# Patient Record
Sex: Female | Born: 1945 | Race: Black or African American | Hispanic: No | Marital: Married | Smoking: Never smoker
Health system: Southern US, Community
[De-identification: ages and names within clinical notes are randomized; demographics above are authoritative.]

## PROBLEM LIST (undated history)

## (undated) DIAGNOSIS — IMO0002 Reserved for concepts with insufficient information to code with codable children: Secondary | ICD-10-CM

## (undated) DIAGNOSIS — M419 Scoliosis, unspecified: Secondary | ICD-10-CM

## (undated) DIAGNOSIS — M6530 Trigger finger, unspecified finger: Secondary | ICD-10-CM

## (undated) DIAGNOSIS — T7840XA Allergy, unspecified, initial encounter: Secondary | ICD-10-CM

## (undated) DIAGNOSIS — Z1239 Encounter for other screening for malignant neoplasm of breast: Secondary | ICD-10-CM

## (undated) DIAGNOSIS — J45909 Unspecified asthma, uncomplicated: Secondary | ICD-10-CM

## (undated) DIAGNOSIS — R519 Headache, unspecified: Secondary | ICD-10-CM

## (undated) DIAGNOSIS — E215 Disorder of parathyroid gland, unspecified: Secondary | ICD-10-CM

## (undated) DIAGNOSIS — Z87440 Personal history of urinary (tract) infections: Secondary | ICD-10-CM

## (undated) DIAGNOSIS — K219 Gastro-esophageal reflux disease without esophagitis: Secondary | ICD-10-CM

## (undated) DIAGNOSIS — Z8371 Family history of colonic polyps: Secondary | ICD-10-CM

## (undated) DIAGNOSIS — G629 Polyneuropathy, unspecified: Secondary | ICD-10-CM

## (undated) DIAGNOSIS — I10 Essential (primary) hypertension: Secondary | ICD-10-CM

## (undated) DIAGNOSIS — M1990 Unspecified osteoarthritis, unspecified site: Secondary | ICD-10-CM

## (undated) DIAGNOSIS — R42 Dizziness and giddiness: Secondary | ICD-10-CM

## (undated) DIAGNOSIS — Z83719 Family history of colon polyps, unspecified: Secondary | ICD-10-CM

## (undated) DIAGNOSIS — G4730 Sleep apnea, unspecified: Secondary | ICD-10-CM

## (undated) DIAGNOSIS — G2581 Restless legs syndrome: Secondary | ICD-10-CM

## (undated) HISTORY — DX: Family history of colonic polyps: Z83.71

## (undated) HISTORY — DX: Unspecified osteoarthritis, unspecified site: M19.90

## (undated) HISTORY — DX: Restless legs syndrome: G25.81

## (undated) HISTORY — DX: Disorder of parathyroid gland, unspecified: E21.5

## (undated) HISTORY — DX: Family history of colon polyps, unspecified: Z83.719

## (undated) HISTORY — DX: Essential (primary) hypertension: I10

## (undated) HISTORY — DX: Personal history of urinary (tract) infections: Z87.440

## (undated) HISTORY — DX: Encounter for other screening for malignant neoplasm of breast: Z12.39

## (undated) HISTORY — DX: Reserved for concepts with insufficient information to code with codable children: IMO0002

## (undated) HISTORY — DX: Gastro-esophageal reflux disease without esophagitis: K21.9

## (undated) HISTORY — DX: Allergy, unspecified, initial encounter: T78.40XA

## (undated) HISTORY — PX: TUBAL LIGATION: SHX77

## (undated) HISTORY — DX: Unspecified asthma, uncomplicated: J45.909

---
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
## 1982-09-23 HISTORY — PX: TEMPOROMANDIBULAR JOINT ARTHROPLASTY: SUR76

## 1985-09-23 DIAGNOSIS — Z87440 Personal history of urinary (tract) infections: Secondary | ICD-10-CM

## 1985-09-23 HISTORY — DX: Personal history of urinary (tract) infections: Z87.440

## 1998-09-23 HISTORY — PX: CARPAL TUNNEL RELEASE: SHX101

## 2004-07-27 ENCOUNTER — Ambulatory Visit: Payer: Self-pay | Admitting: Otolaryngology

## 2005-09-03 ENCOUNTER — Ambulatory Visit: Payer: Self-pay | Admitting: Family Medicine

## 2006-01-16 ENCOUNTER — Ambulatory Visit: Payer: Self-pay | Admitting: Family Medicine

## 2006-09-23 DIAGNOSIS — E215 Disorder of parathyroid gland, unspecified: Secondary | ICD-10-CM

## 2006-09-23 HISTORY — DX: Disorder of parathyroid gland, unspecified: E21.5

## 2006-09-23 HISTORY — PX: OTHER SURGICAL HISTORY: SHX169

## 2007-02-09 ENCOUNTER — Ambulatory Visit: Payer: Self-pay | Admitting: Family Medicine

## 2007-06-12 ENCOUNTER — Ambulatory Visit: Payer: Self-pay | Admitting: Family Medicine

## 2007-08-05 ENCOUNTER — Ambulatory Visit: Payer: Self-pay | Admitting: Family Medicine

## 2008-08-08 ENCOUNTER — Ambulatory Visit: Payer: Self-pay | Admitting: Family Medicine

## 2009-05-17 ENCOUNTER — Ambulatory Visit: Payer: Self-pay | Admitting: Specialist

## 2009-10-12 ENCOUNTER — Ambulatory Visit: Payer: Self-pay | Admitting: Family Medicine

## 2010-12-04 ENCOUNTER — Ambulatory Visit: Payer: Self-pay | Admitting: Family Medicine

## 2011-06-06 ENCOUNTER — Ambulatory Visit: Payer: Self-pay | Admitting: Family Medicine

## 2012-01-27 ENCOUNTER — Ambulatory Visit: Payer: Self-pay | Admitting: Family Medicine

## 2012-06-17 ENCOUNTER — Ambulatory Visit: Payer: Self-pay | Admitting: General Surgery

## 2012-06-17 HISTORY — PX: COLONOSCOPY: SHX174

## 2012-06-18 LAB — PATHOLOGY REPORT

## 2013-02-23 ENCOUNTER — Ambulatory Visit: Payer: Self-pay | Admitting: Family Medicine

## 2013-04-07 ENCOUNTER — Encounter: Payer: Self-pay | Admitting: *Deleted

## 2013-04-07 DIAGNOSIS — Z8371 Family history of colonic polyps: Secondary | ICD-10-CM | POA: Insufficient documentation

## 2013-12-09 ENCOUNTER — Ambulatory Visit: Payer: Self-pay | Admitting: Family Medicine

## 2014-09-08 ENCOUNTER — Ambulatory Visit: Payer: Self-pay | Admitting: Family Medicine

## 2015-03-23 ENCOUNTER — Ambulatory Visit: Payer: Commercial Managed Care - HMO | Admitting: Family Medicine

## 2015-04-24 ENCOUNTER — Encounter: Payer: Self-pay | Admitting: Family Medicine

## 2015-04-24 ENCOUNTER — Ambulatory Visit (INDEPENDENT_AMBULATORY_CARE_PROVIDER_SITE_OTHER): Payer: Commercial Managed Care - HMO | Admitting: Family Medicine

## 2015-04-24 VITALS — BP 144/80 | HR 95 | Temp 98.2°F | Resp 16 | Ht 61.0 in | Wt 145.4 lb

## 2015-04-24 DIAGNOSIS — M5440 Lumbago with sciatica, unspecified side: Secondary | ICD-10-CM

## 2015-04-24 DIAGNOSIS — M8949 Other hypertrophic osteoarthropathy, multiple sites: Secondary | ICD-10-CM | POA: Insufficient documentation

## 2015-04-24 DIAGNOSIS — R609 Edema, unspecified: Secondary | ICD-10-CM | POA: Diagnosis not present

## 2015-04-24 DIAGNOSIS — M159 Polyosteoarthritis, unspecified: Secondary | ICD-10-CM | POA: Insufficient documentation

## 2015-04-24 DIAGNOSIS — M545 Low back pain, unspecified: Secondary | ICD-10-CM | POA: Insufficient documentation

## 2015-04-24 DIAGNOSIS — M150 Primary generalized (osteo)arthritis: Secondary | ICD-10-CM

## 2015-04-24 DIAGNOSIS — I10 Essential (primary) hypertension: Secondary | ICD-10-CM | POA: Insufficient documentation

## 2015-04-24 MED ORDER — HYDROCHLOROTHIAZIDE 12.5 MG PO TABS: 12.5000 mg | ORAL_TABLET | Freq: Every day | ORAL | Status: DC

## 2015-04-24 NOTE — Patient Instructions (Signed)
DASH Eating Plan °DASH stands for "Dietary Approaches to Stop Hypertension." The DASH eating plan is a healthy eating plan that has been shown to reduce high blood pressure (hypertension). Additional health benefits may include reducing the risk of type 2 diabetes mellitus, heart disease, and stroke. The DASH eating plan may also help with weight loss. °WHAT DO I NEED TO KNOW ABOUT THE DASH EATING PLAN? °For the DASH eating plan, you will follow these general guidelines: °· Choose foods with a percent daily value for sodium of less than 5% (as listed on the food label). °· Use salt-free seasonings or herbs instead of table salt or sea salt. °· Check with your health care provider or pharmacist before using salt substitutes. °· Eat lower-sodium products, often labeled as "lower sodium" or "no salt added." °· Eat fresh foods. °· Eat more vegetables, fruits, and low-fat dairy products. °· Choose whole grains. Look for the word "whole" as the first word in the ingredient list. °· Choose fish and skinless chicken or turkey more often than red meat. Limit fish, poultry, and meat to 6 oz (170 g) each day. °· Limit sweets, desserts, sugars, and sugary drinks. °· Choose heart-healthy fats. °· Limit cheese to 1 oz (28 g) per day. °· Eat more home-cooked food and less restaurant, buffet, and fast food. °· Limit fried foods. °· Cook foods using methods other than frying. °· Limit canned vegetables. If you do use them, rinse them well to decrease the sodium. °· When eating at a restaurant, ask that your food be prepared with less salt, or no salt if possible. °WHAT FOODS CAN I EAT? °Seek help from a dietitian for individual calorie needs. °Grains °Whole grain or whole wheat bread. Brown rice. Whole grain or whole wheat pasta. Quinoa, bulgur, and whole grain cereals. Low-sodium cereals. Corn or whole wheat flour tortillas. Whole grain cornbread. Whole grain crackers. Low-sodium crackers. °Vegetables °Fresh or frozen vegetables  (raw, steamed, roasted, or grilled). Low-sodium or reduced-sodium tomato and vegetable juices. Low-sodium or reduced-sodium tomato sauce and paste. Low-sodium or reduced-sodium canned vegetables.  °Fruits °All fresh, canned (in natural juice), or frozen fruits. °Meat and Other Protein Products °Ground beef (85% or leaner), grass-fed beef, or beef trimmed of fat. Skinless chicken or turkey. Ground chicken or turkey. Pork trimmed of fat. All fish and seafood. Eggs. Dried beans, peas, or lentils. Unsalted nuts and seeds. Unsalted canned beans. °Dairy °Low-fat dairy products, such as skim or 1% milk, 2% or reduced-fat cheeses, low-fat ricotta or cottage cheese, or plain low-fat yogurt. Low-sodium or reduced-sodium cheeses. °Fats and Oils °Tub margarines without trans fats. Light or reduced-fat mayonnaise and salad dressings (reduced sodium). Avocado. Safflower, olive, or canola oils. Natural peanut or almond butter. °Other °Unsalted popcorn and pretzels. °The items listed above may not be a complete list of recommended foods or beverages. Contact your dietitian for more options. °WHAT FOODS ARE NOT RECOMMENDED? °Grains °Tatro bread. Terrance pasta. Mateus rice. Refined cornbread. Bagels and croissants. Crackers that contain trans fat. °Vegetables °Creamed or fried vegetables. Vegetables in a cheese sauce. Regular canned vegetables. Regular canned tomato sauce and paste. Regular tomato and vegetable juices. °Fruits °Dried fruits. Canned fruit in light or heavy syrup. Fruit juice. °Meat and Other Protein Products °Fatty cuts of meat. Ribs, chicken wings, bacon, sausage, bologna, salami, chitterlings, fatback, hot dogs, bratwurst, and packaged luncheon meats. Salted nuts and seeds. Canned beans with salt. °Dairy °Whole or 2% milk, cream, half-and-half, and cream cheese. Whole-fat or sweetened yogurt. Full-fat   cheeses or blue cheese. Nondairy creamers and whipped toppings. Processed cheese, cheese spreads, or cheese  curds. °Condiments °Onion and garlic salt, seasoned salt, table salt, and sea salt. Canned and packaged gravies. Worcestershire sauce. Tartar sauce. Barbecue sauce. Teriyaki sauce. Soy sauce, including reduced sodium. Steak sauce. Fish sauce. Oyster sauce. Cocktail sauce. Horseradish. Ketchup and mustard. Meat flavorings and tenderizers. Bouillon cubes. Hot sauce. Tabasco sauce. Marinades. Taco seasonings. Relishes. °Fats and Oils °Butter, stick margarine, lard, shortening, ghee, and bacon fat. Coconut, palm kernel, or palm oils. Regular salad dressings. °Other °Pickles and olives. Salted popcorn and pretzels. °The items listed above may not be a complete list of foods and beverages to avoid. Contact your dietitian for more information. °WHERE CAN I FIND MORE INFORMATION? °National [employer], [employer], [employer]: www.nhlbi.nih.gov/health/health-topics/topics/dash/ °Document Released: 08/29/2011 Document Revised: 01/24/2014 Document Reviewed: 07/14/2013 °ExitCare® Patient Information ©2015 [employer]. This information is not intended to replace advice given to you by your health care provider. Make sure you discuss any questions you have with your health care provider. ° °

## 2015-04-24 NOTE — Progress Notes (Signed)
Name: Ruth [name]   MRN: [id number]    DOB: October 03, 1945   Date:04/24/2015       Progress Note  Subjective  Chief Complaint  Chief Complaint  Patient presents with  . Gastrophageal Reflux  . Hypertension    Pt stated that her B/P has been running higher than normal lately    Gastrophageal Reflux She complains of heartburn. She reports no chest pain, no coughing, no nausea or no sore throat. This is a chronic problem. The current episode started more than 1 year ago. The problem occurs frequently. The problem has been unchanged. The symptoms are aggravated by certain foods. Pertinent negatives include no weight loss. She has tried a PPI for the symptoms. The treatment provided moderate relief.  Hypertension The current episode started more than 1 year ago. The problem is unchanged. The problem is controlled. Pertinent negatives include no blurred vision, chest pain, headaches, neck pain, orthopnea, palpitations or shortness of breath. Risk factors for coronary artery disease include post-menopausal state. Past treatments include calcium channel blockers. There are no compliance problems.   Arthritis Presents for follow-up visit. The disease course has been stable. She complains of pain and stiffness. Affected locations include the right knee, left knee, right wrist and left wrist (Arthritic changes of the hands and knees). Pertinent negatives include no diarrhea, dysuria, fever or weight loss. Past treatments include NSAIDs. The treatment provided moderate relief. Compliance with total regimen is 76-100%. Compliance with medications is 76-100%.  Back Pain This is a chronic problem. The current episode started more than 1 year ago. The problem occurs intermittently. The problem is unchanged. The pain is present in the lumbar spine. The quality of the pain is described as aching and shooting. The symptoms are aggravated by bending and twisting. Pertinent negatives include no bladder incontinence,  bowel incontinence, chest pain, dysuria, fever, headaches, tingling, weakness or weight loss. She has tried NSAIDs for the symptoms. The treatment provided mild relief.      Past Medical History  Diagnosis Date  . Allergy     mycins; cause diarrhea  . Arthritis     since 2007  . Hypertension     since 2005  . Family history of colonic polyps   . Ulcer 1966  . 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 History of cystitis 1987  . Breast screening, unspecified     History  Substance Use Topics  . Smoking status: Never Smoker   . Smokeless tobacco: Not on file  . Alcohol Use: 0.0 oz/week     Comment: wine; 3-4 times per year     Current outpatient prescriptions:  .  amLODipine (NORVASC) 5 MG tablet, , Disp: , Rfl:  .  meloxicam (MOBIC) 7.5 MG tablet, Take 7.5 mg by mouth daily., Disp: , Rfl:  .  omeprazole (PRILOSEC) 40 MG capsule, , Disp: , Rfl:   No Known Allergies  Review of Systems  Constitutional: Negative for fever, chills and weight loss.  HENT: Negative for congestion, hearing loss, sore throat and tinnitus.   Eyes: Negative for blurred vision, double vision and redness.  Respiratory: Negative for cough, hemoptysis and shortness of breath.   Cardiovascular: Negative for chest pain, palpitations, orthopnea, claudication and leg swelling.  Gastrointestinal: Positive for heartburn. Negative for nausea, vomiting, diarrhea, constipation, blood in stool and bowel incontinence.  Genitourinary: Negative for bladder incontinence, dysuria, urgency, frequency and hematuria.  Musculoskeletal: Positive for back pain, joint pain, arthritis and stiffness. Negative for myalgias,  falls and neck pain.  Skin: Negative for itching.  Neurological: Negative for dizziness, tingling, tremors, focal weakness, seizures, loss of consciousness, weakness and headaches.  Endo/Heme/Allergies: Does not bruise/bleed easily.  Psychiatric/Behavioral: Negative for depression and substance abuse. The  patient is not nervous/anxious and does not have insomnia.      Objective  Filed Vitals:   04/24/15 1017  BP: 144/80  Pulse: 95  Temp: 98.2 F (36.8 C)  Resp: 16  Height: 5\' 1"  (1.549 m)  Weight: 145 lb 6 oz (65.942 kg)  SpO2: 96%     Physical Exam  Constitutional: She is oriented to person, place, and time and well-developed, well-nourished, and in no distress.  HENT:  Head: Normocephalic.  Eyes: EOM are normal. Pupils are equal, round, and reactive to light.  Neck: Normal range of motion. No thyromegaly present.  Cardiovascular: Normal rate, regular rhythm and normal heart sounds.   No murmur heard. Pulmonary/Chest: Effort normal and breath sounds normal.  Abdominal: Soft. Bowel sounds are normal.  Musculoskeletal: Normal range of motion. She exhibits no edema. Tenderness: lower lumbar areas greater on the left but markedly improved.  Neurological: She is alert and oriented to person, place, and time. No cranial nerve deficit. Gait normal.  Skin: Skin is warm and dry. No rash noted.  Psychiatric: Memory and affect normal.      Assessment & Plan   1. Essential hypertension Not at goal. Add HCTZ as below - hydrochlorothiazide (HYDRODIURIL) 12.5 MG tablet; Take 1 tablet (12.5 mg total) by mouth daily.  Dispense: 90 tablet; Refill: 3

## 2015-06-05 ENCOUNTER — Encounter: Payer: Self-pay | Admitting: Family Medicine

## 2015-06-05 ENCOUNTER — Ambulatory Visit (INDEPENDENT_AMBULATORY_CARE_PROVIDER_SITE_OTHER): Payer: Commercial Managed Care - HMO | Admitting: Family Medicine

## 2015-06-05 VITALS — BP 136/84 | HR 94 | Temp 98.1°F | Resp 16 | Ht 61.0 in | Wt 145.2 lb

## 2015-06-05 DIAGNOSIS — M1990 Unspecified osteoarthritis, unspecified site: Secondary | ICD-10-CM

## 2015-06-05 DIAGNOSIS — M5136 Other intervertebral disc degeneration, lumbar region: Secondary | ICD-10-CM

## 2015-06-05 DIAGNOSIS — Z23 Encounter for immunization: Secondary | ICD-10-CM | POA: Diagnosis not present

## 2015-06-05 DIAGNOSIS — B001 Herpesviral vesicular dermatitis: Secondary | ICD-10-CM

## 2015-06-05 DIAGNOSIS — I10 Essential (primary) hypertension: Secondary | ICD-10-CM

## 2015-06-05 NOTE — Progress Notes (Signed)
Name: Ruth [name]   MRN: [id number]    DOB: September 13, 1946   Date:06/05/2015       Progress Note  Subjective  Chief Complaint  Chief Complaint  Patient presents with  . Hypertension    pt here for 6 week follow up    Hypertension Pertinent negatives include no blurred vision, chest pain, headaches, neck pain, orthopnea, palpitations or shortness of breath.  Arthritis Pertinent negatives include no diarrhea, dysuria, fever or weight loss.    Hypertension   Patient presents for follow-up of hypertension. It has been present for over over 10 years.  Patient states that there is compliance with medical regimen which consists of amlodipine 5 mg daily and 12.5 hydrochlorothiazide daily . There is no end organ disease. Cardiac risk factors include hypertension hyperlipidemia and diabetes.  Exercise regimen consist of none .  Diet consist restricted salt and fat.   Osteoarthritis  Patient has a long-standing history of osteoarthritis. It is manifested mainly by pain in the knees and hands and back. She currently is being treated meloxicam. The pain is mild to moderate in severity. There is no associated problem with dry eyes dry mouth dysuria rash fatigue uveitis weight loss. Compliance is 76-100%. No side effects are noted.      Past Medical History  Diagnosis Date  . Allergy     mycins; cause diarrhea  . Arthritis     since 2007  . Hypertension     since 2005  . Family history of colonic polyps   . Ulcer 1966  . 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 History of cystitis 1987  . Breast screening, unspecified     Social History  Substance Use Topics  . Smoking status: Never Smoker   . Smokeless tobacco: Not on file  . Alcohol Use: 0.0 oz/week     Comment: wine; 3-4 times per year     Current outpatient prescriptions:  .  amLODipine (NORVASC) 5 MG tablet, , Disp: , Rfl:  .  hydrochlorothiazide (HYDRODIURIL) 12.5 MG tablet, Take 1 tablet (12.5 mg total) by mouth daily.,  Disp: 90 tablet, Rfl: 3 .  meloxicam (MOBIC) 7.5 MG tablet, Take 7.5 mg by mouth daily., Disp: , Rfl:  .  omeprazole (PRILOSEC) 40 MG capsule, , Disp: , Rfl:   No Known Allergies  Review of Systems  Constitutional: Negative for fever, chills and weight loss.  HENT: Negative for congestion, hearing loss, sore throat and tinnitus.   Eyes: Negative for blurred vision, double vision and redness.  Respiratory: Negative for cough, hemoptysis and shortness of breath.   Cardiovascular: Negative for chest pain, palpitations, orthopnea, claudication and leg swelling.  Gastrointestinal: Negative for heartburn, nausea, vomiting, diarrhea, constipation and blood in stool.  Genitourinary: Negative for dysuria, urgency, frequency and hematuria.  Musculoskeletal: Positive for back pain and arthritis. Negative for myalgias, joint pain, falls and neck pain.  Skin: Negative for itching.  Neurological: Negative for dizziness, tingling, tremors, focal weakness, seizures, loss of consciousness, weakness and headaches.  Endo/Heme/Allergies: Does not bruise/bleed easily.  Psychiatric/Behavioral: Negative for depression and substance abuse. The patient is not nervous/anxious and does not have insomnia.      Objective  Filed Vitals:   06/05/15 0831  BP: 136/84  Pulse: 94  Temp: 98.1 F (36.7 C)  Resp: 16  Height: 5\' 1"  (1.549 m)  Weight: 145 lb 4 oz (65.885 kg)  SpO2: 97%     Physical Exam  Constitutional: She is oriented to  person, place, and time and well-developed, well-nourished, and in no distress.  HENT:  Head: Normocephalic.  Eyes: EOM are normal. Pupils are equal, round, and reactive to light.  Neck: Normal range of motion. No thyromegaly present.  Cardiovascular: Normal rate, regular rhythm and normal heart sounds.   No murmur heard. Pulmonary/Chest: Effort normal and breath sounds normal.  Abdominal: Soft. Bowel sounds are normal.  Musculoskeletal: Normal range of motion. She exhibits  no edema.  Neurological: She is alert and oriented to person, place, and time. No cranial nerve deficit. Gait normal.  Skin: Skin is warm and dry. No rash noted.  Psychiatric: Memory and affect normal.      Assessment & Plan  1. Essential hypertension Well-controlled - Comprehensive Metabolic Panel (CMET) - Lipid Profile - TSH 2. Need for influenza vaccination Given today she will use of she will use over-the-counter meds which have been effective - Flu vaccine HIGH DOSE PF (Fluzone High dose)  3. Herpes labialis - fluticasone (FLONASE) 50 MCG/ACT nasal spray; Place 2 sprays into both nostrils daily.  Dispense: 16 g; Refill: 6  4. DDD (degenerative disc disease), lumbar Continue current regimen which is working well  5. Osteoarthritis, unspecified osteoarthritis type, unspecified site Continue current regimen

## 2015-06-06 ENCOUNTER — Encounter: Payer: Self-pay | Admitting: Family Medicine

## 2015-06-07 LAB — LIPID PANEL
Chol/HDL Ratio: 3.4 ratio units (ref 0.0–4.4)
Cholesterol, Total: 165 mg/dL (ref 100–199)
HDL: 48 mg/dL (ref >39)
LDL Calculated: 93 mg/dL (ref 0–99)
Triglycerides: 121 mg/dL (ref 0–149)
VLDL Cholesterol Cal: 24 mg/dL (ref 5–40)

## 2015-06-07 LAB — COMPREHENSIVE METABOLIC PANEL
ALT: 24 IU/L (ref 0–32)
AST: 30 IU/L (ref 0–40)
Albumin/Globulin Ratio: 1.6 (ref 1.1–2.5)
Albumin: 4.4 g/dL (ref 3.6–4.8)
Alkaline Phosphatase: 65 IU/L (ref 39–117)
BUN/Creatinine Ratio: 16 (ref 11–26)
BUN: 13 mg/dL (ref 8–27)
Bilirubin Total: 0.5 mg/dL (ref 0.0–1.2)
CO2: 24 mmol/L (ref 18–29)
Calcium: 9.8 mg/dL (ref 8.7–10.3)
Chloride: 100 mmol/L (ref 97–108)
Creatinine, Ser: 0.82 mg/dL (ref 0.57–1.00)
GFR calc Af Amer: 85 mL/min/{1.73_m2} (ref >59)
GFR calc non Af Amer: 74 mL/min/{1.73_m2} (ref >59)
Globulin, Total: 2.7 g/dL (ref 1.5–4.5)
Glucose: 94 mg/dL (ref 65–99)
Potassium: 3.6 mmol/L (ref 3.5–5.2)
Sodium: 141 mmol/L (ref 134–144)
Total Protein: 7.1 g/dL (ref 6.0–8.5)

## 2015-06-07 LAB — TSH: TSH: 0.837 u[IU]/mL (ref 0.450–4.500)

## 2015-07-21 ENCOUNTER — Telehealth: Payer: Self-pay | Admitting: Family Medicine

## 2015-07-21 NOTE — Telephone Encounter (Signed)
Pt has an appt at [facility] on [address] in [place]. Pt has an appt on 08/01/15 and she has [employer].

## 2015-07-27 NOTE — Telephone Encounter (Signed)
[employer] referral obtained for Dr. Patrici Ranks Auth# [id number]  Start - 08/01/15 Expires - 10/31/15

## 2015-10-04 ENCOUNTER — Other Ambulatory Visit: Payer: Self-pay | Admitting: Family Medicine

## 2015-10-05 ENCOUNTER — Encounter: Payer: Self-pay | Admitting: Family Medicine

## 2015-10-05 ENCOUNTER — Ambulatory Visit (INDEPENDENT_AMBULATORY_CARE_PROVIDER_SITE_OTHER): Payer: Commercial Managed Care - HMO | Admitting: Family Medicine

## 2015-10-05 VITALS — BP 128/76 | HR 96 | Temp 98.5°F | Resp 16 | Ht 61.0 in | Wt 145.3 lb

## 2015-10-05 DIAGNOSIS — I10 Essential (primary) hypertension: Secondary | ICD-10-CM | POA: Diagnosis not present

## 2015-10-05 DIAGNOSIS — M8949 Other hypertrophic osteoarthropathy, multiple sites: Secondary | ICD-10-CM

## 2015-10-05 DIAGNOSIS — H8110 Benign paroxysmal vertigo, unspecified ear: Secondary | ICD-10-CM | POA: Diagnosis not present

## 2015-10-05 DIAGNOSIS — M150 Primary generalized (osteo)arthritis: Secondary | ICD-10-CM

## 2015-10-05 DIAGNOSIS — M159 Polyosteoarthritis, unspecified: Secondary | ICD-10-CM

## 2015-10-05 DIAGNOSIS — M51369 Other intervertebral disc degeneration, lumbar region without mention of lumbar back pain or lower extremity pain: Secondary | ICD-10-CM

## 2015-10-05 DIAGNOSIS — R9431 Abnormal electrocardiogram [ECG] [EKG]: Secondary | ICD-10-CM

## 2015-10-05 DIAGNOSIS — M5136 Other intervertebral disc degeneration, lumbar region: Secondary | ICD-10-CM | POA: Diagnosis not present

## 2015-10-05 MED ORDER — AMLODIPINE BESYLATE 10 MG PO TABS: 10.0000 mg | ORAL_TABLET | Freq: Every day | ORAL | Status: DC

## 2015-10-05 NOTE — Progress Notes (Signed)
Name: Ruth [name]   MRN: [id number]    DOB: 1946-02-18   Date:10/05/2015       Progress Note  Subjective  Chief Complaint  Chief Complaint  Patient presents with  . Hypertension    4 months  . Back Pain    HPI  Hypertension   Patient presents for follow-up of hypertension. It has been present for over 5 years.  Patient states that there is compliance with medical regimen which consists of amlodipine 5 mg daily and hydrochlorothiazide 12.5 mg daily . There is no end organ disease. Cardiac risk factors include hypertension hyperlipidemia and diabetes.  Exercise regimen consist of some walking .  Diet consist of salt restriction patient has intermittent elevation of her blood pressure as high as 160 at times and this is associated with headaches and palpitations and no chest pain. .  Back pain history of present illness  Patient has a long-standing history of lower back discomfort secondary to degenerative disc disease. Her current medical regimen consist of meloxicam and occasionally has used some hydrocodone. The pain is mild to moderate in severity and worse with certain movements lying down. Radiates to the lower extremities on occasion. Occasionally it does keep her awake at night. She's had x-rays and MRI recently and is not ready for any further treatment modalities.  Osteoarthritis subjective complaint is addition to her back pain and some knee and hand pain. This usually responds to the meloxicam.  Vertigo probable benign positional vertigo  Patient has intermittent problem with rotational dizziness. It sometimes has been associated with nausea and vomiting. It is been less severe in recent years. There is no antecedent problem that precipitates significant. She often feels as if she's falling to one side when it occurs. There is no associated chest pain or irregular heartbeat or focal weakness with these episodes. They have been well-controlled B MECLIZINE PRESCRIBED BY HER ENT  AND SHE DOES A HALLPIKE MANEUVER AT HOME TO TREAT IT AT HOME AS WELL.  Chest pain  Patient is intermittently experience some mild anterior chest discomfort. It is without radiation. Occasionally there is some associated palpitations but no shortness of breath or nausea or vomiting. There is no history of cardiac disease. She is hypertensive but has no other significant risk factors.  Past Medical History  Diagnosis Date  . Allergy     mycins; cause diarrhea  . Arthritis     since 2007  . Hypertension     since 2005  . Family history of colonic polyps   . Ulcer 1966  . Parathyroid disorder [facility]) 2008    surgery  . History of cystitis 1987  . Breast screening, unspecified     Social History  Substance Use Topics  . Smoking status: Never Smoker   . Smokeless tobacco: Not on file  . Alcohol Use: 0.0 oz/week     Comment: wine; 3-4 times per year     Current outpatient prescriptions:  .  amLODipine (NORVASC) 5 MG tablet, , Disp: , Rfl:  .  hydrochlorothiazide (HYDRODIURIL) 12.5 MG tablet, Take 1 tablet (12.5 mg total) by mouth daily., Disp: 90 tablet, Rfl: 3 .  meloxicam (MOBIC) 7.5 MG tablet, Take 7.5 mg by mouth daily., Disp: , Rfl:  .  omeprazole (PRILOSEC) 40 MG capsule, , Disp: , Rfl:   No Known Allergies  Review of Systems  Constitutional: Negative for fever, chills and weight loss.  HENT: Negative for congestion, hearing loss, sore throat and tinnitus.  Eyes: Negative for blurred vision, double vision and redness.  Respiratory: Negative for cough, hemoptysis and shortness of breath.   Cardiovascular: Positive for chest pain and palpitations. Negative for orthopnea, claudication and leg swelling.  Gastrointestinal: Negative for heartburn, nausea, vomiting, diarrhea, constipation and blood in stool.  Genitourinary: Negative for dysuria, urgency, frequency and hematuria.  Musculoskeletal: Positive for back pain and joint pain. Negative for myalgias, falls and neck pain.   Skin: Negative for itching.  Neurological: Positive for dizziness and headaches. Negative for tingling, tremors, focal weakness, seizures, loss of consciousness and weakness.  Endo/Heme/Allergies: Does not bruise/bleed easily.  Psychiatric/Behavioral: Negative for depression and substance abuse. The patient is not nervous/anxious and does not have insomnia.      Objective  Filed Vitals:   10/05/15 0736  BP: 128/76  Pulse: 96  Temp: 98.5 F (36.9 C)  TempSrc: Oral  Resp: 16  Height: 5\' 1"  (1.549 m)  Weight: 145 lb 4.8 oz (65.908 kg)  SpO2: 96%     Physical Exam  Constitutional: She is oriented to person, place, and time and well-developed, well-nourished, and in no distress.  HENT:  Head: Normocephalic.  Eyes: EOM are normal. Pupils are equal, round, and reactive to light.  Neck: Normal range of motion. No thyromegaly present.  Cardiovascular: Normal rate, regular rhythm and normal heart sounds.   No murmur heard. Pulmonary/Chest: Effort normal and breath sounds normal.  Abdominal: Soft. Bowel sounds are normal.  Musculoskeletal: She exhibits tenderness (or lumbar sacral area). She exhibits no edema.  Neurological: She is alert and oriented to person, place, and time. No cranial nerve deficit. Gait normal.  Skin: Skin is warm and dry. No rash noted.  Psychiatric: Memory and affect normal.      Assessment & Plan   1. Essential hypertension Not at goal increase amlodipine from 5-10 mg daily - amLODipine (NORVASC) 10 MG tablet; Take 1 tablet (10 mg total) by mouth daily.  Dispense: 90 tablet; Refill: 1 - EKG 12-Lead  2. Primary osteoarthritis involving multiple joints Continue meloxicam  3. Degenerative disc disease, lumbar Continue meloxicam  4. Benign positional vertigo, unspecified laterality Continue using the Hallpike maneuver self-administered as this is been effective  5. Abnormal EKG We'll need stress testing with consideration of imaging - Ambulatory  referral to Cardiology

## 2015-10-10 ENCOUNTER — Other Ambulatory Visit: Payer: Self-pay | Admitting: Family Medicine

## 2015-10-10 DIAGNOSIS — I10 Essential (primary) hypertension: Secondary | ICD-10-CM

## 2015-10-10 MED ORDER — HYDROCHLOROTHIAZIDE 12.5 MG PO TABS: 12.5000 mg | ORAL_TABLET | Freq: Every day | ORAL | Status: DC

## 2015-10-10 MED ORDER — MELOXICAM 7.5 MG PO TABS: 7.5000 mg | ORAL_TABLET | Freq: Two times a day (BID) | ORAL | Status: DC

## 2015-10-10 NOTE — Telephone Encounter (Signed)
Patient called regarding Amlodipine script to be clarified at [facility]. Called [employer] and D/d the 5 mg and gave them new dosage at 10 mg. Gave refill on Meloxicam.

## 2015-10-16 ENCOUNTER — Other Ambulatory Visit: Payer: Self-pay | Admitting: Family Medicine

## 2015-11-15 ENCOUNTER — Ambulatory Visit: Payer: Self-pay | Admitting: Cardiology

## 2015-11-15 ENCOUNTER — Encounter (INDEPENDENT_AMBULATORY_CARE_PROVIDER_SITE_OTHER): Payer: Self-pay

## 2015-11-15 ENCOUNTER — Telehealth: Payer: Self-pay | Admitting: Family Medicine

## 2015-11-15 NOTE — Telephone Encounter (Signed)
LVM on 11/15/15 @ 4pm for patient to call back so that we can inform her that she will be seeing Dr. Bryan Lemma on 12/20/15 @ 2:15pm.  And that the [facility] referral has been placed.

## 2015-11-22 ENCOUNTER — Ambulatory Visit: Payer: Commercial Managed Care - HMO | Admitting: Cardiology

## 2015-11-28 ENCOUNTER — Ambulatory Visit: Payer: Self-pay | Admitting: Cardiovascular Disease

## 2015-12-05 ENCOUNTER — Other Ambulatory Visit: Payer: Self-pay | Admitting: Family Medicine

## 2015-12-07 ENCOUNTER — Other Ambulatory Visit: Payer: Self-pay | Admitting: Family Medicine

## 2015-12-20 ENCOUNTER — Ambulatory Visit: Payer: Commercial Managed Care - HMO | Admitting: Cardiology

## 2016-01-03 ENCOUNTER — Ambulatory Visit: Payer: Commercial Managed Care - HMO | Admitting: Family Medicine

## 2016-01-09 ENCOUNTER — Other Ambulatory Visit: Payer: Self-pay | Admitting: Family Medicine

## 2016-02-12 ENCOUNTER — Other Ambulatory Visit: Payer: Self-pay | Admitting: Family Medicine

## 2016-02-21 ENCOUNTER — Other Ambulatory Visit: Payer: Self-pay | Admitting: Family Medicine

## 2016-03-11 ENCOUNTER — Encounter: Payer: Self-pay | Admitting: Family Medicine

## 2016-03-11 ENCOUNTER — Ambulatory Visit (INDEPENDENT_AMBULATORY_CARE_PROVIDER_SITE_OTHER): Payer: Commercial Managed Care - HMO | Admitting: Family Medicine

## 2016-03-11 VITALS — BP 118/70 | HR 87 | Temp 98.1°F | Resp 16 | Ht 61.0 in | Wt 142.6 lb

## 2016-03-11 DIAGNOSIS — J069 Acute upper respiratory infection, unspecified: Secondary | ICD-10-CM | POA: Diagnosis not present

## 2016-03-11 DIAGNOSIS — M5136 Other intervertebral disc degeneration, lumbar region: Secondary | ICD-10-CM

## 2016-03-11 DIAGNOSIS — Z8639 Personal history of other endocrine, nutritional and metabolic disease: Secondary | ICD-10-CM | POA: Diagnosis not present

## 2016-03-11 DIAGNOSIS — R059 Cough, unspecified: Secondary | ICD-10-CM

## 2016-03-11 DIAGNOSIS — M810 Age-related osteoporosis without current pathological fracture: Secondary | ICD-10-CM | POA: Insufficient documentation

## 2016-03-11 DIAGNOSIS — K219 Gastro-esophageal reflux disease without esophagitis: Secondary | ICD-10-CM | POA: Diagnosis not present

## 2016-03-11 DIAGNOSIS — I10 Essential (primary) hypertension: Secondary | ICD-10-CM

## 2016-03-11 DIAGNOSIS — R05 Cough: Secondary | ICD-10-CM | POA: Diagnosis not present

## 2016-03-11 DIAGNOSIS — Z9181 History of falling: Secondary | ICD-10-CM

## 2016-03-11 DIAGNOSIS — Z8719 Personal history of other diseases of the digestive system: Secondary | ICD-10-CM

## 2016-03-11 DIAGNOSIS — M51369 Other intervertebral disc degeneration, lumbar region without mention of lumbar back pain or lower extremity pain: Secondary | ICD-10-CM

## 2016-03-11 DIAGNOSIS — Z8711 Personal history of peptic ulcer disease: Secondary | ICD-10-CM

## 2016-03-11 MED ORDER — AMLODIPINE BESYLATE 10 MG PO TABS: 5.0000 mg | ORAL_TABLET | Freq: Every day | ORAL | Status: DC

## 2016-03-11 MED ORDER — FLUTICASONE FUROATE-VILANTEROL 100-25 MCG/INH IN AEPB: 1.0000 | INHALATION_SPRAY | Freq: Every day | RESPIRATORY_TRACT | Status: DC

## 2016-03-11 MED ORDER — BENZONATATE 100 MG PO CAPS: 100.0000 mg | ORAL_CAPSULE | Freq: Three times a day (TID) | ORAL | Status: DC | PRN

## 2016-03-11 NOTE — Progress Notes (Signed)
Name: Ruth [name]   MRN: [id number]    DOB: 23-Dec-1945   Date:03/11/2016       Progress Note  Subjective  Chief Complaint  Chief Complaint  Patient presents with  . Medication Refill  . Osteoarthritis  . Degenrative Disc Disease  . Hypertension  . Cough    patient stated that the cough persists even after changing her BP medication.  . Orders    mammogram & colorguard  . Labs Only    last labs were 9/13/216    HPI  Chronic low back pain: she has a history of chronic low back pain, taking Meloxicam 7.5 mg daily and goes to chiropractor, but 3 weeks ago, she fell inside her own trash can and had a little flare, but is doing better now, pain is described as aching, dull, no radiculitis.   HTN: bp today is toward low end of normal, we will adjust bp, no chest pain or palpitation.   GERD: taking Omeprazole, she states she very seldom has heartburn, no regurgitation, usually triggered by dietary indiscretion. She has a history of GI bleed.   URI/Cough: she states she has rhinorrhea, post-nasal drainage and a dry cough that is worse in am's and when she goes to bed, no SOB, but seems to be triggered by going outside or when she is eating. Son and husband with similar symptoms. She has been taking Zyrtec without help of symptoms.    Patient Active Problem List   Diagnosis Date Noted  . GERD (gastroesophageal reflux disease) 03/11/2016  . History of hyperparathyroidism 03/11/2016  . Osteoporosis 03/11/2016  . History of gastric ulcer 03/11/2016  . Essential hypertension 04/24/2015  . Primary osteoarthritis involving multiple joints 04/24/2015  . Lumbago 04/24/2015  . Edema 04/24/2015  . Family history of colonic polyps     Past Surgical History  Procedure Laterality Date  . Carpal tunnel release Bilateral 2000  . Temporomandibular joint arthroplasty Left 1984    1989,1990-bilateral  . Parathyroid surgery  2008  . Colonoscopy  06/17/2012    A few two mm ulcers were found in  the sigmoid colon, no bleeding present,bx taken-path reporet on the few tiny ulcers ,non specific    History reviewed. No pertinent family history.  Social History   Social History  . Marital Status: Married    Spouse Name: N/A  . Number of Children: N/A  . Years of Education: N/A   Occupational History  . Not on file.   Social History Main Topics  . Smoking status: Never Smoker   . Smokeless tobacco: Not on file  . Alcohol Use: 0.0 oz/week     Comment: wine; 3-4 times per year  . Drug Use: No  . Sexual Activity: Not on file   Other Topics Concern  . Not on file   Social History Narrative     Current outpatient prescriptions:  .  amLODipine (NORVASC) 10 MG tablet, Take 0.5 tablets (5 mg total) by mouth daily., Disp: 90 tablet, Rfl: 0 .  Ascorbic Acid (VITAMIN C) 1000 MG tablet, Take by mouth., Disp: , Rfl:  .  Calcium-Vitamin D 600-200 MG-UNIT tablet, Take by mouth., Disp: , Rfl:  .  Cholecalciferol (VITAMIN D3) 1000 units CAPS, Take by mouth., Disp: , Rfl:  .  hydrochlorothiazide (HYDRODIURIL) 12.5 MG tablet, TAKE 1 TABLET EVERY DAY, Disp: 90 tablet, Rfl: 1 .  L-Lysine 500 MG TABS, Take by mouth., Disp: , Rfl:  .  magnesium oxide (MAG-OX) 400 MG tablet,  Take by mouth., Disp: , Rfl:  .  meloxicam (MOBIC) 7.5 MG tablet, TAKE 1 TABLET TWICE DAILY WITH FOOD, Disp: 180 tablet, Rfl: 0 .  Nutritional Supplements (JOINT FORMULA PO), Take by mouth., Disp: , Rfl:  .  omeprazole (PRILOSEC) 40 MG capsule, TAKE 1 CAPSULE  DAILY, Disp: 90 capsule, Rfl: 3 .  Potassium Gluconate (CVS POTASSIUM GLUCONATE) 2 MEQ TABS, Take by mouth., Disp: , Rfl:  .  benzonatate (TESSALON) 100 MG capsule, Take 1-2 capsules (100-200 mg total) by mouth 3 (three) times daily as needed for cough., Disp: 40 capsule, Rfl: 0 .  fluticasone furoate-vilanterol (BREO ELLIPTA) 100-25 MCG/INH AEPB, Inhale 1 puff into the lungs daily., Disp: 60 each, Rfl: 0  Allergies  Allergen Reactions  . Aspirin Nausea Only      ROS  Ten systems reviewed and is negative except as mentioned in HPI   Objective  Filed Vitals:   03/11/16 1519  BP: 118/70  Pulse: 87  Temp: 98.1 F (36.7 C)  TempSrc: Oral  Resp: 16  Height: 5\' 1"  (1.549 m)  Weight: 142 lb 9.6 oz (64.683 kg)  SpO2: 96%    Body mass index is 26.96 kg/(m^2).  Physical Exam  Constitutional: Patient appears well-developed and well-nourished. Overweight No distress.  HEENT: head atraumatic, normocephalic, pupils equal and reactive to light, ears normal TM bilaterally, neck supple, throat within normal limits Cardiovascular: Normal rate, regular rhythm and normal heart sounds.  No murmur heard. No BLE edema. Pulmonary/Chest: Effort normal and breath sounds normal. No respiratory distress. Abdominal: Soft.  There is no tenderness. Psychiatric: Patient has a normal mood and affect. behavior is normal. Judgment and thought content normal.  PHQ2/9: Depression screen [facility] 2/9 03/11/2016 10/05/2015 06/05/2015 04/24/2015  Decreased Interest 0 0 0 0  Down, Depressed, Hopeless 0 0 0 0  PHQ - 2 Score 0 0 0 0    Fall Risk: Fall Risk  03/11/2016 10/05/2015 06/05/2015 04/24/2015  Falls in the past year? Yes No No No  Number falls in past yr: 1 - - -  Injury with Fall? No - - -      Functional Status Survey: Is the patient deaf or have difficulty hearing?: No Does the patient have difficulty seeing, even when wearing glasses/contacts?: No Does the patient have difficulty concentrating, remembering, or making decisions?: No Does the patient have difficulty walking or climbing stairs?: No Does the patient have difficulty dressing or bathing?: No Does the patient have difficulty doing errands alone such as visiting a doctor's office or shopping?: No    Assessment & Plan  1. Essential hypertension  We will decrease dose to 5 mg daily instead of 10  Mg since bp is towards low end of normal  - amLODipine (NORVASC) 10 MG tablet; Take 0.5 tablets (5  mg total) by mouth daily.  Dispense: 90 tablet; Refill: 0  2. DDD (degenerative disc disease), lumbar  Stable , taking Meloxicam once daily and also seeing Dr. 06/24/2015   3. Gastroesophageal reflux disease without esophagitis  Continue medication, discussed possible risks, but she has history of GI bleed and is afraid of being without medication   4. History of hyperparathyroidism  S/p parathyroidectomy  5. History of gastric ulcer  History of bleeding many years ago  6. Upper respiratory infection  Advised Neti Pot  7. Cough  She states son and husband also have similar symptoms and it happens every year, we will try Breo and Tessalon, return sooner if needed - fluticasone  furoate-vilanterol (BREO ELLIPTA) 100-25 MCG/INH AEPB; Inhale 1 puff into the lungs daily.  Dispense: 60 each; Refill: 0 - benzonatate (TESSALON) 100 MG capsule; Take 1-2 capsules (100-200 mg total) by mouth 3 (three) times daily as needed for cough.  Dispense: 40 capsule; Refill: 0   8. History of recent fall  Was reaching inside the trash can and fell in, got a little worsening symptoms of back pain but is doing well now

## 2016-04-17 ENCOUNTER — Ambulatory Visit (INDEPENDENT_AMBULATORY_CARE_PROVIDER_SITE_OTHER): Payer: Commercial Managed Care - HMO | Admitting: Family Medicine

## 2016-04-17 ENCOUNTER — Encounter: Payer: Self-pay | Admitting: Family Medicine

## 2016-04-17 ENCOUNTER — Other Ambulatory Visit: Payer: Self-pay | Admitting: Family Medicine

## 2016-04-17 VITALS — BP 124/78 | HR 86 | Temp 98.0°F | Resp 18 | Ht 61.0 in | Wt 142.0 lb

## 2016-04-17 DIAGNOSIS — R05 Cough: Secondary | ICD-10-CM

## 2016-04-17 DIAGNOSIS — R059 Cough, unspecified: Secondary | ICD-10-CM

## 2016-04-17 DIAGNOSIS — R600 Localized edema: Secondary | ICD-10-CM

## 2016-04-17 DIAGNOSIS — I10 Essential (primary) hypertension: Secondary | ICD-10-CM

## 2016-04-17 DIAGNOSIS — G2581 Restless legs syndrome: Secondary | ICD-10-CM | POA: Diagnosis not present

## 2016-04-17 DIAGNOSIS — H8110 Benign paroxysmal vertigo, unspecified ear: Secondary | ICD-10-CM | POA: Diagnosis not present

## 2016-04-17 MED ORDER — AMLODIPINE BESYLATE 5 MG PO TABS: 5.0000 mg | ORAL_TABLET | Freq: Every day | ORAL | 1 refills | Status: DC

## 2016-04-17 MED ORDER — VALSARTAN-HYDROCHLOROTHIAZIDE 160-12.5 MG PO TABS: 1.0000 | ORAL_TABLET | Freq: Every day | ORAL | 1 refills | Status: DC

## 2016-04-17 NOTE — Progress Notes (Signed)
Name: Ruth [name]   MRN: [id number]    DOB: [birth year]   Date:04/17/2016       Progress Note  Subjective  Chief Complaint  Chief Complaint  Patient presents with  . Follow-up    1 month   . Hypertension    Changed Amlodipine to 5 mg daily on last visit, but patient states she was unable to split them due to them falling apart. So patient is taking the whole pill 10 mg daily, patient has had left ankle swelling. Also patient states on Friday night 04/12/16 she experienced a headache, dizziness, left arm pain, nause and dry mouth- but states she is allergy to aspirin.   . Dizziness    Patient states she has Vertigo but has not had a spell in while and medication was too old to take. Needs medication refilled.     HPI  HTN: she states she was unable to split Norvasc dose in half. She states she has also noticed swelling on left, but left worse than right. She initially noticed after dose was increased from 5 to 10 mg. It was significant last week, and was having leg cramps. She is doing well now. Discussed changing medication to Diovan, HCTZ and lower dose of Norvasc. Elevate legs, explained Norvasc can cause swelling  BBPV: she has a long history of vertigo, she states she went to ENT a couples years ago. She had a flare last week, when moving her head in bed, but she states she did the Epley maneuver at home and symptoms resolved  Cough: resolved with Breo and Tessalon perles. She is feeling well now.   RLS: she was given medication by Dr. Thana Ates in the past, but she is too scared of taking it. She states the pain can be burning, crawling sensation on both legs, she wakes up at night periodically.   Patient Active Problem List   Diagnosis Date Noted  . BPV (benign positional vertigo) 04/17/2016  . GERD (gastroesophageal reflux disease) 03/11/2016  . History of hyperparathyroidism 03/11/2016  . Osteoporosis 03/11/2016  . History of gastric ulcer 03/11/2016  . Essential  hypertension 04/24/2015  . Primary osteoarthritis involving multiple joints 04/24/2015  . Lumbago 04/24/2015  . Edema 04/24/2015  . Family history of colonic polyps     Past Surgical History:  Procedure Laterality Date  . CARPAL TUNNEL RELEASE Bilateral 2000  . COLONOSCOPY  06/17/2012   A few two mm ulcers were found in the sigmoid colon, no bleeding present,bx taken-path reporet on the few tiny ulcers ,non specific  . parathyroid surgery  2008  . TEMPOROMANDIBULAR JOINT ARTHROPLASTY Left 1984   1989,1990-bilateral    Family History  Problem Relation Age of Onset  . Kidney disease Mother   . Hypertension Father   . Stroke Father     Social History   Social History  . Marital status: Married    Spouse name: N/A  . Number of children: N/A  . Years of education: N/A   Occupational History  . Not on file.   Social History Main Topics  . Smoking status: Never Smoker  . Smokeless tobacco: Never Used  . Alcohol use 0.0 oz/week     Comment: wine; 3-4 times per year  . Drug use: No  . Sexual activity: Not on file   Other Topics Concern  . Not on file   Social History Narrative  . No narrative on file     Current Outpatient Prescriptions:  .  Ascorbic  Acid (VITAMIN C) 1000 MG tablet, Take by mouth., Disp: , Rfl:  .  Calcium-Vitamin D 600-200 MG-UNIT tablet, Take by mouth., Disp: , Rfl:  .  Cholecalciferol (VITAMIN D3) 1000 units CAPS, Take by mouth., Disp: , Rfl:  .  L-Lysine 500 MG TABS, Take by mouth., Disp: , Rfl:  .  magnesium oxide (MAG-OX) 400 MG tablet, Take by mouth., Disp: , Rfl:  .  meloxicam (MOBIC) 7.5 MG tablet, TAKE 1 TABLET TWICE DAILY WITH FOOD, Disp: 180 tablet, Rfl: 0 .  Nutritional Supplements (JOINT FORMULA PO), Take by mouth., Disp: , Rfl:  .  omeprazole (PRILOSEC) 40 MG capsule, TAKE 1 CAPSULE  DAILY, Disp: 90 capsule, Rfl: 3 .  Potassium Gluconate ([employer]S POTASSIUM GLUCONATE) 2 MEQ TABS, Take by mouth., Disp: , Rfl:  .  amLODipine (NORVASC) 5 MG  tablet, Take 1 tablet (5 mg total) by mouth daily., Disp: 90 tablet, Rfl: 1 .  valsartan-hydrochlorothiazide (DIOVAN-HCT) 160-12.5 MG tablet, Take 1 tablet by mouth daily. In place of HCTZ, Disp: 90 tablet, Rfl: 1  Allergies  Allergen Reactions  . Aspirin Nausea Only     ROS  Ten systems reviewed and is negative except as mentioned in HPI   Objective  Vitals:   04/17/16 0927  BP: 124/78  Pulse: 86  Resp: 18  Temp: 98 F (36.7 C)  TempSrc: Oral  SpO2: 98%  Weight: 142 lb (64.4 kg)  Height: 5\' 1"  (1.549 m)    Body mass index is 26.83 kg/m.  Physical Exam  Constitutional: Patient appears well-developed and well-nourished.  No distress.  HEENT: head atraumatic, normocephalic, pupils equal and reactive to light, neck supple, throat within normal limits Cardiovascular: Normal rate, regular rhythm and normal heart sounds.  No murmur heard. BLE edema trace but slightly worse on left side. Pulmonary/Chest: Effort normal and breath sounds normal. No respiratory distress. Abdominal: Soft.  There is no tenderness. Psychiatric: Patient has a normal mood and affect. behavior is normal. Judgment and thought content normal. Neuro: no nystagmus   PHQ2/9: Depression screen [facility] 2/9 03/11/2016 10/05/2015 06/05/2015 04/24/2015  Decreased Interest 0 0 0 0  Down, Depressed, Hopeless 0 0 0 0  PHQ - 2 Score 0 0 0 0     Fall Risk: Fall Risk  03/11/2016 10/05/2015 06/05/2015 04/24/2015  Falls in the past year? Yes No No No  Number falls in past yr: 1 - - -  Injury with Fall? No - - -     Assessment & Plan  1. Essential hypertension  - valsartan-hydrochlorothiazide (DIOVAN-HCT) 160-12.5 MG tablet; Take 1 tablet by mouth daily. In place of HCTZ  Dispense: 90 tablet; Refill: 1 - amLODipine (NORVASC) 5 MG tablet; Take 1 tablet (5 mg total) by mouth daily.  Dispense: 90 tablet; Refill: 1  2. Cough  resolved  3. Bilateral edema of lower extremity  We will decrease dose of Norvasc and  elevate legs  4. BPV (benign positional vertigo), unspecified laterality  She will call back for another referral to ENT if symptoms returns  5. RLS (restless legs syndrome)  She does not want medication at this time, discussed stretching legs before bed time

## 2016-04-23 NOTE — Telephone Encounter (Signed)
Patient is not needing a refill on medication at this time

## 2016-04-24 NOTE — Telephone Encounter (Signed)
Patient informed and thanks you. She does not need it at this time.

## 2016-04-26 ENCOUNTER — Telehealth: Payer: Self-pay | Admitting: Family Medicine

## 2016-04-26 NOTE — Telephone Encounter (Signed)
lvm informing Ruth [name] that referral has been placed

## 2016-04-26 NOTE — Telephone Encounter (Signed)
Entered referral into [employer] [facility] with authorization # [id number] with 6 visits from 05/20/2016 through 11/16/2016

## 2016-06-18 ENCOUNTER — Ambulatory Visit (INDEPENDENT_AMBULATORY_CARE_PROVIDER_SITE_OTHER): Payer: Commercial Managed Care - HMO | Admitting: Family Medicine

## 2016-06-18 ENCOUNTER — Encounter: Payer: Self-pay | Admitting: Family Medicine

## 2016-06-18 VITALS — BP 116/58 | HR 91 | Temp 98.0°F | Wt 139.2 lb

## 2016-06-18 DIAGNOSIS — J3089 Other allergic rhinitis: Secondary | ICD-10-CM

## 2016-06-18 DIAGNOSIS — R05 Cough: Secondary | ICD-10-CM

## 2016-06-18 DIAGNOSIS — I10 Essential (primary) hypertension: Secondary | ICD-10-CM

## 2016-06-18 DIAGNOSIS — R058 Other specified cough: Secondary | ICD-10-CM

## 2016-06-18 DIAGNOSIS — J45998 Other asthma: Secondary | ICD-10-CM | POA: Insufficient documentation

## 2016-06-18 DIAGNOSIS — Z23 Encounter for immunization: Secondary | ICD-10-CM | POA: Diagnosis not present

## 2016-06-18 MED ORDER — MONTELUKAST SODIUM 10 MG PO TABS: 10.0000 mg | ORAL_TABLET | Freq: Every day | ORAL | 1 refills | Status: DC

## 2016-06-18 MED ORDER — FLUTICASONE PROPIONATE 50 MCG/ACT NA SUSP: 2.0000 | Freq: Every day | NASAL | 0 refills | Status: DC

## 2016-06-18 MED ORDER — FEXOFENADINE HCL 180 MG PO TABS: 180.0000 mg | ORAL_TABLET | Freq: Every day | ORAL | 1 refills | Status: DC

## 2016-06-18 MED ORDER — AMLODIPINE BESYLATE 2.5 MG PO TABS: 2.5000 mg | ORAL_TABLET | Freq: Every day | ORAL | 0 refills | Status: DC

## 2016-06-18 NOTE — Progress Notes (Signed)
Name: Ruth [name]   MRN: [id number]    DOB: Mar 19, 1946   Date:06/18/2016       Progress Note  Subjective  Chief Complaint  Chief Complaint  Patient presents with  . Follow-up    sinus infection & cough    HPI  AR/Recurrent cough: she states that for the past 10 years she has noticed recurrent episodes of cough, she states it started with lisinopril and improved for a period of time when she switched to Losartan, however she continues to have recurrent cough, that is productive initially and after cough suppressant and antibiotics and cough is than a dry cough that can last up to 4 months, usually happens during Spring and Fall. She has never been diagnosed with asthma, but she has to take inhalers and prednisone during episodes. She has noticed that certain type of flowers , smoke, strong scents such as perfumes, dust can cause severe symptoms of allergies, such as watery eyes, nasal congestion, post-nasal drainage, pruritis ( eye and nose ). She has never seen an allergist. Currently not taking any medications for allergies.  HTN: she is taking HCTZ and did not start Valsartan HCTZ yet, because she had a 90 day supply, bp is at goal   Patient Active Problem List   Diagnosis Date Noted  . 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  . BPV (benign positional vertigo) 04/17/2016  . RLS (restless legs syndrome) 04/17/2016  . GERD (gastroesophageal reflux disease) 03/11/2016  . History of hyperparathyroidism 03/11/2016  . Osteoporosis 03/11/2016  . History of gastric ulcer 03/11/2016  . Essential hypertension 04/24/2015  . Primary osteoarthritis involving multiple joints 04/24/2015  . Lumbago 04/24/2015  . Edema 04/24/2015  . Family history of colonic polyps     Past Surgical History:  Procedure Laterality Date  . CARPAL TUNNEL RELEASE Bilateral 2000  . COLONOSCOPY  06/17/2012   A few two mm ulcers were found in the sigmoid colon, no bleeding present,bx taken-path reporet on the few  tiny ulcers ,non specific  . parathyroid surgery  2008  . TEMPOROMANDIBULAR JOINT ARTHROPLASTY Left 1984   1989,1990-bilateral    Family History  Problem Relation Age of Onset  . Kidney disease Mother   . Hypertension Father   . Stroke Father     Social History   Social History  . Marital status: Married    Spouse name: N/A  . Number of children: N/A  . Years of education: N/A   Occupational History  . Not on file.   Social History Main Topics  . Smoking status: Never Smoker  . Smokeless tobacco: Never Used  . Alcohol use 0.0 oz/week     Comment: wine; 3-4 times per year  . Drug use: No  . Sexual activity: Not on file   Other Topics Concern  . Not on file   Social History Narrative  . No narrative on file     Current Outpatient Prescriptions:  .  amLODipine (NORVASC) 2.5 MG tablet, Take 1 tablet (2.5 mg total) by mouth daily., Disp: 90 tablet, Rfl: 0 .  Ascorbic Acid (VITAMIN C) 1000 MG tablet, Take by mouth., Disp: , Rfl:  .  Calcium-Vitamin D 600-200 MG-UNIT tablet, Take by mouth., Disp: , Rfl:  .  Cholecalciferol (VITAMIN D3) 1000 units CAPS, Take by mouth., Disp: , Rfl:  .  L-Lysine 500 MG TABS, Take by mouth., Disp: , Rfl:  .  magnesium oxide (MAG-OX) 400 MG tablet, Take by mouth., Disp: , Rfl:  .  Nutritional Supplements (JOINT FORMULA PO), Take by mouth., Disp: , Rfl:  .  omeprazole (PRILOSEC) 40 MG capsule, TAKE 1 CAPSULE  DAILY, Disp: 90 capsule, Rfl: 3 .  Potassium Gluconate (CVS POTASSIUM GLUCONATE) 2 MEQ TABS, Take by mouth., Disp: , Rfl:  .  fexofenadine (ALLEGRA ALLERGY) 180 MG tablet, Take 1 tablet (180 mg total) by mouth daily., Disp: 90 tablet, Rfl: 1 .  fluticasone (FLONASE) 50 MCG/ACT nasal spray, Place 2 sprays into both nostrils daily., Disp: 16 g, Rfl: 0 .  montelukast (SINGULAIR) 10 MG tablet, Take 1 tablet (10 mg total) by mouth at bedtime., Disp: 90 tablet, Rfl: 1 .  valsartan-hydrochlorothiazide (DIOVAN-HCT) 160-12.5 MG tablet, Take 1  tablet by mouth daily. In place of HCTZ (Patient not taking: Reported on 06/18/2016), Disp: 90 tablet, Rfl: 1  Allergies  Allergen Reactions  . Ace Inhibitors Cough  . Aspirin Nausea Only     ROS  Ten systems reviewed and is negative except as mentioned in HPI   Objective  Vitals:   06/18/16 1047  BP: (!) 116/58  Pulse: 91  Temp: 98 F (36.7 C)  SpO2: 96%  Weight: 139 lb 3.2 oz (63.1 kg)    Body mass index is 26.3 kg/m.  Physical Exam  Constitutional: Patient appears well-developed and well-nourished. Obese  No distress.  HEENT: head atraumatic, normocephalic, pupils equal and reactive to light, boggy turbinates,  neck supple, throat within normal limits Cardiovascular: Normal rate, regular rhythm and normal heart sounds.  No murmur heard. No BLE edema. Pulmonary/Chest: Effort normal and breath sounds normal. No respiratory distress. Abdominal: Soft.  There is no tenderness. Psychiatric: Patient has a normal mood and affect. behavior is normal. Judgment and thought content normal.  PHQ2/9: Depression screen [facility] 2/9 06/18/2016 03/11/2016 10/05/2015 06/05/2015 04/24/2015  Decreased Interest 0 0 0 0 0  Down, Depressed, Hopeless 0 0 0 0 0  PHQ - 2 Score 0 0 0 0 0     Fall Risk: Fall Risk  06/18/2016 03/11/2016 10/05/2015 06/05/2015 04/24/2015  Falls in the past year? No Yes No No No  Number falls in past yr: - 1 - - -  Injury with Fall? - No - - -      Functional Status Survey: Is the patient deaf or have difficulty hearing?: No Does the patient have difficulty seeing, even when wearing glasses/contacts?: Yes (glasses) Does the patient have difficulty concentrating, remembering, or making decisions?: No Does the patient have difficulty walking or climbing stairs?: No Does the patient have difficulty dressing or bathing?: No Does the patient have difficulty doing errands alone such as visiting a doctor's office or shopping?: No   Assessment & Plan  1. Recurrent  cough  - Ambulatory referral to Immunology - montelukast (SINGULAIR) 10 MG tablet; Take 1 tablet (10 mg total) by mouth at bedtime.  Dispense: 90 tablet; Refill: 1 - fexofenadine (ALLEGRA ALLERGY) 180 MG tablet; Take 1 tablet (180 mg total) by mouth daily.  Dispense: 90 tablet; Refill: 1 - fluticasone (FLONASE) 50 MCG/ACT nasal spray; Place 2 sprays into both nostrils daily.  Dispense: 16 g; Refill: 0  2. Other allergic rhinitis  - Ambulatory referral to Immunology - montelukast (SINGULAIR) 10 MG tablet; Take 1 tablet (10 mg total) by mouth at bedtime.  Dispense: 90 tablet; Refill: 1 - fexofenadine (ALLEGRA ALLERGY) 180 MG tablet; Take 1 tablet (180 mg total) by mouth daily.  Dispense: 90 tablet; Refill: 1 - fluticasone (FLONASE) 50 MCG/ACT nasal spray; Place 2 sprays  into both nostrils daily.  Dispense: 16 g; Refill: 0  3. Needs flu shot  - Flu vaccine HIGH DOSE PF   4. Essential hypertension  Doing well , we will decrease Norvasc to 2.5 mg daily

## 2016-07-02 ENCOUNTER — Other Ambulatory Visit: Payer: Self-pay | Admitting: Family Medicine

## 2016-07-16 ENCOUNTER — Telehealth: Payer: Self-pay | Admitting: Family Medicine

## 2016-07-16 NOTE — Telephone Encounter (Signed)
Pt wanted to make sure that valsartan and losartan were different medications

## 2016-07-16 NOTE — Telephone Encounter (Signed)
Pt states she needs a call back about Losartan that Dr Carlynn Purl put her on.

## 2016-07-23 ENCOUNTER — Ambulatory Visit: Payer: Commercial Managed Care - HMO | Admitting: Family Medicine

## 2016-09-05 ENCOUNTER — Other Ambulatory Visit: Payer: Self-pay | Admitting: Family Medicine

## 2016-09-18 ENCOUNTER — Other Ambulatory Visit: Payer: Self-pay

## 2016-09-18 NOTE — Telephone Encounter (Signed)
Patient requesting refill of Omeprazole to [facility].

## 2016-09-26 ENCOUNTER — Ambulatory Visit: Payer: Commercial Managed Care - HMO | Admitting: Family Medicine

## 2016-09-30 ENCOUNTER — Other Ambulatory Visit: Payer: Self-pay | Admitting: Family Medicine

## 2016-09-30 DIAGNOSIS — I10 Essential (primary) hypertension: Secondary | ICD-10-CM

## 2016-09-30 MED ORDER — VALSARTAN-HYDROCHLOROTHIAZIDE 160-12.5 MG PO TABS: 1.0000 | ORAL_TABLET | Freq: Every day | ORAL | 1 refills | Status: DC

## 2016-09-30 NOTE — Telephone Encounter (Signed)
Pt is requesting refill on valsart-hctz 160-12.5mg . Pt requesting that you send to [facility]

## 2016-10-18 DIAGNOSIS — H2513 Age-related nuclear cataract, bilateral: Secondary | ICD-10-CM | POA: Diagnosis not present

## 2016-10-31 ENCOUNTER — Encounter: Payer: Self-pay | Admitting: Family Medicine

## 2016-10-31 ENCOUNTER — Ambulatory Visit (INDEPENDENT_AMBULATORY_CARE_PROVIDER_SITE_OTHER): Payer: Medicare HMO | Admitting: Family Medicine

## 2016-10-31 VITALS — BP 122/76 | HR 83 | Temp 98.1°F | Resp 16 | Ht 59.5 in | Wt 145.8 lb

## 2016-10-31 DIAGNOSIS — M150 Primary generalized (osteo)arthritis: Secondary | ICD-10-CM

## 2016-10-31 DIAGNOSIS — I10 Essential (primary) hypertension: Secondary | ICD-10-CM

## 2016-10-31 DIAGNOSIS — K219 Gastro-esophageal reflux disease without esophagitis: Secondary | ICD-10-CM | POA: Diagnosis not present

## 2016-10-31 DIAGNOSIS — M85852 Other specified disorders of bone density and structure, left thigh: Secondary | ICD-10-CM | POA: Diagnosis not present

## 2016-10-31 DIAGNOSIS — M159 Polyosteoarthritis, unspecified: Secondary | ICD-10-CM

## 2016-10-31 DIAGNOSIS — M5136 Other intervertebral disc degeneration, lumbar region: Secondary | ICD-10-CM | POA: Diagnosis not present

## 2016-10-31 DIAGNOSIS — E663 Overweight: Secondary | ICD-10-CM

## 2016-10-31 DIAGNOSIS — Z0000 Encounter for general adult medical examination without abnormal findings: Secondary | ICD-10-CM

## 2016-10-31 DIAGNOSIS — E786 Lipoprotein deficiency: Secondary | ICD-10-CM | POA: Diagnosis not present

## 2016-10-31 DIAGNOSIS — Z1231 Encounter for screening mammogram for malignant neoplasm of breast: Secondary | ICD-10-CM | POA: Diagnosis not present

## 2016-10-31 DIAGNOSIS — Z79899 Other long term (current) drug therapy: Secondary | ICD-10-CM | POA: Diagnosis not present

## 2016-10-31 DIAGNOSIS — M8949 Other hypertrophic osteoarthropathy, multiple sites: Secondary | ICD-10-CM

## 2016-10-31 DIAGNOSIS — E559 Vitamin D deficiency, unspecified: Secondary | ICD-10-CM | POA: Diagnosis not present

## 2016-10-31 LAB — CBC WITH DIFFERENTIAL/PLATELET
Basophils Absolute: 0 cells/uL (ref 0–200)
Basophils Relative: 0 %
Eosinophils Absolute: 166 cells/uL (ref 15–500)
Eosinophils Relative: 2 %
HCT: 40.8 % (ref 35.0–45.0)
Hemoglobin: 13.7 g/dL (ref 11.7–15.5)
Lymphocytes Relative: 27 %
Lymphs Abs: 2241 cells/uL (ref 850–3900)
MCH: 30.8 pg (ref 27.0–33.0)
MCHC: 33.6 g/dL (ref 32.0–36.0)
MCV: 91.7 fL (ref 80.0–100.0)
MPV: 9.2 fL (ref 7.5–12.5)
Monocytes Absolute: 747 cells/uL (ref 200–950)
Monocytes Relative: 9 %
Neutro Abs: 5146 cells/uL (ref 1500–7800)
Neutrophils Relative %: 62 %
Platelets: 278 10*3/uL (ref 140–400)
RBC: 4.45 MIL/uL (ref 3.80–5.10)
RDW: 12.8 % (ref 11.0–15.0)
WBC: 8.3 10*3/uL (ref 3.8–10.8)

## 2016-10-31 MED ORDER — OMEPRAZOLE 40 MG PO CPDR: 40.0000 mg | DELAYED_RELEASE_CAPSULE | Freq: Every day | ORAL | 3 refills | Status: DC

## 2016-10-31 MED ORDER — AMLODIPINE BESYLATE 2.5 MG PO TABS: 2.5000 mg | ORAL_TABLET | Freq: Every day | ORAL | 1 refills | Status: DC

## 2016-10-31 MED ORDER — ACETAMINOPHEN 500 MG PO TABS: 500.0000 mg | ORAL_TABLET | Freq: Three times a day (TID) | ORAL | 2 refills | Status: AC | PRN

## 2016-10-31 MED ORDER — MELOXICAM 7.5 MG PO TABS: 7.5000 mg | ORAL_TABLET | Freq: Every day | ORAL | 0 refills | Status: DC

## 2016-10-31 NOTE — Patient Instructions (Signed)
  Ms. Herbel , Thank you for taking time to come for your Medicare Wellness Visit. I appreciate your ongoing commitment to your health goals. Please review the following plan we discussed and let me know if I can assist you in the future.   These are the goals we discussed:  Eat tree nuts ( pecans, pistachios, walnuts at least three times a week) Eat fish twice a week at least Exercise 30 minutes at least 5 days a week Work on your living will and power attorney of health care Get passwords from husband.  This is a list of the screening recommended for you and due dates:  Health Maintenance  Topic Date Due  . Mammogram  09/08/2016  . Tetanus Vaccine  04/13/2022  . Colon Cancer Screening  06/18/2022  . Flu Shot  Completed  . DEXA scan (bone density measurement)  Completed  . Shingles Vaccine  Completed  .  Hepatitis C: One time screening is recommended by Center for Disease Control  (CDC) for  adults born from [birth year] through 1964.   Completed  . Pneumonia vaccines  Completed

## 2016-10-31 NOTE — Progress Notes (Signed)
Name: Ruth [name]   MRN: [id number]    DOB: [birth year]   Date:10/31/2016       Progress Note  Subjective  Chief Complaint  Chief Complaint  Patient presents with  . Annual Exam    HPI  Functional ability/safety issues: No Issues Hearing issues: Addressed  Activities of daily living: Discussed Home safety issues: No Issues  End Of Life Planning: Offered verbal information regarding advanced directives, healthcare power of attorney ( she will get it updated)   Preventative care, Health maintenance, Preventative health measures discussed.  Preventative screenings discussed today: lab work, colonoscopy,  mammogram, DEXA.  Low Dose CT Chest recommended if Age 49-80 years, 30 pack-year currently smoking OR have quit w/in 15years.   Lifestyle risk factor issued reviewed: Diet, exercise, weight management, advised patient smoking is not healthy, nutrition/diet.  Preventative health measures discussed (5-10 year plan).  Reviewed and recommended vaccinations: - Pneumovax  - Prevnar  - Annual Influenza - Zostavax - Tdap   Depression screening: Done Fall risk screening: Done Discuss ADLs/IADLs: Done  Current medical providers: See HPI  Other health risk factors identified this visit: No other issues Cognitive impairment issues: None identified  All above discussed with patient. Appropriate education, counseling and referral will be made based upon the above.   HTN: patient is compliant with medication, bp is at goal, no chest pain or palpitation  Chronic allergic bronchitis: she is doing better, cough resolved, has follow up with immunologist.   Vitamin D deficiency: we will recheck levels, history of hyperparathyroidism, and at one point she was osteoporotic, but last one just showed osteopenia  GERD: she has tried weaning self off Omeprazole but it causes reflux symptoms. She is taking Meloxicam and advised to try weaning off that before trying to stop PPI, because of  long term risk of PPI  OA: she is taking Meloxicam, but explained importance of trying to change to Tylenol,. Worse symptoms is aching pain on left hip, sometimes her hands   Patient Active Problem List   Diagnosis Date Noted  . Vitamin D deficiency 10/31/2016  . Osteopenia of left hip 10/31/2016  . 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  . BPV (benign positional vertigo) 04/17/2016  . RLS (restless legs syndrome) 04/17/2016  . GERD (gastroesophageal reflux disease) 03/11/2016  . History of hyperparathyroidism 03/11/2016  . History of gastric ulcer 03/11/2016  . Essential hypertension 04/24/2015  . Primary osteoarthritis involving multiple joints 04/24/2015  . Lumbago 04/24/2015  . Family history of colonic polyps     Past Surgical History:  Procedure Laterality Date  . CARPAL TUNNEL RELEASE Bilateral 2000  . COLONOSCOPY  06/17/2012   A few two mm ulcers were found in the sigmoid colon, no bleeding present,bx taken-path reporet on the few tiny ulcers ,non specific  . parathyroid surgery  2008  . 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 TUBAL LIGATION      Family History  Problem Relation Age of Onset  . Kidney disease Mother   . Ovarian cancer Mother   . Hypertension Father   . Stroke Father   . Lupus Sister     Social History   Social History  . Marital status: Married    Spouse name: N/A  . Number of children: N/A  . Years of education: N/A   Occupational History  . Not on file.   Social History Main Topics  . Smoking status: Never Smoker  . Smokeless tobacco: Never Used  . Alcohol  use 0.0 oz/week     Comment: wine; 3-4 times per year  . Drug use: No  . Sexual activity: Yes    Partners: Male   Other Topics Concern  . Not on file   Social History Narrative  . No narrative on file     Current Outpatient Prescriptions:  .  amLODipine (NORVASC) 2.5 MG tablet, Take 1 tablet (2.5 mg total) by mouth daily., Disp: 90  tablet, Rfl: 1 .  Ascorbic Acid (VITAMIN C) 1000 MG tablet, Take by mouth., Disp: , Rfl:  .  Calcium-Vitamin D 600-200 MG-UNIT tablet, Take by mouth., Disp: , Rfl:  .  Cholecalciferol (VITAMIN D3) 1000 units CAPS, Take by mouth., Disp: , Rfl:  .  L-Lysine 500 MG TABS, Take by mouth., Disp: , Rfl:  .  magnesium oxide (MAG-OX) 400 MG tablet, Take by mouth., Disp: , Rfl:  .  meloxicam (MOBIC) 7.5 MG tablet, Take 1 tablet (7.5 mg total) by mouth daily., Disp: 90 tablet, Rfl: 0 .  Nutritional Supplements (JOINT FORMULA PO), Take by mouth., Disp: , Rfl:  .  omeprazole (PRILOSEC) 40 MG capsule, Take 1 capsule (40 mg total) by mouth daily., Disp: 90 capsule, Rfl: 3 .  Potassium Gluconate (CVS POTASSIUM GLUCONATE) 2 MEQ TABS, Take by mouth., Disp: , Rfl:  .  valsartan-hydrochlorothiazide (DIOVAN-HCT) 160-12.5 MG tablet, Take 1 tablet by mouth daily. In place of HCTZ, Disp: 90 tablet, Rfl: 1 .  acetaminophen (TYLENOL) 500 MG tablet, Take 1-2 tablets (500-1,000 mg total) by mouth every 8 (eight) hours as needed., Disp: 100 tablet, Rfl: 2  Allergies  Allergen Reactions  . Ace Inhibitors Cough  . Aspirin Nausea Only     ROS  Constitutional: Negative for fever or weight change.  Respiratory: Negative for cough and shortness of breath.   Cardiovascular: Negative for chest pain or palpitations.  Gastrointestinal: Negative for abdominal pain, no bowel changes.  Musculoskeletal: Negative for gait problem or joint swelling.  Skin: Negative for rash.  Neurological: Negative for dizziness or headache.  No other specific complaints in a complete review of systems (except as listed in HPI above).  Objective  Vitals:   10/31/16 0822  BP: 122/76  Pulse: 83  Resp: 16  Temp: 98.1 F (36.7 C)  TempSrc: Oral  SpO2: 95%  Weight: 145 lb 12.8 oz (66.1 kg)  Height: 4' 11.5" (1.511 m)    Body mass index is 28.96 kg/m.  Physical Exam  Constitutional: Patient appears well-developed and overweight . No  distress.  HENT: Head: Normocephalic and atraumatic. Ears: B TMs ok, no erythema or effusion; Nose: Nose normal. Mouth/Throat: Oropharynx is clear and moist. No oropharyngeal exudate.  Eyes: Conjunctivae and EOM are normal. Pupils are equal, round, and reactive to light. No scleral icterus.  Neck: Normal range of motion. Neck supple. No JVD present. No thyromegaly present.  Cardiovascular: Normal rate, regular rhythm and normal heart sounds.  No murmur heard. No BLE edema. Pulmonary/Chest: Effort normal and breath sounds normal. No respiratory distress. Abdominal: Soft. Bowel sounds are normal, no distension. There is no tenderness. no masses Breast: no lumps or masses, no nipple discharge or rashes FEMALE GENITALIA:  External genitalia normal External urethra normal Pelvic not done RECTAL: not done Musculoskeletal: Normal range of motion, no joint effusions. No gross deformities Neurological: he is alert and oriented to person, place, and time. No cranial nerve deficit. Coordination, balance, strength, speech and gait are normal.  Skin: Skin is warm and dry. No rash noted.  No erythema.  Psychiatric: Patient has a normal mood and affect. behavior is normal. Judgment and thought content normal.  PHQ2/9: Depression screen [facility] 2/9 10/31/2016 06/18/2016 03/11/2016 10/05/2015 06/05/2015  Decreased Interest 0 0 0 0 0  Down, Depressed, Hopeless 0 0 0 0 0  PHQ - 2 Score 0 0 0 0 0     Fall Risk: Fall Risk  10/31/2016 06/18/2016 03/11/2016 10/05/2015 06/05/2015  Falls in the past year? No No Yes No No  Number falls in past yr: - - 1 - -  Injury with Fall? - - No - -     Functional Status Survey: Is the patient deaf or have difficulty hearing?: No Does the patient have difficulty seeing, even when wearing glasses/contacts?: No Does the patient have difficulty concentrating, remembering, or making decisions?: No Does the patient have difficulty walking or climbing stairs?: No Does the patient have  difficulty dressing or bathing?: No Does the patient have difficulty doing errands alone such as visiting a doctor's office or shopping?: No    Assessment & Plan  1. Medicare annual wellness visit, subsequent  Discussed importance of 150 minutes of physical activity weekly, eat two servings of fish weekly, eat one serving of tree nuts ( cashews, pistachios, pecans, almonds.Marland Kitchen) every other day, eat 6 servings of fruit/vegetables daily and drink plenty of water and avoid sweet beverages.   2. Essential hypertension  - amLODipine (NORVASC) 2.5 MG tablet; Take 1 tablet (2.5 mg total) by mouth daily.  Dispense: 90 tablet; Refill: 1 - CBC with Differential/Platelet - COMPLETE METABOLIC PANEL WITH GFR  3. Gastroesophageal reflux disease without esophagitis  - omeprazole (PRILOSEC) 40 MG capsule; Take 1 capsule (40 mg total) by mouth daily.  Dispense: 90 capsule; Refill: 3  4. DDD (degenerative disc disease), lumbar  - meloxicam (MOBIC) 7.5 MG tablet; Take 1 tablet (7.5 mg total) by mouth daily.  Dispense: 90 tablet; Refill: 0  5. Primary osteoarthritis involving multiple joints  - meloxicam (MOBIC) 7.5 MG tablet; Take 1 tablet (7.5 mg total) by mouth daily.  Dispense: 90 tablet; Refill: 0  6. Vitamin D deficiency  - VITAMIN D 25 Hydroxy (Vit-D Deficiency, Fractures)  7. Osteopenia of left hip  - DG Bone Density; Future  8. Encounter for screening mammogram for breast cancer  - MM Digital Screening; Future  9. 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 Overweight (BMI 25.0-29.9)  - Hemoglobin A1c - Insulin, fasting

## 2016-11-01 LAB — LIPID PANEL
Cholesterol: 156 mg/dL (ref <200)
HDL: 51 mg/dL (ref >50)
LDL Cholesterol: 71 mg/dL (ref <100)
Total CHOL/HDL Ratio: 3.1 Ratio (ref <5.0)
Triglycerides: 171 mg/dL — ABNORMAL HIGH (ref <150)
VLDL: 34 mg/dL — ABNORMAL HIGH (ref <30)

## 2016-11-01 LAB — VITAMIN D 25 HYDROXY (VIT D DEFICIENCY, FRACTURES): Vit D, 25-Hydroxy: 62 ng/mL (ref 30–100)

## 2016-11-01 LAB — HEMOGLOBIN A1C
Hgb A1c MFr Bld: 5.3 % (ref <5.7)
Mean Plasma Glucose: 105 mg/dL

## 2016-11-01 LAB — COMPLETE METABOLIC PANEL WITH GFR
ALT: 16 U/L (ref 6–29)
AST: 20 U/L (ref 10–35)
Albumin: 4.5 g/dL (ref 3.6–5.1)
Alkaline Phosphatase: 48 U/L (ref 33–130)
BUN: 18 mg/dL (ref 7–25)
CO2: 24 mmol/L (ref 20–31)
Calcium: 9.7 mg/dL (ref 8.6–10.4)
Chloride: 104 mmol/L (ref 98–110)
Creat: 0.71 mg/dL (ref 0.50–0.99)
GFR, Est African American: >89 mL/min (ref >=60)
GFR, Est Non African American: 87 mL/min (ref >=60)
Glucose, Bld: 81 mg/dL (ref 65–99)
Potassium: 3.7 mmol/L (ref 3.5–5.3)
Sodium: 142 mmol/L (ref 135–146)
Total Bilirubin: 0.5 mg/dL (ref 0.2–1.2)
Total Protein: 7.3 g/dL (ref 6.1–8.1)

## 2016-11-01 LAB — INSULIN, FASTING: Insulin fasting, serum: 13.7 u[IU]/mL (ref 2.0–19.6)

## 2016-11-07 DIAGNOSIS — J309 Allergic rhinitis, unspecified: Secondary | ICD-10-CM | POA: Diagnosis not present

## 2016-11-07 DIAGNOSIS — R05 Cough: Secondary | ICD-10-CM | POA: Diagnosis not present

## 2016-11-25 DIAGNOSIS — M9903 Segmental and somatic dysfunction of lumbar region: Secondary | ICD-10-CM | POA: Diagnosis not present

## 2016-11-25 DIAGNOSIS — M62830 Muscle spasm of back: Secondary | ICD-10-CM | POA: Diagnosis not present

## 2016-11-25 DIAGNOSIS — M5431 Sciatica, right side: Secondary | ICD-10-CM | POA: Diagnosis not present

## 2016-11-25 DIAGNOSIS — M9905 Segmental and somatic dysfunction of pelvic region: Secondary | ICD-10-CM | POA: Diagnosis not present

## 2016-12-10 ENCOUNTER — Other Ambulatory Visit: Payer: Self-pay | Admitting: Family Medicine

## 2016-12-10 MED ORDER — MONTELUKAST SODIUM 10 MG PO TABS: 10.0000 mg | ORAL_TABLET | Freq: Every day | ORAL | 0 refills | Status: DC

## 2016-12-10 NOTE — Telephone Encounter (Signed)
Pt would like a refill on Montelukast to be sent to [facility] [address].

## 2016-12-23 DIAGNOSIS — M5431 Sciatica, right side: Secondary | ICD-10-CM | POA: Diagnosis not present

## 2016-12-23 DIAGNOSIS — M9905 Segmental and somatic dysfunction of pelvic region: Secondary | ICD-10-CM | POA: Diagnosis not present

## 2016-12-23 DIAGNOSIS — M62830 Muscle spasm of back: Secondary | ICD-10-CM | POA: Diagnosis not present

## 2016-12-23 DIAGNOSIS — M9903 Segmental and somatic dysfunction of lumbar region: Secondary | ICD-10-CM | POA: Diagnosis not present

## 2016-12-30 DIAGNOSIS — M62830 Muscle spasm of back: Secondary | ICD-10-CM | POA: Diagnosis not present

## 2016-12-30 DIAGNOSIS — M9903 Segmental and somatic dysfunction of lumbar region: Secondary | ICD-10-CM | POA: Diagnosis not present

## 2016-12-30 DIAGNOSIS — M5431 Sciatica, right side: Secondary | ICD-10-CM | POA: Diagnosis not present

## 2016-12-30 DIAGNOSIS — M9905 Segmental and somatic dysfunction of pelvic region: Secondary | ICD-10-CM | POA: Diagnosis not present

## 2017-01-02 DIAGNOSIS — M62830 Muscle spasm of back: Secondary | ICD-10-CM | POA: Diagnosis not present

## 2017-01-02 DIAGNOSIS — M5431 Sciatica, right side: Secondary | ICD-10-CM | POA: Diagnosis not present

## 2017-01-02 DIAGNOSIS — M9905 Segmental and somatic dysfunction of pelvic region: Secondary | ICD-10-CM | POA: Diagnosis not present

## 2017-01-02 DIAGNOSIS — M9903 Segmental and somatic dysfunction of lumbar region: Secondary | ICD-10-CM | POA: Diagnosis not present

## 2017-01-15 ENCOUNTER — Ambulatory Visit
Admission: RE | Admit: 2017-01-15 | Discharge: 2017-01-15 | Disposition: A | Discharge status: Home / Self Care | Payer: Medicare HMO | Source: Ambulatory Visit | Attending: Family Medicine | Admitting: Family Medicine

## 2017-01-15 DIAGNOSIS — M85852 Other specified disorders of bone density and structure, left thigh: Secondary | ICD-10-CM | POA: Diagnosis present

## 2017-01-15 DIAGNOSIS — M8588 Other specified disorders of bone density and structure, other site: Secondary | ICD-10-CM | POA: Diagnosis not present

## 2017-01-15 DIAGNOSIS — M85851 Other specified disorders of bone density and structure, right thigh: Secondary | ICD-10-CM | POA: Diagnosis not present

## 2017-01-15 DIAGNOSIS — Z1231 Encounter for screening mammogram for malignant neoplasm of breast: Secondary | ICD-10-CM | POA: Insufficient documentation

## 2017-01-15 LAB — HM DEXA SCAN

## 2017-01-16 ENCOUNTER — Encounter: Payer: Self-pay | Admitting: Family Medicine

## 2017-01-17 ENCOUNTER — Other Ambulatory Visit: Payer: Self-pay | Admitting: *Deleted

## 2017-01-17 ENCOUNTER — Inpatient Hospital Stay
Admission: RE | Admit: 2017-01-17 | Discharge: 2017-01-17 | Disposition: A | Discharge status: Home / Self Care | Payer: Self-pay | Source: Ambulatory Visit | Attending: *Deleted | Admitting: *Deleted

## 2017-01-17 DIAGNOSIS — Z9289 Personal history of other medical treatment: Secondary | ICD-10-CM

## 2017-01-20 DIAGNOSIS — M62830 Muscle spasm of back: Secondary | ICD-10-CM | POA: Diagnosis not present

## 2017-01-20 DIAGNOSIS — M9903 Segmental and somatic dysfunction of lumbar region: Secondary | ICD-10-CM | POA: Diagnosis not present

## 2017-01-20 DIAGNOSIS — M5431 Sciatica, right side: Secondary | ICD-10-CM | POA: Diagnosis not present

## 2017-01-20 DIAGNOSIS — M9905 Segmental and somatic dysfunction of pelvic region: Secondary | ICD-10-CM | POA: Diagnosis not present

## 2017-02-24 DIAGNOSIS — M9905 Segmental and somatic dysfunction of pelvic region: Secondary | ICD-10-CM | POA: Diagnosis not present

## 2017-02-24 DIAGNOSIS — M9903 Segmental and somatic dysfunction of lumbar region: Secondary | ICD-10-CM | POA: Diagnosis not present

## 2017-02-24 DIAGNOSIS — M5431 Sciatica, right side: Secondary | ICD-10-CM | POA: Diagnosis not present

## 2017-02-24 DIAGNOSIS — M62830 Muscle spasm of back: Secondary | ICD-10-CM | POA: Diagnosis not present

## 2017-03-05 ENCOUNTER — Encounter: Payer: Self-pay | Admitting: Family Medicine

## 2017-03-05 ENCOUNTER — Ambulatory Visit (INDEPENDENT_AMBULATORY_CARE_PROVIDER_SITE_OTHER): Payer: Medicare HMO | Admitting: Family Medicine

## 2017-03-05 VITALS — BP 122/68 | HR 86 | Temp 98.0°F | Resp 16 | Ht 60.0 in | Wt 147.2 lb

## 2017-03-05 DIAGNOSIS — M150 Primary generalized (osteo)arthritis: Secondary | ICD-10-CM | POA: Diagnosis not present

## 2017-03-05 DIAGNOSIS — I10 Essential (primary) hypertension: Secondary | ICD-10-CM

## 2017-03-05 DIAGNOSIS — M8949 Other hypertrophic osteoarthropathy, multiple sites: Secondary | ICD-10-CM

## 2017-03-05 DIAGNOSIS — M5136 Other intervertebral disc degeneration, lumbar region: Secondary | ICD-10-CM

## 2017-03-05 DIAGNOSIS — J45909 Unspecified asthma, uncomplicated: Secondary | ICD-10-CM

## 2017-03-05 DIAGNOSIS — E559 Vitamin D deficiency, unspecified: Secondary | ICD-10-CM | POA: Diagnosis not present

## 2017-03-05 DIAGNOSIS — K219 Gastro-esophageal reflux disease without esophagitis: Secondary | ICD-10-CM

## 2017-03-05 DIAGNOSIS — M159 Polyosteoarthritis, unspecified: Secondary | ICD-10-CM

## 2017-03-05 DIAGNOSIS — E786 Lipoprotein deficiency: Secondary | ICD-10-CM

## 2017-03-05 MED ORDER — MELOXICAM 7.5 MG PO TABS: 7.5000 mg | ORAL_TABLET | Freq: Every day | ORAL | 1 refills | Status: DC

## 2017-03-05 MED ORDER — LEVOCETIRIZINE DIHYDROCHLORIDE 5 MG PO TABS: 5.0000 mg | ORAL_TABLET | Freq: Every evening | ORAL | 0 refills | Status: DC

## 2017-03-05 MED ORDER — MONTELUKAST SODIUM 10 MG PO TABS: 10.0000 mg | ORAL_TABLET | Freq: Every day | ORAL | 0 refills | Status: DC

## 2017-03-05 MED ORDER — VALSARTAN-HYDROCHLOROTHIAZIDE 160-12.5 MG PO TABS: 1.0000 | ORAL_TABLET | Freq: Every day | ORAL | 1 refills | Status: DC

## 2017-03-05 MED ORDER — AMLODIPINE BESYLATE 2.5 MG PO TABS: 2.5000 mg | ORAL_TABLET | Freq: Every day | ORAL | 1 refills | Status: DC

## 2017-03-05 NOTE — Progress Notes (Signed)
Name: Ruth [name]   MRN: [id number]    DOB: July 02, 1946   Date:03/05/2017       Progress Note  Subjective  Chief Complaint  Chief Complaint  Patient presents with  . Medication Refill    4 month F/U  . Hypertension    Denies any symptoms  . Gastroesophageal Reflux    Flairs up recently, if she misses her pill she can tell.    HPI   HTN: patient is compliant with medication, bp is at goal, no chest pain or palpitation. She was mowing her yard ( riding mower ) and did not drink enough fluids and got dizzy, but otherwise she is doing well.  Chronic allergic bronchitis: she is doing better, cough resolved, she was seen by Immunologist , she is on Xyzal and singulair, she has an occasional wheeze  Vitamin D deficiency: we will recheck levels, history of hyperparathyroidism, and at one point she was osteoporotic, but last one just showed osteopenia  GERD: she has tried weaning self off Omeprazole but it causes reflux symptoms. She is taking Meloxicam and advised to try weaning off that before trying to stop PPI, because of long term risk of PPI. She is down to Meloxicam once daily.  OA: she is taking Meloxicam, but explained importance of trying to change to Tylenol. Worse symptoms is aching pain on left hip, sometimes her hands, also both knees. No effusion or redness. Also back DDD, seeing chiropractor  Low HDL: discussed ways to improve HDL  Patient Active Problem List   Diagnosis Date Noted  . Vitamin D deficiency 10/31/2016  . Osteopenia of left hip 10/31/2016  . 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  . BPV (benign positional vertigo) 04/17/2016  . RLS (restless legs syndrome) 04/17/2016  . GERD (gastroesophageal reflux disease) 03/11/2016  . History of hyperparathyroidism 03/11/2016  . History of gastric ulcer 03/11/2016  . Essential hypertension 04/24/2015  . Primary osteoarthritis involving multiple joints 04/24/2015  . Lumbago 04/24/2015  . Family history of  colonic polyps     Past Surgical History:  Procedure Laterality Date  . CARPAL TUNNEL RELEASE Bilateral 2000  . COLONOSCOPY  06/17/2012   A few two mm ulcers were found in the sigmoid colon, no bleeding present,bx taken-path reporet on the few tiny ulcers ,non specific  . parathyroid surgery  2008  . 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 TUBAL LIGATION      Family History  Problem Relation Age of Onset  . Kidney disease Mother   . Ovarian cancer Mother   . Hypertension Father   . Stroke Father   . Lupus Sister     Social History   Social History  . Marital status: Married    Spouse name: N/A  . Number of children: N/A  . Years of education: N/A   Occupational History  . Not on file.   Social History Main Topics  . Smoking status: Never Smoker  . Smokeless tobacco: Never Used  . Alcohol use 0.0 oz/week     Comment: wine; 3-4 times per year  . Drug use: No  . Sexual activity: Yes    Partners: Male   Other Topics Concern  . Not on file   Social History Narrative  . No narrative on file     Current Outpatient Prescriptions:  .  acetaminophen (TYLENOL) 500 MG tablet, Take 1-2 tablets (500-1,000 mg total) by mouth every 8 (eight) hours as needed., Disp: 100 tablet, Rfl: 2 .  amLODipine (NORVASC) 2.5 MG tablet, Take 1 tablet (2.5 mg total) by mouth daily., Disp: 90 tablet, Rfl: 1 .  Ascorbic Acid (VITAMIN C) 1000 MG tablet, Take by mouth., Disp: , Rfl:  .  Calcium-Vitamin D 600-200 MG-UNIT tablet, Take by mouth., Disp: , Rfl:  .  Cholecalciferol (VITAMIN D3) 1000 units CAPS, Take by mouth., Disp: , Rfl:  .  fluticasone (FLONASE) 50 MCG/ACT nasal spray, Place into the nose., Disp: , Rfl:  .  L-Lysine 500 MG TABS, Take by mouth., Disp: , Rfl:  .  magnesium oxide (MAG-OX) 400 MG tablet, Take by mouth., Disp: , Rfl:  .  meloxicam (MOBIC) 7.5 MG tablet, Take 1 tablet (7.5 mg total) by mouth daily., Disp: 90 tablet, Rfl: 1 .   montelukast (SINGULAIR) 10 MG tablet, Take 1 tablet (10 mg total) by mouth at bedtime., Disp: 90 tablet, Rfl: 0 .  Nutritional Supplements (JOINT FORMULA PO), Take by mouth., Disp: , Rfl:  .  omeprazole (PRILOSEC) 40 MG capsule, Take 1 capsule (40 mg total) by mouth daily., Disp: 90 capsule, Rfl: 3 .  Potassium Gluconate (CVS POTASSIUM GLUCONATE) 2 MEQ TABS, Take by mouth., Disp: , Rfl:  .  valsartan-hydrochlorothiazide (DIOVAN-HCT) 160-12.5 MG tablet, Take 1 tablet by mouth daily. In place of HCTZ, Disp: 90 tablet, Rfl: 1  Allergies  Allergen Reactions  . Ace Inhibitors Cough  . Aspirin Nausea Only     ROS  Constitutional: Negative for fever or weight change.  Respiratory: Negative for cough and shortness of breath.   Cardiovascular: Negative for chest pain or palpitations.  Gastrointestinal: Negative for abdominal pain, no bowel changes.  Musculoskeletal: Positive  for gait problem or joint swelling.  Skin: Negative for rash.  Neurological: Negative for dizziness or headache.  No other specific complaints in a complete review of systems (except as listed in HPI above).  Objective  Vitals:   03/05/17 0844  BP: 122/68  Pulse: 86  Resp: 16  Temp: 98 F (36.7 C)  TempSrc: Oral  SpO2: 98%  Weight: 147 lb 3.2 oz (66.8 kg)  Height: 5' (1.524 m)    Body mass index is 28.75 kg/m.  Physical Exam  Constitutional: Patient appears well-developed and well-nourished. Obese  No distress.  HEENT: head atraumatic, normocephalic, pupils equal and reactive to light, neck supple, throat within normal limits Cardiovascular: Normal rate, regular rhythm and normal heart sounds.  No murmur heard. No BLE edema. Pulmonary/Chest: Effort normal and breath sounds normal. No respiratory distress. Muscular Skeletal: crepitus with extension of both knees, also has hypertrophic PIP on both hands Abdominal: Soft.  There is no tenderness. Psychiatric: Patient has a normal mood and affect. behavior is  normal. Judgment and thought content normal.  Recent Results (from the past 2160 hour(s))  HM DEXA SCAN     Status: None   Collection Time: 01/15/17 12:00 AM  Result Value Ref Range   HM Dexa Scan Osteopenia     Comment: [facility], Low FRAX     PHQ2/9: Depression screen [facility] 2/9 03/05/2017 10/31/2016 06/18/2016 03/11/2016 10/05/2015  Decreased Interest 0 0 0 0 0  Down, Depressed, Hopeless 0 0 0 0 0  PHQ - 2 Score 0 0 0 0 0     Fall Risk: Fall Risk  03/05/2017 10/31/2016 06/18/2016 03/11/2016 10/05/2015  Falls in the past year? No No No Yes No  Number falls in past yr: - - - 1 -  Injury with Fall? - - - No -  Functional Status Survey: Is the patient deaf or have difficulty hearing?: No Does the patient have difficulty seeing, even when wearing glasses/contacts?: No Does the patient have difficulty concentrating, remembering, or making decisions?: No Does the patient have difficulty walking or climbing stairs?: No Does the patient have difficulty dressing or bathing?: No Does the patient have difficulty doing errands alone such as visiting a doctor's office or shopping?: No    Assessment & Plan  1. Essential hypertension  She is afraid of going down on medication, she was advised to stay hydrated - valsartan-hydrochlorothiazide (DIOVAN-HCT) 160-12.5 MG tablet; Take 1 tablet by mouth daily. In place of HCTZ  Dispense: 90 tablet; Refill: 1 - amLODipine (NORVASC) 2.5 MG tablet; Take 1 tablet (2.5 mg total) by mouth daily.  Dispense: 90 tablet; Refill: 1  2. Gastroesophageal reflux disease without esophagitis  Continue medication, she has been unable to wean self off PPI  3. DDD (degenerative disc disease), lumbar  - meloxicam (MOBIC) 7.5 MG tablet; Take 1 tablet (7.5 mg total) by mouth daily.  Dispense: 90 tablet; Refill: 1  4. Primary osteoarthritis involving multiple joints  Continue prn Tylenol  - meloxicam (MOBIC) 7.5 MG tablet; Take 1 tablet (7.5 mg total)  by mouth daily.  Dispense: 90 tablet; Refill: 1  5. Vitamin D deficiency  Continue supplementation   6. 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

## 2017-03-24 ENCOUNTER — Other Ambulatory Visit: Payer: Self-pay | Admitting: Family Medicine

## 2017-03-24 DIAGNOSIS — M9903 Segmental and somatic dysfunction of lumbar region: Secondary | ICD-10-CM | POA: Diagnosis not present

## 2017-03-24 DIAGNOSIS — M62830 Muscle spasm of back: Secondary | ICD-10-CM | POA: Diagnosis not present

## 2017-03-24 DIAGNOSIS — M5431 Sciatica, right side: Secondary | ICD-10-CM | POA: Diagnosis not present

## 2017-03-24 DIAGNOSIS — M9905 Segmental and somatic dysfunction of pelvic region: Secondary | ICD-10-CM | POA: Diagnosis not present

## 2017-03-24 NOTE — Telephone Encounter (Signed)
Patient requesting refill of Singular to [facility].

## 2017-03-27 ENCOUNTER — Other Ambulatory Visit: Payer: Self-pay | Admitting: Family Medicine

## 2017-03-27 DIAGNOSIS — J45909 Unspecified asthma, uncomplicated: Secondary | ICD-10-CM

## 2017-03-27 MED ORDER — MONTELUKAST SODIUM 10 MG PO TABS: 10.0000 mg | ORAL_TABLET | Freq: Every day | ORAL | 0 refills | Status: DC

## 2017-03-27 NOTE — Telephone Encounter (Signed)
Patient requesting refill of Singulair to [facility].  

## 2017-03-27 NOTE — Telephone Encounter (Signed)
Pt said that the pharm has sent a request for refill on Monday but did not get a reply. They gave her a emergency supply and she needs a refill and only has 2 refills. Pharm is [facility] [address]

## 2017-03-27 NOTE — Telephone Encounter (Signed)
For which medication?

## 2017-03-27 NOTE — Telephone Encounter (Signed)
There is a pharm request in . What every that one is . She said the generic for Singualr

## 2017-04-08 ENCOUNTER — Other Ambulatory Visit: Payer: Self-pay

## 2017-04-08 DIAGNOSIS — I10 Essential (primary) hypertension: Secondary | ICD-10-CM

## 2017-04-08 MED ORDER — OLMESARTAN MEDOXOMIL-HCTZ 20-12.5 MG PO TABS: 1.0000 | ORAL_TABLET | Freq: Every day | ORAL | 0 refills | Status: DC

## 2017-04-08 NOTE — Telephone Encounter (Signed)
Valsartan/HCTZ has been recalled and [facility] wanted to ask to send Valsartan 160 mg and HCTZ separately.

## 2017-04-21 DIAGNOSIS — M5431 Sciatica, right side: Secondary | ICD-10-CM | POA: Diagnosis not present

## 2017-04-21 DIAGNOSIS — M9903 Segmental and somatic dysfunction of lumbar region: Secondary | ICD-10-CM | POA: Diagnosis not present

## 2017-04-21 DIAGNOSIS — M62830 Muscle spasm of back: Secondary | ICD-10-CM | POA: Diagnosis not present

## 2017-04-21 DIAGNOSIS — M9905 Segmental and somatic dysfunction of pelvic region: Secondary | ICD-10-CM | POA: Diagnosis not present

## 2017-05-22 DIAGNOSIS — R05 Cough: Secondary | ICD-10-CM | POA: Diagnosis not present

## 2017-05-22 DIAGNOSIS — J300 Vasomotor rhinitis: Secondary | ICD-10-CM | POA: Diagnosis not present

## 2017-05-27 ENCOUNTER — Telehealth: Payer: Self-pay

## 2017-05-27 NOTE — Telephone Encounter (Signed)
Message left for patient. She had called the answering service about her appointment here on 05/29/17. This is for a consultation about having a Colonoscopy and not a procedure visit.

## 2017-05-29 ENCOUNTER — Ambulatory Visit: Payer: Self-pay | Admitting: General Surgery

## 2017-06-02 ENCOUNTER — Encounter: Payer: Self-pay | Admitting: General Surgery

## 2017-06-02 ENCOUNTER — Ambulatory Visit (INDEPENDENT_AMBULATORY_CARE_PROVIDER_SITE_OTHER): Payer: Medicare HMO | Admitting: General Surgery

## 2017-06-02 VITALS — BP 134/74 | HR 82 | Resp 12 | Ht 60.0 in | Wt 148.0 lb

## 2017-06-02 DIAGNOSIS — Z8371 Family history of colonic polyps: Secondary | ICD-10-CM

## 2017-06-02 MED ORDER — POLYETHYLENE GLYCOL 3350 17 GM/SCOOP PO POWD: 1.0000 | Freq: Once | ORAL | 0 refills | Status: AC

## 2017-06-02 NOTE — Progress Notes (Signed)
Patient ID: Ruth [name], female   DOB: 06-19-1946, 71 y.o.   MRN: [id number]  Chief Complaint  Patient presents with  . Follow-up    5-year Colonoscopy    HPI GENIENE LIST is a 71 y.o. female is here for a 5 year colonoscopy follow up.The last colonoscopy was done in 2013. Patient states she is moving her bowels daily. She has been having some constipation. This has been going on for about 3-4 days. This happens more when she is out of town or on trips. Denies blood, or mucous in her stool.  HPI  Past Medical History:  Diagnosis Date  . Allergy    mycins; cause diarrhea  . Arthritis    since 2007  . Breast screening, unspecified   . Family history of colonic polyps   . History of cystitis 1987  . Hypertension    since 2005  . Parathyroid disorder [facility]) 2008   surgery  . Ulcer 1966    Past Surgical History:  Procedure Laterality Date  . CARPAL TUNNEL RELEASE Bilateral 2000  . COLONOSCOPY  06/17/2012   A few two mm ulcers were found in the sigmoid colon, no bleeding present,bx taken-path reporet on the few tiny ulcers ,non specific  . parathyroid surgery  2008  . 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 TUBAL LIGATION      Family History  Problem Relation Age of Onset  . Kidney disease Mother   . Ovarian cancer Mother   . Hypertension Father   . Stroke Father   . Lupus Sister     Social History Social History  Substance Use Topics  . Smoking status: Never Smoker  . Smokeless tobacco: Never Used  . Alcohol use 0.0 oz/week     Comment: wine; 3-4 times per year    Allergies  Allergen Reactions  . Ace Inhibitors Cough  . Aspirin Nausea Only    Current Outpatient Prescriptions  Medication Sig Dispense Refill  . acetaminophen (TYLENOL) 500 MG tablet Take 1-2 tablets (500-1,000 mg total) by mouth every 8 (eight) hours as needed. 100 tablet 2  . amLODipine (NORVASC) 2.5 MG tablet Take 1 tablet (2.5 mg total) by mouth daily. 90  tablet 1  . Ascorbic Acid (VITAMIN C) 1000 MG tablet Take by mouth.    . Biotin 1000 MCG CHEW Chew by mouth.    . Calcium-Vitamin D 600-200 MG-UNIT tablet Take by mouth.    . Cholecalciferol (VITAMIN D3) 1000 units CAPS Take by mouth.    . fluticasone (FLONASE) 50 MCG/ACT nasal spray Place into the nose.    Marland Kitchen L-Lysine 500 MG TABS Take by mouth.    . levocetirizine (XYZAL) 5 MG tablet Take 1 tablet (5 mg total) by mouth every evening. 30 tablet 0  . magnesium oxide (MAG-OX) 400 MG tablet Take by mouth.    . meloxicam (MOBIC) 7.5 MG tablet Take 1 tablet (7.5 mg total) by mouth daily. 90 tablet 1  . montelukast (SINGULAIR) 10 MG tablet Take 1 tablet (10 mg total) by mouth at bedtime. 90 tablet 0  . Nutritional Supplements (JOINT FORMULA PO) Take by mouth.    . olmesartan-hydrochlorothiazide (BENICAR HCT) 20-12.5 MG tablet Take 1 tablet by mouth daily. 90 tablet 0  . omeprazole (PRILOSEC) 40 MG capsule Take 1 capsule (40 mg total) by mouth daily. 90 capsule 3  . Potassium Gluconate (CVS POTASSIUM GLUCONATE) 2 MEQ TABS Take by mouth.     No current facility-administered  medications for this visit.     Review of Systems Review of Systems  Constitutional: Negative.   Respiratory: Negative.   Cardiovascular: Negative.   Gastrointestinal: Positive for constipation.    Blood pressure 134/74, pulse 82, resp. rate 12, height 5' (1.524 m), weight 148 lb (67.1 kg).  Physical Exam Physical Exam  Constitutional: She is oriented to person, place, and time. She appears well-developed and well-nourished.  Eyes: Conjunctivae are normal. No scleral icterus.  Neck: Neck supple. No thyromegaly present.  Cardiovascular: Normal rate, regular rhythm and normal heart sounds.   Pulmonary/Chest: Effort normal and breath sounds normal.  Abdominal: Soft. Bowel sounds are normal. She exhibits no mass. There is no hepatomegaly. There is no tenderness. No hernia.  Lymphadenopathy:    She has no cervical  adenopathy.  Neurological: She is alert and oriented to person, place, and time.  Skin: Skin is warm and dry.    Data Reviewed Previous colonoscopy and pathology report  Assessment    Surveillance colonoscopy due. FH of colon polyps    Plan     Colonoscopy with possible biopsy/polypectomy prn: Information regarding the procedure, including its potential risks and complications (including but not limited to perforation of the bowel, which may require emergency surgery to repair, and bleeding) was verbally given to the patient. Educational information regarding lower intestinal endoscopy was given to the patient. Written instructions for how to complete the bowel prep using Miralax were provided. The importance of drinking ample fluids to avoid dehydration as a result of the prep emphasized.      HPI, Physical Exam, Assessment and Plan have been scribed under the direction and in the presence of Kathreen Cosier, MD.  Milas Kocher, CMA I have completed the exam and reviewed the above documentation for accuracy and completeness.  I agree with the above.  Museum/gallery conservator has been used and any errors in dictation or transcription are unintentional.  Seeplaputhur G. Evette Cristal, M.D., F.A.C.S.        Gerlene Burdock G 06/03/2017, 8:59 AM  Patient has been scheduled for a colonoscopy on 06-11-17 at [facility]. Miralax prescription has been sent in to the patient's pharmacy today. She will make use of Dulcolax 2- 5 mg tablets the morning and afternoon day prior to colonoscopy prep. Colonoscopy instructions have been reviewed with the patient. This patient is aware to call the office if they have further questions.   Nicholes Mango, CMA

## 2017-06-02 NOTE — Patient Instructions (Signed)
Colonoscopy, Adult A colonoscopy is an exam to look at the entire large intestine. During the exam, a lubricated, bendable tube is inserted into the anus and then passed into the rectum, colon, and other parts of the large intestine. A colonoscopy is often done as a part of normal colorectal screening or in response to certain symptoms, such as anemia, persistent diarrhea, abdominal pain, and blood in the stool. The exam can help screen for and diagnose medical problems, including:  Tumors.  Polyps.  Inflammation.  Areas of bleeding.  Tell a health care provider about:  Any allergies you have.  All medicines you are taking, including vitamins, herbs, eye drops, creams, and over-the-counter medicines.  Any problems you or family members have had with anesthetic medicines.  Any blood disorders you have.  Any surgeries you have had.  Any medical conditions you have.  Any problems you have had passing stool. What are the risks? Generally, this is a safe procedure. However, problems may occur, including:  Bleeding.  A tear in the intestine.  A reaction to medicines given during the exam.  Infection (rare).  What happens before the procedure? Eating and drinking restrictions Follow instructions from your health care provider about eating and drinking, which may include:  A few days before the procedure - follow a low-fiber diet. Avoid nuts, seeds, dried fruit, raw fruits, and vegetables.  1-3 days before the procedure - follow a clear liquid diet. Drink only clear liquids, such as clear broth or bouillon, black coffee or tea, clear juice, clear soft drinks or sports drinks, gelatin dessert, and popsicles. Avoid any liquids that contain red or purple dye.  On the day of the procedure - do not eat or drink anything during the 2 hours before the procedure, or within the time period that your health care provider recommends.  Bowel prep If you were prescribed an oral bowel prep  to clean out your colon:  Take it as told by your health care provider. Starting the day before your procedure, you will need to drink a large amount of medicated liquid. The liquid will cause you to have multiple loose stools until your stool is almost clear or light green.  If your skin or anus gets irritated from diarrhea, you may use these to relieve the irritation: ? Medicated wipes, such as adult wet wipes with aloe and vitamin E. ? A skin soothing-product like petroleum jelly.  If you vomit while drinking the bowel prep, take a break for up to 60 minutes and then begin the bowel prep again. If vomiting continues and you cannot take the bowel prep without vomiting, call your health care provider.  General instructions  Ask your health care provider about changing or stopping your regular medicines. This is especially important if you are taking diabetes medicines or blood thinners.  Plan to have someone take you home from the hospital or clinic. What happens during the procedure?  An IV tube may be inserted into one of your veins.  You will be given medicine to help you relax (sedative).  To reduce your risk of infection: ? Your health care team will wash or sanitize their hands. ? Your anal area will be washed with soap.  You will be asked to lie on your side with your knees bent.  Your health care provider will lubricate a long, thin, flexible tube. The tube will have a camera and a light on the end.  The tube will be inserted into your   anus.  The tube will be gently eased through your rectum and colon.  Air will be delivered into your colon to keep it open. You may feel some pressure or cramping.  The camera will be used to take images during the procedure.  A small tissue sample may be removed from your body to be examined under a microscope (biopsy). If any potential problems are found, the tissue will be sent to a lab for testing.  If small polyps are found, your  health care provider may remove them and have them checked for cancer cells.  The tube that was inserted into your anus will be slowly removed. The procedure may vary among health care providers and hospitals. What happens after the procedure?  Your blood pressure, heart rate, breathing rate, and blood oxygen level will be monitored until the medicines you were given have worn off.  Do not drive for 24 hours after the exam.  You may have a small amount of blood in your stool.  You may pass gas and have mild abdominal cramping or bloating due to the air that was used to inflate your colon during the exam.  It is up to you to get the results of your procedure. Ask your health care provider, or the department performing the procedure, when your results will be ready. This information is not intended to replace advice given to you by your health care provider. Make sure you discuss any questions you have with your health care provider. Document Released: 09/06/2000 Document Revised: 07/10/2016 Document Reviewed: 11/21/2015 Elsevier Interactive Patient Education  2018 Elsevier Inc.  

## 2017-06-11 ENCOUNTER — Ambulatory Visit
Admission: RE | Admit: 2017-06-11 | Discharge: 2017-06-11 | Disposition: A | Discharge status: Home / Self Care | Payer: Medicare HMO | Source: Ambulatory Visit | Attending: General Surgery | Admitting: General Surgery

## 2017-06-11 ENCOUNTER — Ambulatory Visit: Payer: Medicare HMO | Admitting: Anesthesiology

## 2017-06-11 ENCOUNTER — Encounter
Admission: RE | Disposition: A | Discharge status: Home / Self Care | Payer: Self-pay | Source: Ambulatory Visit | Attending: General Surgery

## 2017-06-11 DIAGNOSIS — Z1211 Encounter for screening for malignant neoplasm of colon: Secondary | ICD-10-CM | POA: Diagnosis not present

## 2017-06-11 DIAGNOSIS — Z83719 Family history of colon polyps, unspecified: Secondary | ICD-10-CM

## 2017-06-11 DIAGNOSIS — Z886 Allergy status to analgesic agent status: Secondary | ICD-10-CM | POA: Insufficient documentation

## 2017-06-11 DIAGNOSIS — K5900 Constipation, unspecified: Secondary | ICD-10-CM | POA: Diagnosis not present

## 2017-06-11 DIAGNOSIS — Z888 Allergy status to other drugs, medicaments and biological substances status: Secondary | ICD-10-CM | POA: Diagnosis not present

## 2017-06-11 DIAGNOSIS — Z8371 Family history of colonic polyps: Secondary | ICD-10-CM | POA: Diagnosis not present

## 2017-06-11 DIAGNOSIS — I10 Essential (primary) hypertension: Secondary | ICD-10-CM | POA: Insufficient documentation

## 2017-06-11 DIAGNOSIS — K219 Gastro-esophageal reflux disease without esophagitis: Secondary | ICD-10-CM | POA: Insufficient documentation

## 2017-06-11 DIAGNOSIS — Z79899 Other long term (current) drug therapy: Secondary | ICD-10-CM | POA: Diagnosis not present

## 2017-06-11 DIAGNOSIS — M1990 Unspecified osteoarthritis, unspecified site: Secondary | ICD-10-CM | POA: Insufficient documentation

## 2017-06-11 HISTORY — PX: COLONOSCOPY WITH PROPOFOL: SHX5780

## 2017-06-11 SURGERY — COLONOSCOPY WITH PROPOFOL
Anesthesia: General

## 2017-06-11 MED ORDER — PROPOFOL 10 MG/ML IV BOLUS
INTRAVENOUS | Status: DC | PRN
  Administered 2017-06-11: 60 mg via INTRAVENOUS

## 2017-06-11 MED ORDER — LIDOCAINE HCL (PF) 2 % IJ SOLN
INTRAMUSCULAR | Status: AC
  Filled 2017-06-11: qty 2

## 2017-06-11 MED ORDER — SODIUM CHLORIDE 0.9 % IV SOLN
INTRAVENOUS | Status: DC
Start: 2017-06-11 — End: 2017-06-11
  Administered 2017-06-11: 1000 mL via INTRAVENOUS

## 2017-06-11 MED ORDER — PROPOFOL 500 MG/50ML IV EMUL
INTRAVENOUS | Status: DC | PRN
  Administered 2017-06-11: 150 ug/kg/min via INTRAVENOUS

## 2017-06-11 MED ORDER — PROPOFOL 500 MG/50ML IV EMUL
INTRAVENOUS | Status: AC
  Filled 2017-06-11: qty 50

## 2017-06-11 MED ORDER — LIDOCAINE HCL (CARDIAC) 20 MG/ML IV SOLN
INTRAVENOUS | Status: DC | PRN
  Administered 2017-06-11: 40 mg via INTRAVENOUS

## 2017-06-11 NOTE — Anesthesia Procedure Notes (Signed)
Performed by: Jerrold Haskell Pre-anesthesia Checklist: Patient identified, Emergency Drugs available, Suction available, Patient being monitored and Timeout performed Patient Re-evaluated:Patient Re-evaluated prior to induction Oxygen Delivery Method: Nasal cannula Induction Type: IV induction       

## 2017-06-11 NOTE — H&P (View-Only) (Signed)
Patient ID: Ruth [name], female   DOB: 12/07/1945, 71 y.o.   MRN: [id number]  Chief Complaint  Patient presents with  . Follow-up    5-year Colonoscopy    HPI Declan J Hice is a 71 y.o. female is here for a 5 year colonoscopy follow up.The last colonoscopy was done in 2013. Patient states she is moving her bowels daily. She has been having some constipation. This has been going on for about 3-4 days. This happens more when she is out of town or on trips. Denies blood, or mucous in her stool.  HPI  Past Medical History:  Diagnosis Date  . Allergy    mycins; cause diarrhea  . Arthritis    since 2007  . Breast screening, unspecified   . Family history of colonic polyps   . History of cystitis 1987  . Hypertension    since 2005  . Parathyroid disorder (HCC) 2008   surgery  . Ulcer 1966    Past Surgical History:  Procedure Laterality Date  . CARPAL TUNNEL RELEASE Bilateral 2000  . COLONOSCOPY  06/17/2012   A few two mm ulcers were found in the sigmoid colon, no bleeding present,bx taken-path reporet on the few tiny ulcers ,non specific  . parathyroid surgery  2008  . 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 TUBAL LIGATION      Family History  Problem Relation Age of Onset  . Kidney disease Mother   . Ovarian cancer Mother   . Hypertension Father   . Stroke Father   . Lupus Sister     Social History Social History  Substance Use Topics  . Smoking status: Never Smoker  . Smokeless tobacco: Never Used  . Alcohol use 0.0 oz/week     Comment: wine; 3-4 times per year    Allergies  Allergen Reactions  . Ace Inhibitors Cough  . Aspirin Nausea Only    Current Outpatient Prescriptions  Medication Sig Dispense Refill  . acetaminophen (TYLENOL) 500 MG tablet Take 1-2 tablets (500-1,000 mg total) by mouth every 8 (eight) hours as needed. 100 tablet 2  . amLODipine (NORVASC) 2.5 MG tablet Take 1 tablet (2.5 mg total) by mouth daily. 90  tablet 1  . Ascorbic Acid (VITAMIN C) 1000 MG tablet Take by mouth.    . Biotin 1000 MCG CHEW Chew by mouth.    . Calcium-Vitamin D 600-200 MG-UNIT tablet Take by mouth.    . Cholecalciferol (VITAMIN D3) 1000 units CAPS Take by mouth.    . fluticasone (FLONASE) 50 MCG/ACT nasal spray Place into the nose.    . L-Lysine 500 MG TABS Take by mouth.    . levocetirizine (XYZAL) 5 MG tablet Take 1 tablet (5 mg total) by mouth every evening. 30 tablet 0  . magnesium oxide (MAG-OX) 400 MG tablet Take by mouth.    . meloxicam (MOBIC) 7.5 MG tablet Take 1 tablet (7.5 mg total) by mouth daily. 90 tablet 1  . montelukast (SINGULAIR) 10 MG tablet Take 1 tablet (10 mg total) by mouth at bedtime. 90 tablet 0  . Nutritional Supplements (JOINT FORMULA PO) Take by mouth.    . olmesartan-hydrochlorothiazide (BENICAR HCT) 20-12.5 MG tablet Take 1 tablet by mouth daily. 90 tablet 0  . omeprazole (PRILOSEC) 40 MG capsule Take 1 capsule (40 mg total) by mouth daily. 90 capsule 3  . Potassium Gluconate ([employer] POTASSIUM GLUCONATE) 2 MEQ TABS Take by mouth.     No current facility-administered   medications for this visit.     Review of Systems Review of Systems  Constitutional: Negative.   Respiratory: Negative.   Cardiovascular: Negative.   Gastrointestinal: Positive for constipation.    Blood pressure 134/74, pulse 82, resp. rate 12, height 5' (1.524 m), weight 148 lb (67.1 kg).  Physical Exam Physical Exam  Constitutional: She is oriented to person, place, and time. She appears well-developed and well-nourished.  Eyes: Conjunctivae are normal. No scleral icterus.  Neck: Neck supple. No thyromegaly present.  Cardiovascular: Normal rate, regular rhythm and normal heart sounds.   Pulmonary/Chest: Effort normal and breath sounds normal.  Abdominal: Soft. Bowel sounds are normal. She exhibits no mass. There is no hepatomegaly. There is no tenderness. No hernia.  Lymphadenopathy:    She has no cervical  adenopathy.  Neurological: She is alert and oriented to person, place, and time.  Skin: Skin is warm and dry.    Data Reviewed Previous colonoscopy and pathology report  Assessment    Surveillance colonoscopy due. FH of colon polyps    Plan     Colonoscopy with possible biopsy/polypectomy prn: Information regarding the procedure, including its potential risks and complications (including but not limited to perforation of the bowel, which may require emergency surgery to repair, and bleeding) was verbally given to the patient. Educational information regarding lower intestinal endoscopy was given to the patient. Written instructions for how to complete the bowel prep using Miralax were provided. The importance of drinking ample fluids to avoid dehydration as a result of the prep emphasized.      HPI, Physical Exam, Assessment and Plan have been scribed under the direction and in the presence of Kathreen Cosier, MD.  Milas Kocher, CMA I have completed the exam and reviewed the above documentation for accuracy and completeness.  I agree with the above.  Museum/gallery conservator has been used and any errors in dictation or transcription are unintentional.  Kaniya Trueheart G. Evette Cristal, M.D., F.A.C.S.        Gerlene Burdock G 06/03/2017, 8:59 AM  Patient has been scheduled for a colonoscopy on 06-11-17 at [facility]. Miralax prescription has been sent in to the patient's pharmacy today. She will make use of Dulcolax 2- 5 mg tablets the morning and afternoon day prior to colonoscopy prep. Colonoscopy instructions have been reviewed with the patient. This patient is aware to call the office if they have further questions.   Nicholes Mango, CMA

## 2017-06-11 NOTE — Transfer of Care (Signed)
Immediate Anesthesia Transfer of Care Note  Patient: Ruth [name]  Procedure(s) Performed: Procedure(s): COLONOSCOPY WITH PROPOFOL (N/A)  Patient Location: PACU  Anesthesia Type:General  Level of Consciousness: sedated  Airway & Oxygen Therapy: Patient Spontanous Breathing and Patient connected to nasal cannula oxygen  Post-op Assessment: Report given to RN and Post -op Vital signs reviewed and stable  Post vital signs: Reviewed and stable  Last Vitals:  Vitals:   06/11/17 0957 06/11/17 0958  BP: (!) 93/51 (!) 93/51  Pulse: 82 82  Resp: 17 16  Temp: (!) 35.6 C   SpO2: 96% 98%    Last Pain:  Vitals:   06/11/17 0957  TempSrc: Tympanic  PainSc:          Complications: No apparent anesthesia complications

## 2017-06-11 NOTE — Interval H&P Note (Signed)
History and Physical Interval Note:  06/11/2017 9:07 AM  Ruth [name]  has presented today for surgery, with the diagnosis of FAMILY HISTORY OF COLON POLYPS  The various methods of treatment have been discussed with the patient and family. After consideration of risks, benefits and other options for treatment, the patient has consented to  Procedure(s): COLONOSCOPY WITH PROPOFOL (N/A) as a surgical intervention .  The patient's history has been reviewed, patient examined, no change in status, stable for surgery.  I have reviewed the patient's chart and labs.  Questions were answered to the patient's satisfaction.     Rylei Masella G

## 2017-06-11 NOTE — Op Note (Signed)
[facility] Gastroenterology Patient Name: Ruth [name] Procedure Date: 06/11/2017 9:05 AM MRN: [id number] Account #: [id number] Date of Birth: 04-Mar-1946 Admit Type: Outpatient Age: 71 Room: [facility] ENDO ROOM 4 Gender: Female Note Status: Finalized Procedure:            Colonoscopy Indications:          Family history of colonic polyps in a first-degree                        relative Providers:            Seeplaputhur G. Evette Cristal, MD Referring MD:         Onnie Boer. Sowles, MD (Referring MD) Medicines:            General Anesthesia Complications:        No immediate complications. Procedure:            Pre-Anesthesia Assessment:                       - General anesthesia under the supervision of an                        anesthesiologist was determined to be medically                        necessary for this procedure based on review of the                        patient's medical history, medications, and prior                        anesthesia history.                       After obtaining informed consent, the colonoscope was                        passed under direct vision. Throughout the procedure,                        the patient's blood pressure, pulse, and oxygen                        saturations were monitored continuously. The                        Colonoscope was introduced through the anus and                        advanced to the the cecum, identified by the ileocecal                        valve. The colonoscopy was performed without                        difficulty. The patient tolerated the procedure well.                        The quality of the bowel preparation was good. Findings:      The perianal and digital rectal examinations were normal.      The entire examined  colon appeared normal. Impression:           - The entire examined colon is normal.                       - No specimens collected. Recommendation:       - Discharge  patient to home.                       - Resume previous diet.                       - Repeat colonoscopy in 5 years for surveillance. Procedure Code(s):    --- Professional ---                       [id number], Colonoscopy, flexible; diagnostic, including                        collection of specimen(s) by brushing or washing, when                        performed (separate procedure) Diagnosis Code(s):    --- Professional ---                       Z83.71, Family history of colonic polyps CPT copyright 2016 American Medical Association. All rights reserved. The codes documented in this report are preliminary and upon coder review may  be revised to meet current compliance requirements. Kieth Brightly, MD 06/11/2017 9:56:52 AM This report has been signed electronically. Number of Addenda: 0 Note Initiated On: 06/11/2017 9:05 AM Scope Withdrawal Time: 0 hours 9 minutes 9 seconds  Total Procedure Duration: 0 hours 31 minutes 15 seconds       [facility]

## 2017-06-11 NOTE — Anesthesia Postprocedure Evaluation (Signed)
Anesthesia Post Note  Patient: Ruth [name]  Procedure(s) Performed: Procedure(s) (LRB): COLONOSCOPY WITH PROPOFOL (N/A)  Patient location during evaluation: PACU Anesthesia Type: General Level of consciousness: awake and alert Pain management: pain level controlled Vital Signs Assessment: post-procedure vital signs reviewed and stable Respiratory status: spontaneous breathing, nonlabored ventilation, respiratory function stable and patient connected to nasal cannula oxygen Cardiovascular status: blood pressure returned to baseline and stable Postop Assessment: no apparent nausea or vomiting Anesthetic complications: no     Last Vitals:  Vitals:   06/11/17 1017 06/11/17 1037  BP: 130/72 138/78  Pulse: 74 76  Resp: 14 16  Temp:    SpO2: 99% 100%    Last Pain:  Vitals:   06/11/17 0957  TempSrc: Tympanic  PainSc:                  [name]

## 2017-06-11 NOTE — Anesthesia Preprocedure Evaluation (Signed)
Anesthesia Evaluation  Patient identified by MRN, date of birth, ID band Patient awake    Reviewed: Allergy & Precautions, H&P , NPO status , Patient's Chart, lab work & pertinent test results, reviewed documented beta blocker date and time   Airway Mallampati: II   Neck ROM: full    Dental  (+) Poor Dentition   Pulmonary neg pulmonary ROS,    Pulmonary exam normal        Cardiovascular hypertension, negative cardio ROS Normal cardiovascular exam Rhythm:regular Rate:Normal     Neuro/Psych negative neurological ROS  negative psych ROS   GI/Hepatic negative GI ROS, Neg liver ROS, GERD  Medicated,  Endo/Other  negative endocrine ROS  Renal/GU negative Renal ROS  negative genitourinary   Musculoskeletal   Abdominal   Peds  Hematology negative hematology ROS (+)   Anesthesia Other Findings Past Medical History: No date: Allergy     Comment:  mycins; cause diarrhea No date: Arthritis     Comment:  since 2007 No date: Breast screening, unspecified No date: Family history of colonic polyps 1987: History of cystitis No date: Hypertension     Comment:  since 2005 2008: Parathyroid disorder [facility])     Comment:  surgery 1966: Ulcer Past Surgical History: 2000: CARPAL TUNNEL RELEASE; Bilateral 06/17/2012: COLONOSCOPY     Comment:  A few two mm ulcers were found in the sigmoid colon, no               bleeding present,bx taken-path reporet on the few tiny               ulcers ,non specific 2008: parathyroid surgery 1984: TEMPOROMANDIBULAR JOINT ARTHROPLASTY; Left     Comment:  1989,1990-bilateral No date: TUBAL LIGATION   Reproductive/Obstetrics negative OB ROS                             Anesthesia Physical Anesthesia Plan  ASA: III  Anesthesia Plan: General   Post-op Pain Management:    Induction:   PONV Risk Score and Plan: 3 and Ondansetron, Dexamethasone, Midazolam and Propofol  infusion  Airway Management Planned:   Additional Equipment:   Intra-op Plan:   Post-operative Plan:   Informed Consent: I have reviewed the patients History and Physical, chart, labs and discussed the procedure including the risks, benefits and alternatives for the proposed anesthesia with the patient or authorized representative who has indicated his/her understanding and acceptance.   Dental Advisory Given  Plan Discussed with: CRNA  Anesthesia Plan Comments:        Anesthesia Quick Evaluation

## 2017-06-11 NOTE — Anesthesia Post-op Follow-up Note (Signed)
Anesthesia QCDR form completed.        

## 2017-06-12 ENCOUNTER — Encounter: Payer: Self-pay | Admitting: General Surgery

## 2017-06-12 ENCOUNTER — Telehealth: Payer: Self-pay | Admitting: Family Medicine

## 2017-06-12 NOTE — Telephone Encounter (Signed)
Pt states that you are aware of her back issue. A company will be faxing over a form requesting a back brace and she is getting you the okay to order it, (it will be free to her). Also, stated that Dr Evette Cristal will be faxing over her test results.

## 2017-06-13 NOTE — Telephone Encounter (Signed)
Pt stated that she needs it for driving or sitting for long periods of time and would like for you to sign the paperwork she stated that she has been borrowing her sisters

## 2017-06-13 NOTE — Telephone Encounter (Signed)
Left patient a voicemail to return my call

## 2017-06-13 NOTE — Telephone Encounter (Signed)
I can fill out a form for a lumbar support. I am sorry but I do not fill braces

## 2017-06-13 NOTE — Telephone Encounter (Signed)
I don't recommend back braces, they usually cause temporary relieve but in the long run her back muscles will get weaker and the pain will increase

## 2017-06-17 ENCOUNTER — Ambulatory Visit (INDEPENDENT_AMBULATORY_CARE_PROVIDER_SITE_OTHER): Payer: Medicare HMO

## 2017-06-17 ENCOUNTER — Telehealth: Payer: Self-pay | Admitting: Family Medicine

## 2017-06-17 DIAGNOSIS — Z23 Encounter for immunization: Secondary | ICD-10-CM

## 2017-06-17 NOTE — Telephone Encounter (Signed)
Pt would like a call back

## 2017-06-18 ENCOUNTER — Encounter: Payer: Self-pay | Admitting: Family Medicine

## 2017-06-18 ENCOUNTER — Ambulatory Visit (INDEPENDENT_AMBULATORY_CARE_PROVIDER_SITE_OTHER): Payer: Medicare HMO | Admitting: Family Medicine

## 2017-06-18 VITALS — BP 126/74 | HR 90 | Temp 98.1°F | Resp 16 | Ht 60.0 in | Wt 148.1 lb

## 2017-06-18 DIAGNOSIS — M1712 Unilateral primary osteoarthritis, left knee: Secondary | ICD-10-CM

## 2017-06-18 DIAGNOSIS — M5136 Other intervertebral disc degeneration, lumbar region: Secondary | ICD-10-CM | POA: Diagnosis not present
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
## 2017-06-23 ENCOUNTER — Ambulatory Visit: Payer: Medicare HMO | Attending: Family Medicine

## 2017-06-23 DIAGNOSIS — G8929 Other chronic pain: Secondary | ICD-10-CM

## 2017-06-23 DIAGNOSIS — M545 Low back pain: Secondary | ICD-10-CM | POA: Diagnosis not present

## 2017-06-23 DIAGNOSIS — R2689 Other abnormalities of gait and mobility: Secondary | ICD-10-CM
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
## 2017-06-24 DIAGNOSIS — M1712 Unilateral primary osteoarthritis, left knee: Secondary | ICD-10-CM | POA: Diagnosis not present

## 2017-06-24 DIAGNOSIS — M5136 Other intervertebral disc degeneration, lumbar region: Secondary | ICD-10-CM | POA: Diagnosis not present

## 2017-06-25 ENCOUNTER — Telehealth: Payer: Self-pay | Admitting: Family Medicine

## 2017-06-25 NOTE — Telephone Encounter (Signed)
[place] WITH [facility] AT [phone] IS ASKING IF YOU COULD CALL AND LET THEM KNOW IF YOU RECEIVED A REQUEST FOR ANKLE BRACE FOR THE PATIENT. SAID THAT IT WAS FAXED OVER YESTERDAY JUST WANTS TO KNOW IF RECEIVED. JUST CALL AND LET HER KNOW.

## 2017-06-30 ENCOUNTER — Ambulatory Visit: Payer: Medicare HMO

## 2017-06-30 DIAGNOSIS — G8929 Other chronic pain: Secondary | ICD-10-CM

## 2017-06-30 DIAGNOSIS — R2689 Other abnormalities of gait and mobility: Secondary | ICD-10-CM

## 2017-06-30 DIAGNOSIS — M545 Low back pain: Principal | ICD-10-CM
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
## 2017-07-02 ENCOUNTER — Other Ambulatory Visit: Payer: Self-pay | Admitting: Family Medicine

## 2017-07-02 NOTE — Telephone Encounter (Signed)
Patient requesting refill of Benicar HCT to [facility].

## 2017-07-07 ENCOUNTER — Ambulatory Visit (INDEPENDENT_AMBULATORY_CARE_PROVIDER_SITE_OTHER): Payer: Medicare HMO | Admitting: Family Medicine

## 2017-07-07 ENCOUNTER — Encounter: Payer: Self-pay | Admitting: Family Medicine

## 2017-07-07 VITALS — BP 120/60 | HR 86 | Resp 14 | Ht 60.0 in | Wt 148.4 lb

## 2017-07-07 DIAGNOSIS — M159 Polyosteoarthritis, unspecified: Secondary | ICD-10-CM

## 2017-07-07 DIAGNOSIS — G2581 Restless legs syndrome: Secondary | ICD-10-CM | POA: Diagnosis not present

## 2017-07-07 DIAGNOSIS — M5136 Other intervertebral disc degeneration, lumbar region: Secondary | ICD-10-CM

## 2017-07-07 DIAGNOSIS — J45909 Unspecified asthma, uncomplicated: Secondary | ICD-10-CM

## 2017-07-07 DIAGNOSIS — I10 Essential (primary) hypertension: Secondary | ICD-10-CM | POA: Diagnosis not present

## 2017-07-07 DIAGNOSIS — M150 Primary generalized (osteo)arthritis: Secondary | ICD-10-CM | POA: Diagnosis not present

## 2017-07-07 DIAGNOSIS — M51369 Other intervertebral disc degeneration, lumbar region without mention of lumbar back pain or lower extremity pain: Secondary | ICD-10-CM

## 2017-07-07 DIAGNOSIS — E559 Vitamin D deficiency, unspecified: Secondary | ICD-10-CM | POA: Diagnosis not present

## 2017-07-07 DIAGNOSIS — K219 Gastro-esophageal reflux disease without esophagitis: Secondary | ICD-10-CM

## 2017-07-07 DIAGNOSIS — E663 Overweight: Secondary | ICD-10-CM

## 2017-07-07 DIAGNOSIS — M8949 Other hypertrophic osteoarthropathy, multiple sites: Secondary | ICD-10-CM

## 2017-07-07 MED ORDER — OLMESARTAN MEDOXOMIL-HCTZ 20-12.5 MG PO TABS: 1.0000 | ORAL_TABLET | Freq: Every day | ORAL | 1 refills | Status: DC

## 2017-07-07 MED ORDER — AMLODIPINE BESYLATE 2.5 MG PO TABS: 2.5000 mg | ORAL_TABLET | Freq: Every day | ORAL | 1 refills | Status: DC

## 2017-07-07 MED ORDER — OMEPRAZOLE 40 MG PO CPDR: 40.0000 mg | DELAYED_RELEASE_CAPSULE | Freq: Every day | ORAL | 1 refills | Status: DC

## 2017-07-07 MED ORDER — MONTELUKAST SODIUM 10 MG PO TABS: 10.0000 mg | ORAL_TABLET | Freq: Every day | ORAL | 1 refills | Status: DC

## 2017-07-07 NOTE — Progress Notes (Signed)
Name: Ruth [name]   MRN: [id number]    DOB: 1945/12/10   Date:07/07/2017       Progress Note  Subjective  Chief Complaint  Chief Complaint  Patient presents with  . Hypertension    HPI  HTN: patient is compliant with medication, bp is at goal, no chest pain or palpitation. She states no recent dizziness.  Chronic allergic bronchitis: she is doing better, she was seen by Immunologist , she is on Xyzal and singulair, she still has occasional wheezing, she has an occasional cough with weather change  Vitamin D deficiency:history of hyperparathyroidism, and at one point she was osteoporotic, but last one just showed osteopenia, only on calcium and vitamin D at this time  GERD: she has tried weaning self off Omeprazole but it causes reflux symptoms. She is taking Meloxicam and advised to try weaning off that before trying to stop PPI, because of long term risk of PPI. She is down to Meloxicam once daily.  DDD: she takes Meloxicam and states that she is doing well, however when she is more active , especially on vacation ( long drives/bus tours) she can have acute worsening of symptoms, feels like she cannot move. She having PT states radiculitis symptoms is doing better  Chronic left knee pain/OA: she states that she takes Tylenol prn for pain, usually throbbing, happens daily and most of the time improves with Tylenol , however when more active she states immobilizing helps with the pain. No effusion, redness or increase in warmth noticed. No instability. She also has hypertrophy of middle fingers PIP  Insulin resistance: high triglycerides and elevated fasting insulin, with low HDL  Patient Active Problem List   Diagnosis Date Noted  . Vitamin D deficiency 10/31/2016  . Osteopenia of left hip 10/31/2016  . 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  . BPV (benign positional vertigo) 04/17/2016  . RLS (restless legs syndrome) 04/17/2016  . GERD (gastroesophageal reflux disease)  03/11/2016  . History of hyperparathyroidism 03/11/2016  . History of gastric ulcer 03/11/2016  . Essential hypertension 04/24/2015  . Primary osteoarthritis involving multiple joints 04/24/2015  . Lumbago 04/24/2015  . Family history of colonic polyps     Past Surgical History:  Procedure Laterality Date  . CARPAL TUNNEL RELEASE Bilateral 2000  . COLONOSCOPY  06/17/2012   A few two mm ulcers were found in the sigmoid colon, no bleeding present,bx taken-path reporet on the few tiny ulcers ,non specific  . COLONOSCOPY WITH PROPOFOL N/A 06/11/2017   Procedure: COLONOSCOPY WITH PROPOFOL;  Surgeon: Kieth Brightly, MD;  Location: [facility] ENDOSCOPY;  Service: Endoscopy;  Laterality: N/A;  . parathyroid surgery  2008  . 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 TUBAL LIGATION      Family History  Problem Relation Age of Onset  . Kidney disease Mother   . Ovarian cancer Mother   . Hypertension Father   . Stroke Father   . Lupus Sister     Social History   Social History  . Marital status: Married    Spouse name: N/A  . Number of children: N/A  . Years of education: N/A   Occupational History  . Not on file.   Social History Main Topics  . Smoking status: Never Smoker  . Smokeless tobacco: Never Used  . Alcohol use 0.0 oz/week     Comment: wine; 3-4 times per year  . Drug use: No  . Sexual activity: Yes    Partners: Male  Other Topics Concern  . Not on file   Social History Narrative  . No narrative on file     Current Outpatient Prescriptions:  .  acetaminophen (TYLENOL) 500 MG tablet, Take 1-2 tablets (500-1,000 mg total) by mouth every 8 (eight) hours as needed., Disp: 100 tablet, Rfl: 2 .  Ascorbic Acid (VITAMIN C) 1000 MG tablet, Take by mouth., Disp: , Rfl:  .  Biotin 1000 MCG CHEW, Chew by mouth., Disp: , Rfl:  .  Calcium-Vitamin D 600-200 MG-UNIT tablet, Take by mouth., Disp: , Rfl:  .  Cholecalciferol (VITAMIN D3) 1000  units CAPS, Take by mouth., Disp: , Rfl:  .  fluticasone (FLONASE) 50 MCG/ACT nasal spray, Place into the nose., Disp: , Rfl:  .  L-Lysine 500 MG TABS, Take by mouth., Disp: , Rfl:  .  levocetirizine (XYZAL) 5 MG tablet, Take 1 tablet (5 mg total) by mouth every evening., Disp: 30 tablet, Rfl: 0 .  magnesium oxide (MAG-OX) 400 MG tablet, Take by mouth., Disp: , Rfl:  .  meloxicam (MOBIC) 7.5 MG tablet, Take 1 tablet (7.5 mg total) by mouth daily., Disp: 90 tablet, Rfl: 1 .  montelukast (SINGULAIR) 10 MG tablet, Take 1 tablet (10 mg total) by mouth at bedtime., Disp: 90 tablet, Rfl: 0 .  Nutritional Supplements (JOINT FORMULA PO), Take 2 tablets by mouth daily. , Disp: , Rfl:  .  Potassium Gluconate ([employer] POTASSIUM GLUCONATE) 2 MEQ TABS, Take by mouth., Disp: , Rfl:  .  amLODipine (NORVASC) 2.5 MG tablet, Take 1 tablet (2.5 mg total) by mouth daily., Disp: 90 tablet, Rfl: 1 .  olmesartan-hydrochlorothiazide (BENICAR HCT) 20-12.5 MG tablet, Take 1 tablet by mouth daily., Disp: 90 tablet, Rfl: 1 .  omeprazole (PRILOSEC) 40 MG capsule, Take 1 capsule (40 mg total) by mouth daily., Disp: 90 capsule, Rfl: 1  Allergies  Allergen Reactions  . Ace Inhibitors Cough  . Aspirin Nausea Only     ROS  Constitutional: Negative for fever or weight change.  Respiratory: Negative for cough and shortness of breath.   Cardiovascular: Negative for chest pain or palpitations.  Gastrointestinal: Negative for abdominal pain, no bowel changes.  Musculoskeletal: Negative for gait problem, positive for  joint swelling - middle fingers both hands  Skin: Negative for rash.  Neurological: Negative for dizziness (  intermittent vertigo)  or headache.  No other specific complaints in a complete review of systems (except as listed in HPI above).  Objective  Vitals:   07/07/17 0741  BP: 120/60  Pulse: 86  Resp: 14  SpO2: 96%  Weight: 148 lb 6.4 oz (67.3 kg)  Height: 5' (1.524 m)    Body mass index is 28.98  kg/m.  Physical Exam  Constitutional: Patient appears well-developed and well-nourished. Overweight.  No distress.  HEENT: head atraumatic, normocephalic, pupils equal and reactive to light,  neck supple, throat within normal limits Cardiovascular: Normal rate, regular rhythm and normal heart sounds.  No murmur heard. No BLE edema. Pulmonary/Chest: Effort normal and breath sounds normal. No respiratory distress. Abdominal: Soft.  There is no tenderness. Psychiatric: Patient has a normal mood and affect. behavior is normal. Judgment and thought content normal. Muscular skeletal: some hypertrophy on PIP of both middle fingers. No pain during palpation of lumbar spine, negative straight leg raise, normal rom  PHQ2/9: Depression screen [facility] 2/9 06/18/2017 03/05/2017 10/31/2016 06/18/2016 03/11/2016  Decreased Interest 0 0 0 0 0  Down, Depressed, Hopeless 0 0 0 0 0  PHQ -  2 Score 0 0 0 0 0     Fall Risk: Fall Risk  06/18/2017 03/05/2017 10/31/2016 06/18/2016 03/11/2016  Falls in the past year? No No No No Yes  Number falls in past yr: - - - - 1  Injury with Fall? - - - - No      Assessment & Plan  1. Essential hypertension  - olmesartan-hydrochlorothiazide (BENICAR HCT) 20-12.5 MG tablet; Take 1 tablet by mouth daily.  Dispense: 90 tablet; Refill: 1 - amLODipine (NORVASC) 2.5 MG tablet; Take 1 tablet (2.5 mg total) by mouth daily.  Dispense: 90 tablet; Refill: 1  2. Gastroesophageal reflux disease without esophagitis  Discussed long risk use of PPI, she will try to wean self off medication  - omeprazole (PRILOSEC) 40 MG capsule; Take 1 capsule (40 mg total) by mouth daily.  Dispense: 90 capsule; Refill: 1  3. Primary osteoarthritis involving multiple joints  Stable, trying to wean self off Meloxiam   4. Vitamin D deficiency  Continue vitamin D otc  5. DDD (degenerative disc disease), lumbar  Going to PT  6. RLS (restless legs syndrome)  Better with PT also  7. Overweight (BMI  25.0-29.9)  Discussed with the patient the risk posed by an increased BMI. Discussed importance of portion control, calorie counting and at least 150 minutes of physical activity weekly. Avoid sweet beverages and drink more water. Eat at least 6 servings of fruit and vegetables daily

## 2017-07-09 ENCOUNTER — Ambulatory Visit: Payer: Medicare HMO

## 2017-07-09 DIAGNOSIS — M545 Low back pain: Secondary | ICD-10-CM | POA: Diagnosis not present

## 2017-07-09 DIAGNOSIS — R2689 Other abnormalities of gait and mobility: Secondary | ICD-10-CM

## 2017-07-09 DIAGNOSIS — G8929 Other chronic pain: Secondary | ICD-10-CM
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
## 2017-07-15 ENCOUNTER — Ambulatory Visit: Payer: Medicare HMO

## 2017-07-15 DIAGNOSIS — R2689 Other abnormalities of gait and mobility: Secondary | ICD-10-CM

## 2017-07-15 DIAGNOSIS — M545 Low back pain: Principal | ICD-10-CM

## 2017-07-15 DIAGNOSIS — G8929 Other chronic pain: Secondary | ICD-10-CM
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
## 2017-07-21 ENCOUNTER — Ambulatory Visit: Payer: Medicare HMO

## 2017-07-30 DIAGNOSIS — I10 Essential (primary) hypertension: Secondary | ICD-10-CM | POA: Diagnosis not present

## 2017-07-30 DIAGNOSIS — E7800 Pure hypercholesterolemia, unspecified: Secondary | ICD-10-CM | POA: Diagnosis not present

## 2017-07-30 DIAGNOSIS — Z6828 Body mass index (BMI) 28.0-28.9, adult: Secondary | ICD-10-CM | POA: Diagnosis not present

## 2017-07-30 DIAGNOSIS — J45909 Unspecified asthma, uncomplicated: Secondary | ICD-10-CM | POA: Diagnosis not present

## 2017-07-30 DIAGNOSIS — K219 Gastro-esophageal reflux disease without esophagitis: Secondary | ICD-10-CM | POA: Diagnosis not present

## 2017-07-30 DIAGNOSIS — Z0000 Encounter for general adult medical examination without abnormal findings: Secondary | ICD-10-CM | POA: Diagnosis not present

## 2017-07-30 DIAGNOSIS — E663 Overweight: Secondary | ICD-10-CM | POA: Diagnosis not present

## 2017-09-08 ENCOUNTER — Encounter: Payer: Self-pay | Admitting: General Surgery

## 2017-09-17 ENCOUNTER — Ambulatory Visit: Payer: Medicare HMO | Admitting: Family Medicine

## 2017-09-18 ENCOUNTER — Ambulatory Visit (INDEPENDENT_AMBULATORY_CARE_PROVIDER_SITE_OTHER): Payer: Medicare HMO | Admitting: Family Medicine

## 2017-09-18 ENCOUNTER — Encounter: Payer: Self-pay | Admitting: Family Medicine

## 2017-09-18 VITALS — BP 110/58 | HR 96 | Temp 97.9°F | Resp 14 | Wt 145.8 lb

## 2017-09-18 DIAGNOSIS — R05 Cough: Secondary | ICD-10-CM

## 2017-09-18 DIAGNOSIS — J069 Acute upper respiratory infection, unspecified: Secondary | ICD-10-CM | POA: Diagnosis not present

## 2017-09-18 DIAGNOSIS — J45998 Other asthma: Secondary | ICD-10-CM

## 2017-09-18 DIAGNOSIS — R059 Cough, unspecified: Secondary | ICD-10-CM

## 2017-09-18 DIAGNOSIS — R0789 Other chest pain: Secondary | ICD-10-CM | POA: Diagnosis not present

## 2017-09-18 MED ORDER — MOMETASONE FUROATE 50 MCG/ACT NA SUSP: 2.0000 | Freq: Every day | NASAL | 12 refills | Status: DC

## 2017-09-18 MED ORDER — BENZONATATE 100 MG PO CAPS: 100.0000 mg | ORAL_CAPSULE | Freq: Three times a day (TID) | ORAL | 0 refills | Status: DC | PRN

## 2017-09-18 NOTE — Patient Instructions (Addendum)
Drink plenty of water.  You may take tylenol (Acetaminophen) as needed for pain, fevers, headaches, or body aches.  Use albuterol inhaler as needed for shortness of breath or wheezing.   Upper Respiratory Infection, Adult Most upper respiratory infections (URIs) are caused by a virus. A URI affects the nose, throat, and upper air passages. The most common type of URI is often called "the common cold." Follow these instructions at home:  Take medicines only as told by your doctor.  Gargle warm saltwater or take cough drops to comfort your throat as told by your doctor.  Use a warm mist humidifier or inhale steam from a shower to increase air moisture. This may make it easier to breathe.  Drink enough fluid to keep your pee (urine) clear or pale yellow.  Eat soups and other clear broths.  Have a healthy diet.  Rest as needed.  Go back to work when your fever is gone or your doctor says it is okay. ? You may need to stay home longer to avoid giving your URI to others. ? You can also wear a face mask and wash your hands often to prevent spread of the virus.  Use your inhaler more if you have asthma.  Do not use any tobacco products, including cigarettes, chewing tobacco, or electronic cigarettes. If you need help quitting, ask your doctor. Contact a doctor if:  You are getting worse, not better.  Your symptoms are not helped by medicine.  You have chills.  You are getting more short of breath.  You have brown or red mucus.  You have yellow or brown discharge from your nose.  You have pain in your face, especially when you bend forward.  You have a fever.  You have puffy (swollen) neck glands.  You have pain while swallowing.  You have [place] areas in the back of your throat. Get help right away if:  You have very bad or constant: ? Headache. ? Ear pain. ? Pain in your forehead, behind your eyes, and over your cheekbones (sinus pain). ? Chest pain.  You have  long-lasting (chronic) lung disease and any of the following: ? Wheezing. ? Long-lasting cough. ? Coughing up blood. ? A change in your usual mucus.  You have a stiff neck.  You have changes in your: ? Vision. ? Hearing. ? Thinking. ? Mood. This information is not intended to replace advice given to you by your health care provider. Make sure you discuss any questions you have with your health care provider. Document Released: 02/26/2008 Document Revised: 05/12/2016 Document Reviewed: 12/15/2013 Elsevier Interactive Patient Education  2018 [employer].  [employer] Vaporizer A cool mist vaporizer is a device that releases a cool mist into the air. If you have a cough or a cold, using a vaporizer may help relieve your symptoms. The mist adds moisture to the air, which may help thin your mucus and make it less sticky. When your mucus is thin and less sticky, it easier for you to breathe and to cough up secretions. Do not use a vaporizer if you are allergic to mold. Follow these instructions at home:  Follow the instructions that come with the vaporizer.  Do not use anything other than distilled water in the vaporizer.  Do not run the vaporizer all of the time. Doing that can cause mold or bacteria to grow in the vaporizer.  Clean the vaporizer after each time that you use it.  Clean and dry the vaporizer  well before storing it.  Stop using the vaporizer if your breathing symptoms get worse. This information is not intended to replace advice given to you by your health care provider. Make sure you discuss any questions you have with your health care provider. Document Released: 06/06/2004 Document Revised: 03/29/2016 Document Reviewed: 12/09/2015 Elsevier Interactive Patient Education  2018 Elsevier Inc.   Chest Wall Pain Chest wall pain is pain in or around the bones and muscles of your chest. Sometimes, an injury causes this pain. Sometimes, the cause may not be known. This  pain may take several weeks or longer to get better. Follow these instructions at home: Pay attention to any changes in your symptoms. Take these actions to help with your pain:  Rest as told by your doctor.  Avoid activities that cause pain. Try not to use your chest, belly (abdominal), or side muscles to lift heavy things.  If directed, apply ice to the painful area: ? Put ice in a plastic bag. ? Place a towel between your skin and the bag. ? Leave the ice on for 20 minutes, 2-3 times per day.  Take over-the-counter and prescription medicines only as told by your doctor.  Do not use tobacco products, including cigarettes, chewing tobacco, and e-cigarettes. If you need help quitting, ask your doctor.  Keep all follow-up visits as told by your doctor. This is important.  Contact a doctor if:  You have a fever.  Your chest pain gets worse.  You have new symptoms. Get help right away if:  You feel sick to your stomach (nauseous) or you throw up (vomit).  You feel sweaty or light-headed.  You have a cough with phlegm (sputum) or you cough up blood.  You are short of breath. This information is not intended to replace advice given to you by your health care provider. Make sure you discuss any questions you have with your health care provider. Document Released: 02/26/2008 Document Revised: 02/15/2016 Document Reviewed: 12/05/2014 Elsevier Interactive Patient Education  [employer].

## 2017-09-18 NOTE — Progress Notes (Signed)
Name: Ruth [name]   MRN: [id number]    DOB: 1946/02/25   Date:09/18/2017       Progress Note  Subjective  Chief Complaint  Chief Complaint  Patient presents with  . Sore Throat  . Cough  . Chest Pain    HPI  Pt presents with 4 days of cough, sore throat, sneezing, fatigue, body aches, some decreased appetite.  Fever of 102F x2 days, last night fever went away.  Taking Tylenol; she is afraid to take OTC medications because she has HTN and wants to avoid interaction.  Endorses chest soreness with coughing and deep inspiration.  Denies shortness of breath, vomiting, diarrhea, constipation, abdominal pain.  Has hx chronic allergic bronchitis, has not been using her albuterol inhaler while sick (was given albuterol inhaler by allergist about 1 month ago).  She is already taking Xyzal daily.  Patient Active Problem List   Diagnosis Date Noted  . Vitamin D deficiency 10/31/2016  . Osteopenia of left hip 10/31/2016  . 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  . BPV (benign positional vertigo) 04/17/2016  . RLS (restless legs syndrome) 04/17/2016  . GERD (gastroesophageal reflux disease) 03/11/2016  . History of hyperparathyroidism 03/11/2016  . History of gastric ulcer 03/11/2016  . Essential hypertension 04/24/2015  . Primary osteoarthritis involving multiple joints 04/24/2015  . Lumbago 04/24/2015  . Family history of colonic polyps     Social History   Tobacco Use  . Smoking status: Never Smoker  . Smokeless tobacco: Never Used  Substance Use Topics  . Alcohol use: Yes    Alcohol/week: 0.0 oz    Comment: wine; 3-4 times per year     Current Outpatient Medications:  .  acetaminophen (TYLENOL) 500 MG tablet, Take 1-2 tablets (500-1,000 mg total) by mouth every 8 (eight) hours as needed., Disp: 100 tablet, Rfl: 2 .  amLODipine (NORVASC) 2.5 MG tablet, Take 1 tablet (2.5 mg total) by mouth daily., Disp: 90 tablet, Rfl: 1 .  Ascorbic Acid (VITAMIN C) 1000 MG tablet, Take by  mouth., Disp: , Rfl:  .  Biotin 1000 MCG CHEW, Chew by mouth., Disp: , Rfl:  .  Calcium-Vitamin D 600-200 MG-UNIT tablet, Take by mouth., Disp: , Rfl:  .  Cholecalciferol (VITAMIN D3) 1000 units CAPS, Take by mouth., Disp: , Rfl:  .  L-Lysine 500 MG TABS, Take by mouth., Disp: , Rfl:  .  levocetirizine (XYZAL) 5 MG tablet, Take 1 tablet (5 mg total) by mouth every evening., Disp: 30 tablet, Rfl: 0 .  magnesium oxide (MAG-OX) 400 MG tablet, Take by mouth., Disp: , Rfl:  .  montelukast (SINGULAIR) 10 MG tablet, Take 1 tablet (10 mg total) by mouth at bedtime., Disp: 90 tablet, Rfl: 1 .  Nutritional Supplements (JOINT FORMULA PO), Take 2 tablets by mouth daily. , Disp: , Rfl:  .  olmesartan-hydrochlorothiazide (BENICAR HCT) 20-12.5 MG tablet, Take 1 tablet by mouth daily., Disp: 90 tablet, Rfl: 1 .  omeprazole (PRILOSEC) 40 MG capsule, Take 1 capsule (40 mg total) by mouth daily., Disp: 90 capsule, Rfl: 1 .  Potassium Gluconate ([employer] POTASSIUM GLUCONATE) 2 MEQ TABS, Take by mouth., Disp: , Rfl:  .  benzonatate (TESSALON) 100 MG capsule, Take 1-2 capsules (100-200 mg total) by mouth 3 (three) times daily as needed for cough., Disp: 30 capsule, Rfl: 0 .  meloxicam (MOBIC) 7.5 MG tablet, Take 1 tablet (7.5 mg total) by mouth daily. (Patient not taking: Reported on 09/18/2017), Disp: 90 tablet, Rfl: 1 .  mometasone (NASONEX) 50 MCG/ACT nasal spray, Place 2 sprays into the nose daily., Disp: 17 g, Rfl: 12  Allergies  Allergen Reactions  . Ace Inhibitors Cough  . Aspirin Nausea Only    ROS  Constitutional: Positive for fever; negative weight change.  Respiratory: See HPI. Negative for shortness of breath  Cardiovascular: Negative for chest pain or palpitations.  Gastrointestinal: Negative for abdominal pain, no bowel changes.  Musculoskeletal: Negative for gait problem or joint swelling.  Skin: Negative for rash.  Neurological: Negative for dizziness.  Positive headache.  No other specific  complaints in a complete review of systems (except as listed in HPI above).  Objective  Vitals:   09/18/17 0816 09/18/17 0835  BP: (!) 110/58   Pulse: (!) 104 96  Resp: 14   Temp: 97.9 F (36.6 C)   TempSrc: Oral   SpO2: 98%   Weight: 145 lb 12.8 oz (66.1 kg)    Body mass index is 28.47 kg/m.  Nursing Note and Vital Signs reviewed.  Physical Exam  Constitutional: Patient appears well-developed and well-nourished.  No distress.  HEENT: head atraumatic, normocephalic, pupils equal and reactive to light, EOM's intact, TM's without erythema or bulging, no maxillary or frontal sinus pain on palpation, neck supple without lymphadenopathy, oropharynx mildly erythematous and moist without exudate Cardiovascular: Normal rate, regular rhythm, S1/S2 present.  No murmur or rub heard. No BLE edema. Reproducible chest soreness on palpation to lower sternum. Pulmonary/Chest: Effort normal and breath sounds clear. No respiratory distress or retractions. Abdominal: Soft and non-tender. Psychiatric: Patient has a normal mood and affect. behavior is normal. Judgment and thought content normal.  No results found for this or any previous visit (from the past 2160 hour(s)).   Assessment & Plan  1. Upper respiratory tract infection, unspecified type - benzonatate (TESSALON) 100 MG capsule; Take 1-2 capsules (100-200 mg total) by mouth 3 (three) times daily as needed for cough.  Dispense: 30 capsule; Refill: 0 - mometasone (NASONEX) 50 MCG/ACT nasal spray; Place 2 sprays into the nose daily.  Dispense: 17 g; Refill: 12 - Advised appears to be viral in nature at this time, discussed "watch and wait" as the illness has been present for only 4 day and examination is reassuring.  Drink plenty of fluids, get plenty of rest, take tylenol as needed along with prescriptions as above. Return precautions/red flags as below and per AVS.  2. Chronic allergic bronchitis - Continue daily medications; use Albuterol  PRN for wheezing or shortness of breath  3. Cough - benzonatate (TESSALON) 100 MG capsule; Take 1-2 capsules (100-200 mg total) by mouth 3 (three) times daily as needed for cough.  Dispense: 30 capsule; Refill: 0  4. Chest wall pain - Advised to take Tylenol as needed. Reassurance provided that this is reproducible soreness on palpation and with inspiration/cough.  Strict emergency care precautions if pain changes is discussed in detail.  May splint with coughing to help with pain.  -Red flags and when to present for emergency care or RTC including fever >101.34F, chest pain, shortness of breath unrelieved by inhaler, new/worsening/un-resolving symptoms, reviewed with patient at time of visit. Follow up and care instructions discussed and provided in AVS.

## 2017-11-03 DIAGNOSIS — H43813 Vitreous degeneration, bilateral: Secondary | ICD-10-CM | POA: Diagnosis not present

## 2017-11-05 ENCOUNTER — Encounter: Payer: Self-pay | Admitting: Family Medicine

## 2017-11-05 ENCOUNTER — Ambulatory Visit (INDEPENDENT_AMBULATORY_CARE_PROVIDER_SITE_OTHER): Payer: Medicare HMO | Admitting: Family Medicine

## 2017-11-05 VITALS — BP 120/66 | HR 93 | Temp 98.1°F | Resp 14 | Ht 60.0 in | Wt 147.3 lb

## 2017-11-05 DIAGNOSIS — E559 Vitamin D deficiency, unspecified: Secondary | ICD-10-CM | POA: Diagnosis not present

## 2017-11-05 DIAGNOSIS — R252 Cramp and spasm: Secondary | ICD-10-CM | POA: Diagnosis not present

## 2017-11-05 DIAGNOSIS — Z0001 Encounter for general adult medical examination with abnormal findings: Secondary | ICD-10-CM | POA: Diagnosis not present

## 2017-11-05 DIAGNOSIS — Z1231 Encounter for screening mammogram for malignant neoplasm of breast: Secondary | ICD-10-CM

## 2017-11-05 DIAGNOSIS — R05 Cough: Secondary | ICD-10-CM

## 2017-11-05 DIAGNOSIS — M85852 Other specified disorders of bone density and structure, left thigh: Secondary | ICD-10-CM

## 2017-11-05 DIAGNOSIS — Z8639 Personal history of other endocrine, nutritional and metabolic disease: Secondary | ICD-10-CM | POA: Diagnosis not present

## 2017-11-05 DIAGNOSIS — E781 Pure hyperglyceridemia: Secondary | ICD-10-CM | POA: Diagnosis not present

## 2017-11-05 DIAGNOSIS — R059 Cough, unspecified: Secondary | ICD-10-CM

## 2017-11-05 DIAGNOSIS — G2581 Restless legs syndrome: Secondary | ICD-10-CM

## 2017-11-05 DIAGNOSIS — Z0000 Encounter for general adult medical examination without abnormal findings: Secondary | ICD-10-CM

## 2017-11-05 DIAGNOSIS — I10 Essential (primary) hypertension: Secondary | ICD-10-CM

## 2017-11-05 MED ORDER — ALBUTEROL SULFATE HFA 108 (90 BASE) MCG/ACT IN AERS: 2.0000 | INHALATION_SPRAY | Freq: Four times a day (QID) | RESPIRATORY_TRACT | 0 refills | Status: DC | PRN

## 2017-11-05 MED ORDER — AMLODIPINE BESYLATE 2.5 MG PO TABS: 2.5000 mg | ORAL_TABLET | Freq: Every day | ORAL | 1 refills | Status: DC

## 2017-11-05 MED ORDER — OLMESARTAN MEDOXOMIL-HCTZ 20-12.5 MG PO TABS: 1.0000 | ORAL_TABLET | Freq: Every day | ORAL | 1 refills | Status: DC

## 2017-11-05 NOTE — Patient Instructions (Signed)
Preventive Care 65 Years and Older, Female Preventive care refers to lifestyle choices and visits with your health care provider that can promote health and wellness. What does preventive care include?  A yearly physical exam. This is also called an annual well check.  Dental exams once or twice a year.  Routine eye exams. Ask your health care provider how often you should have your eyes checked.  Personal lifestyle choices, including: ? Daily care of your teeth and gums. ? Regular physical activity. ? Eating a healthy diet. ? Avoiding tobacco and drug use. ? Limiting alcohol use. ? Practicing safe sex. ? Taking low-dose aspirin every day. ? Taking vitamin and mineral supplements as recommended by your health care provider. What happens during an annual well check? The services and screenings done by your health care provider during your annual well check will depend on your age, overall health, lifestyle risk factors, and family history of disease. Counseling Your health care provider may ask you questions about your:  Alcohol use.  Tobacco use.  Drug use.  Emotional well-being.  Home and relationship well-being.  Sexual activity.  Eating habits.  History of falls.  Memory and ability to understand (cognition).  Work and work environment.  Reproductive health.  Screening You may have the following tests or measurements:  Height, weight, and BMI.  Blood pressure.  Lipid and cholesterol levels. These may be checked every 5 years, or more frequently if you are over 50 years old.  Skin check.  Lung cancer screening. You may have this screening every year starting at age 55 if you have a 30-pack-year history of smoking and currently smoke or have quit within the past 15 years.  Fecal occult blood test (FOBT) of the stool. You may have this test every year starting at age 50.  Flexible sigmoidoscopy or colonoscopy. You may have a sigmoidoscopy every 5 years or  a colonoscopy every 10 years starting at age 50.  Hepatitis C blood test.  Hepatitis B blood test.  Sexually transmitted disease (STD) testing.  Diabetes screening. This is done by checking your blood sugar (glucose) after you have not eaten for a while (fasting). You may have this done every 1-3 years.  Bone density scan. This is done to screen for osteoporosis. You may have this done starting at age 65.  Mammogram. This may be done every 1-2 years. Talk to your health care provider about how often you should have regular mammograms.  Talk with your health care provider about your test results, treatment options, and if necessary, the need for more tests. Vaccines Your health care provider may recommend certain vaccines, such as:  Influenza vaccine. This is recommended every year.  Tetanus, diphtheria, and acellular pertussis (Tdap, Td) vaccine. You may need a Td booster every 10 years.  Varicella vaccine. You may need this if you have not been vaccinated.  Zoster vaccine. You may need this after age 60.  Measles, mumps, and rubella (MMR) vaccine. You may need at least one dose of MMR if you were born in 1957 or later. You may also need a second dose.  Pneumococcal 13-valent conjugate (PCV13) vaccine. One dose is recommended after age 65.  Pneumococcal polysaccharide (PPSV23) vaccine. One dose is recommended after age 65.  Meningococcal vaccine. You may need this if you have certain conditions.  Hepatitis A vaccine. You may need this if you have certain conditions or if you travel or work in places where you may be exposed to hepatitis   A.  Hepatitis B vaccine. You may need this if you have certain conditions or if you travel or work in places where you may be exposed to hepatitis B.  Haemophilus influenzae type b (Hib) vaccine. You may need this if you have certain conditions.  Talk to your health care provider about which screenings and vaccines you need and how often you  need them. This information is not intended to replace advice given to you by your health care provider. Make sure you discuss any questions you have with your health care provider. Document Released: 10/06/2015 Document Revised: 05/29/2016 Document Reviewed: 07/11/2015 [employer] Interactive Patient Education  2018 [employer]  

## 2017-11-05 NOTE — Progress Notes (Signed)
. Patient: Ruth [name], Female    DOB: 10/09/1945, 72 y.o.   MRN: [id number]  Visit Date: 11/05/2017  Today's Provider: Ruel Favors, MD   Chief Complaint  Patient presents with  . Annual Exam    Subjective:    HPI Ruth [name] is a 72 y.o. female who presents today for her Subsequent Annual Wellness Visit.   HTN: patient is compliant with medication, bp is at goal, no chest pain or palpitation. She has a history of BPPV but brief and not related to orthostatic   Chronic allergic bronchitis: she is doing better, she was seen by Immunologist , she is on Xyzal and singulair, she still has occasional wheezing and dry cough, she needs refill of inhaler, she states she also has some Advair but not on her records today, advised to follow up with Dr. [place] Callas sooner  Vitamin D deficiency:history of hyperparathyroidism, and at one point she was osteoporotic, but last one just showed osteopenia, only on calcium and vitamin D at this time . She has taken fosamax in the past   GERD: she has tried weaning self off Omeprazole but it causes reflux symptoms. She is off Meloxicam  DDD: she takes Meloxicam and states that she is doing well, however when she is more active , especially on vacation ( long drives/bus tours) she can have acute worsening of symptoms, feels like she cannot move. She having PT states radiculitis symptoms is doing better  Chronic left knee pain/OA: she states that she takes Tylenol prn for pain, usually throbbing, happens daily and most of the time improves with Tylenol , however when more active she states immobilizing helps with the pain. No effusion, redness or increase in warmth noticed. No instability.   RLS/ leg cramps: getting worse takes magnesium, and potassium otc still has cramps, symptoms are worse at night, we will check labs  Insulin resistance: high triglycerides and elevated fasting insulin, with low HDL. We will recheck labs  Patient/Caregiver  input:  Doing well except continues to cough, but better  Review of Systems  Constitutional: Negative for fever or weight change.  Respiratory: Negative for cough and shortness of breath.   Cardiovascular: Negative for chest pain or palpitations.  Gastrointestinal: Negative for abdominal pain, no bowel changes.  Musculoskeletal: Negative for gait problem or joint swelling.  Skin: Negative for rash.  Neurological: Negative for dizziness or headache.  No other specific complaints in a complete review of systems (except as listed in HPI above).  Past Medical History:  Diagnosis Date  . Allergy    mycins; cause diarrhea  . Arthritis    since 2007  . Breast screening, unspecified   . Family history of colonic polyps   . History of cystitis 1987  . Hypertension    since 2005  . Parathyroid disorder [facility]) 2008   surgery  . Ulcer 1966    Past Surgical History:  Procedure Laterality Date  . CARPAL TUNNEL RELEASE Bilateral 2000  . COLONOSCOPY  06/17/2012   A few two mm ulcers were found in the sigmoid colon, no bleeding present,bx taken-path reporet on the few tiny ulcers ,non specific  . COLONOSCOPY WITH PROPOFOL N/A 06/11/2017   Procedure: COLONOSCOPY WITH PROPOFOL;  Surgeon: Kieth Brightly, MD;  Location: [facility] ENDOSCOPY;  Service: Endoscopy;  Laterality: N/A;  . parathyroid surgery  2008  . 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 TUBAL LIGATION      Family History  Problem  Relation Age of Onset  . Kidney disease Mother   . Ovarian cancer Mother   . Hypertension Father   . Stroke Father   . Lupus Sister     Social History   Socioeconomic History  . Marital status: Married    Spouse name: Not on file  . Number of children: Not on file  . Years of education: Not on file  . Highest education level: Not on file  Social Needs  . Financial resource strain: Not on file  . Food insecurity - worry: Not on file  . Food insecurity -  inability: Not on file  . Transportation needs - medical: Not on file  . Transportation needs - non-medical: Not on file  Occupational History  . Not on file  Tobacco Use  . Smoking status: Never Smoker  . Smokeless tobacco: Never Used  Substance and Sexual Activity  . Alcohol use: Yes    Alcohol/week: 0.0 oz    Comment: wine; 3-4 times per year  . Drug use: No  . Sexual activity: Yes    Partners: Male  Other Topics Concern  . Not on file  Social History Narrative  . Not on file    Outpatient Encounter Medications as of 11/05/2017  Medication Sig Note  . acetaminophen (TYLENOL) 500 MG tablet Take 1-2 tablets (500-1,000 mg total) by mouth every 8 (eight) hours as needed.   Marland Kitchen amLODipine (NORVASC) 2.5 MG tablet Take 1 tablet (2.5 mg total) by mouth daily.   . Ascorbic Acid (VITAMIN C) 1000 MG tablet Take by mouth. 03/11/2016: Received from: [facility]  . Biotin 1000 MCG CHEW Chew by mouth.   . Calcium-Vitamin D 600-200 MG-UNIT tablet Take by mouth. 03/11/2016: Received from: [facility]  . Cholecalciferol (VITAMIN D3) 1000 units CAPS Take by mouth. 03/11/2016: Received from: [facility]  . L-Lysine 500 MG TABS Take by mouth. 03/11/2016: Received from: [facility]  . levocetirizine (XYZAL) 5 MG tablet Take 1 tablet (5 mg total) by mouth every evening.   . magnesium oxide (MAG-OX) 400 MG tablet Take by mouth. 03/11/2016: Received from: [facility]  . meloxicam (MOBIC) 7.5 MG tablet Take 1 tablet (7.5 mg total) by mouth daily.   . mometasone (NASONEX) 50 MCG/ACT nasal spray Place 2 sprays into the nose daily.   . montelukast (SINGULAIR) 10 MG tablet Take 1 tablet (10 mg total) by mouth at bedtime.   . Nutritional Supplements (JOINT FORMULA PO) Take 2 tablets by mouth daily.    Marland Kitchen olmesartan-hydrochlorothiazide (BENICAR HCT) 20-12.5 MG tablet Take 1 tablet by mouth daily.   Marland Kitchen omeprazole (PRILOSEC) 40 MG  capsule Take 1 capsule (40 mg total) by mouth daily.   . Potassium Gluconate ([employer] POTASSIUM GLUCONATE) 2 MEQ TABS Take by mouth. 03/11/2016: Received from: [facility]  . [DISCONTINUED] benzonatate (TESSALON) 100 MG capsule Take 1-2 capsules (100-200 mg total) by mouth 3 (three) times daily as needed for cough.   Marland Kitchen albuterol (VENTOLIN HFA) 108 (90 Base) MCG/ACT inhaler Inhale 2 puffs into the lungs every 6 (six) hours as needed for wheezing or shortness of breath.    No facility-administered encounter medications on file as of 11/05/2017.     Allergies  Allergen Reactions  . Ace Inhibitors Cough  . Aspirin Nausea Only    Care Team Updated in EHR: Yes  Last Vision Exam: 2019  Wears corrective lenses: Yes Last Dental Exam: in 2017  Last Hearing Exam: not sure Wears Hearing Aids: no  Functional Ability / Safety Screening 1.  Was the timed Get Up and Go test shorter than 30 seconds?  yes 2.  Does the patient need help with the phone, transportation, shopping,      preparing meals, housework, laundry, medications, or managing money?  no 3.  Is the patient's home free of loose throw rugs in walkways, pet beds, electrical cords, etc?   yes ( has rubber matting)       Grab bars in the bathroom? no      Handrails on the stairs?   yes      Adequate lighting?   yes 4.  Has the patient noticed any hearing difficulties?   No  Diet Recall and Exercise Regimen:  Needs to increase daily intake of fruit and vegetables Needs to increase exercise  Advanced Care Planning: A voluntary discussion about advance care planning including the explanation and discussion of advance directives.  Discussed health care proxy and Living will, and the patient was able to identify a health care proxy as Gelene Mink.  Patient does not have a living will at present time.  Does patient have a HCPOA?    no If yes, name and contact information:  Does patient have a living will or MOST form?  no  Cancer  Screenings:  Lung: Low Dose CT Chest recommended if Age 49-80 years, 30 pack-year currently smoking OR have quit w/in 15years. Patient does not qualify. Breast:  Up to date on Mammogram? Yes  Up to date of Bone Density/Dexa? Yes Colon: 2018  Additional Screenings:  Hepatitis B/HIV/Syphillis: not interested  Hepatitis C Screening: up to date  Intimate Partner Violence: negative screen   Objective:   Vitals: BP 120/66 (BP Location: Left Arm, Patient Position: Sitting, Cuff Size: Normal)   Pulse 93   Temp 98.1 F (36.7 C) (Oral)   Resp 14   Ht 5' (1.524 m)   Wt 147 lb 4.8 oz (66.8 kg)   SpO2 97%   BMI 28.77 kg/m  Body mass index is 28.77 kg/m.  No exam data present  Physical Exam Constitutional: Patient appears well-developed and well-nourished. Overweight. No distress.  HEENT: head atraumatic, normocephalic, pupils equal and reactive to light,  neck supple, throat within normal limits Cardiovascular: Normal rate, regular rhythm and normal heart sounds.  No murmur heard. No BLE edema. Pulmonary/Chest: Effort normal and breath sounds normal. No respiratory distress. Abdominal: Soft.  There is no tenderness. Psychiatric: Patient has a normal mood and affect. behavior is normal. Judgment and thought content normal.  Cognitive Testing - 6-CIT  Correct? Score   What year is it? yes 0 Yes = 0    No = 4  What month is it? yes 0 Yes = 0    No = 3  Remember:     [place] [address], [place]     What time is it? yes 0 Yes = 0    No = 3  Count backwards from 20 to 1 yes 0 Correct = 0    1 error = 2   More than 1 error = 4  Say the months of the year in reverse. yes 0 Correct = 0    1 error = 2   More than 1 error = 4  What address did I ask you to remember? yes 1 Correct = 0  1 error = 2    2 error = 4  3 error = 6    4 error = 8    All wrong = 10       TOTAL SCORE  1/28   Interpretation:  Normal  Normal (0-7) Abnormal (8-28)   Fall Risk: Fall Risk  11/05/2017 09/18/2017  06/18/2017 03/05/2017 10/31/2016  Falls in the past year? No No No No No  Number falls in past yr: - - - - -  Injury with Fall? - - - - -    Depression Screen Depression screen [facility] 2/9 11/05/2017 06/18/2017 03/05/2017 10/31/2016 06/18/2016  Decreased Interest 0 0 0 0 0  Down, Depressed, Hopeless 0 0 0 0 0  PHQ - 2 Score 0 0 0 0 0    No results found for this or any previous visit (from the past 2160 hour(s)).  Assessment & Plan:    1. Medicare annual wellness visit, subsequent  Discussed importance of 150 minutes of physical activity weekly, eat two servings of fish weekly, eat one serving of tree nuts ( cashews, pistachios, pecans, almonds.Marland Kitchen) every other day, eat 6 servings of fruit/vegetables daily and drink plenty of water and avoid sweet beverages.   2. Essential hypertension  - COMPLETE METABOLIC PANEL WITH GFR - TSH  3. Vitamin D deficiency  - VITAMIN D 25 Hydroxy (Vit-D Deficiency, Fractures)  4. Osteopenia of left hip  Bone density is up to date  5. History of hyperparathyroidism  - Parathyroid hormone, intact (no Ca)  6. RLS (restless legs syndrome)  - CBC with Differential/Platelet  7. Leg cramp  - CBC with Differential/Platelet - TSH - Magnesium  8. Encounter for screening mammogram for breast cancer  Mammogram   9. Cough  Needs to see Dr. [place] Callas sooner  - albuterol (VENTOLIN HFA) 108 (90 Base) MCG/ACT inhaler; Inhale 2 puffs into the lungs every 6 (six) hours as needed for wheezing or shortness of breath.  Dispense: 1 Inhaler; Refill: 0  10. Hypertriglyceridemia  - Lipid panel  Exercise Activities and Dietary recommendations  6 servings of fruit and vegetables daily Continue physical activity, but increase to 150 minutes a week   - Discussed health benefits of physical activity, and encouraged her to engage in regular exercise appropriate for her age and condition.   Immunization History  Administered Date(s) Administered  . Influenza, High Dose  Seasonal PF 06/05/2015, 06/18/2016, 06/17/2017  . Influenza-Unspecified 06/05/2015  . Pneumococcal Conjugate-13 12/09/2013  . Pneumococcal Polysaccharide-23 04/13/2012  . Tdap 04/13/2012  . Zoster 07/24/2013    Health Maintenance  Topic Date Due  . MAMMOGRAM  01/16/2019  . TETANUS/TDAP  04/13/2022  . COLONOSCOPY  06/12/2027  . INFLUENZA VACCINE  Completed  . DEXA SCAN  Completed  . Hepatitis C Screening  Completed  . PNA vac Low Risk Adult  Completed    Meds ordered this encounter  Medications  . albuterol (VENTOLIN HFA) 108 (90 Base) MCG/ACT inhaler    Sig: Inhale 2 puffs into the lungs every 6 (six) hours as needed for wheezing or shortness of breath.    Dispense:  1 Inhaler    Refill:  0    Current Outpatient Medications:  .  acetaminophen (TYLENOL) 500 MG tablet, Take 1-2 tablets (500-1,000 mg total) by mouth every 8 (eight) hours as needed., Disp: 100 tablet, Rfl: 2 .  amLODipine (NORVASC) 2.5 MG tablet, Take 1 tablet (2.5 mg total) by mouth daily., Disp: 90 tablet, Rfl: 1 .  Ascorbic Acid (VITAMIN C) 1000 MG tablet, Take by mouth., Disp: ,  Rfl:  .  Biotin 1000 MCG CHEW, Chew by mouth., Disp: , Rfl:  .  Calcium-Vitamin D 600-200 MG-UNIT tablet, Take by mouth., Disp: , Rfl:  .  Cholecalciferol (VITAMIN D3) 1000 units CAPS, Take by mouth., Disp: , Rfl:  .  L-Lysine 500 MG TABS, Take by mouth., Disp: , Rfl:  .  levocetirizine (XYZAL) 5 MG tablet, Take 1 tablet (5 mg total) by mouth every evening., Disp: 30 tablet, Rfl: 0 .  magnesium oxide (MAG-OX) 400 MG tablet, Take by mouth., Disp: , Rfl:  .  meloxicam (MOBIC) 7.5 MG tablet, Take 1 tablet (7.5 mg total) by mouth daily., Disp: 90 tablet, Rfl: 1 .  mometasone (NASONEX) 50 MCG/ACT nasal spray, Place 2 sprays into the nose daily., Disp: 17 g, Rfl: 12 .  montelukast (SINGULAIR) 10 MG tablet, Take 1 tablet (10 mg total) by mouth at bedtime., Disp: 90 tablet, Rfl: 1 .  Nutritional Supplements (JOINT FORMULA PO), Take 2 tablets  by mouth daily. , Disp: , Rfl:  .  olmesartan-hydrochlorothiazide (BENICAR HCT) 20-12.5 MG tablet, Take 1 tablet by mouth daily., Disp: 90 tablet, Rfl: 1 .  omeprazole (PRILOSEC) 40 MG capsule, Take 1 capsule (40 mg total) by mouth daily., Disp: 90 capsule, Rfl: 1 .  Potassium Gluconate ([employer] POTASSIUM GLUCONATE) 2 MEQ TABS, Take by mouth., Disp: , Rfl:  .  albuterol (VENTOLIN HFA) 108 (90 Base) MCG/ACT inhaler, Inhale 2 puffs into the lungs every 6 (six) hours as needed for wheezing or shortness of breath., Disp: 1 Inhaler, Rfl: 0 Medications Discontinued During This Encounter  Medication Reason  . benzonatate (TESSALON) 100 MG capsule Completed Course    I have personally reviewed and addressed the Medicare Annual Wellness health risk assessment questionnaire and have noted the following in the patient's chart:  A.         Medical and social history & family history B.         Use of alcohol, tobacco, and illicit drugs  C.         Current medications and supplements D.         Functional and Cognitive ability and status E.         Nutritional status F.         Physical activity G.        Advance directives H.         List of other physicians I.          Hospitalizations, surgeries, and ER visits in previous 12 months J.         Vitals K.         Screenings such as hearing, vision, cognitive function, and depression L.         Referrals and appointments: needs to follow up with [facility] sooner  In addition, I have reviewed and discussed with patient certain preventive protocols, quality metrics, and best practice recommendations. A written personalized care plan for preventive services as well as general preventive health recommendations were provided to patient.  See attached scanned questionnaire for additional information.

## 2017-11-06 LAB — CBC WITH DIFFERENTIAL/PLATELET
Basophils Absolute: 32 cells/uL (ref 0–200)
Basophils Relative: 0.4 %
Eosinophils Absolute: 119 cells/uL (ref 15–500)
Eosinophils Relative: 1.5 %
HCT: 41.3 % (ref 35.0–45.0)
Hemoglobin: 14.5 g/dL (ref 11.7–15.5)
Lymphs Abs: 2291 cells/uL (ref 850–3900)
MCH: 31.6 pg (ref 27.0–33.0)
MCHC: 35.1 g/dL (ref 32.0–36.0)
MCV: 90 fL (ref 80.0–100.0)
MPV: 9.4 fL (ref 7.5–12.5)
Monocytes Relative: 7.1 %
Neutro Abs: 4898 cells/uL (ref 1500–7800)
Neutrophils Relative %: 62 %
Platelets: 303 10*3/uL (ref 140–400)
RBC: 4.59 10*6/uL (ref 3.80–5.10)
RDW: 11.8 % (ref 11.0–15.0)
Total Lymphocyte: 29 %
WBC mixed population: 561 cells/uL (ref 200–950)
WBC: 7.9 10*3/uL (ref 3.8–10.8)

## 2017-11-06 LAB — LIPID PANEL
Cholesterol: 180 mg/dL (ref <200)
HDL: 50 mg/dL — ABNORMAL LOW (ref >50)
LDL Cholesterol (Calc): 100 mg/dL (calc) — ABNORMAL HIGH
Non-HDL Cholesterol (Calc): 130 mg/dL (calc) — ABNORMAL HIGH (ref <130)
Total CHOL/HDL Ratio: 3.6 (calc) (ref <5.0)
Triglycerides: 182 mg/dL — ABNORMAL HIGH (ref <150)

## 2017-11-06 LAB — COMPLETE METABOLIC PANEL WITH GFR
AG Ratio: 1.5 (calc) (ref 1.0–2.5)
ALT: 20 U/L (ref 6–29)
AST: 21 U/L (ref 10–35)
Albumin: 4.8 g/dL (ref 3.6–5.1)
Alkaline phosphatase (APISO): 48 U/L (ref 33–130)
BUN: 11 mg/dL (ref 7–25)
CO2: 29 mmol/L (ref 20–32)
Calcium: 10.4 mg/dL (ref 8.6–10.4)
Chloride: 101 mmol/L (ref 98–110)
Creat: 0.81 mg/dL (ref 0.60–0.93)
GFR, Est African American: 85 mL/min/{1.73_m2} (ref >=60)
GFR, Est Non African American: 74 mL/min/{1.73_m2} (ref >=60)
Globulin: 3.2 g/dL (calc) (ref 1.9–3.7)
Glucose, Bld: 83 mg/dL (ref 65–99)
Potassium: 3.7 mmol/L (ref 3.5–5.3)
Sodium: 140 mmol/L (ref 135–146)
Total Bilirubin: 0.4 mg/dL (ref 0.2–1.2)
Total Protein: 8 g/dL (ref 6.1–8.1)

## 2017-11-06 LAB — PARATHYROID HORMONE, INTACT (NO CA): PTH: 33 pg/mL (ref 14–64)

## 2017-11-06 LAB — TSH: TSH: 0.94 mIU/L (ref 0.40–4.50)

## 2017-11-06 LAB — MAGNESIUM: Magnesium: 2 mg/dL (ref 1.5–2.5)

## 2017-11-06 LAB — VITAMIN D 25 HYDROXY (VIT D DEFICIENCY, FRACTURES): Vit D, 25-Hydroxy: 87 ng/mL (ref 30–100)

## 2017-11-08 ENCOUNTER — Encounter: Payer: Self-pay | Admitting: Family Medicine

## 2017-11-08 DIAGNOSIS — E785 Hyperlipidemia, unspecified: Secondary | ICD-10-CM | POA: Insufficient documentation

## 2017-11-18 DIAGNOSIS — K219 Gastro-esophageal reflux disease without esophagitis: Secondary | ICD-10-CM | POA: Diagnosis not present

## 2017-11-18 DIAGNOSIS — J300 Vasomotor rhinitis: Secondary | ICD-10-CM | POA: Diagnosis not present

## 2017-11-18 DIAGNOSIS — R05 Cough: Secondary | ICD-10-CM | POA: Diagnosis not present

## 2017-12-29 ENCOUNTER — Telehealth: Payer: Self-pay | Admitting: Family Medicine

## 2017-12-29 NOTE — Telephone Encounter (Signed)
Since [facility] is out of her BP Medication, we were able to call around and find it at [facility]. Prescription was called in which was approved by Dr. Carlynn Purl and patient was notified.

## 2017-12-29 NOTE — Telephone Encounter (Signed)
Copied from CRM [id number]. Topic: Quick Communication - See Telephone Encounter >> Dec 29, 2017 11:39 AM Diana Eves B wrote: CRM for notification. See Telephone encounter for: 12/29/17. Pharmacy is out of olmesartan-hydrochlorothiazide (BENICAR HCT) 20-12.5 MG tablet. And they are stating it may not become available. That she could have it sent to another pharmacy that may have it or have something different called in. Pt does have enough for 4 more days.

## 2018-02-09 DIAGNOSIS — Z820 Family history of epilepsy and other diseases of the nervous system: Secondary | ICD-10-CM | POA: Diagnosis not present

## 2018-02-09 DIAGNOSIS — J45909 Unspecified asthma, uncomplicated: Secondary | ICD-10-CM | POA: Diagnosis not present

## 2018-02-09 DIAGNOSIS — Z7951 Long term (current) use of inhaled steroids: Secondary | ICD-10-CM | POA: Diagnosis not present

## 2018-02-09 DIAGNOSIS — Z823 Family history of stroke: Secondary | ICD-10-CM | POA: Diagnosis not present

## 2018-02-09 DIAGNOSIS — Z809 Family history of malignant neoplasm, unspecified: Secondary | ICD-10-CM | POA: Diagnosis not present

## 2018-02-09 DIAGNOSIS — G8929 Other chronic pain: Secondary | ICD-10-CM | POA: Diagnosis not present

## 2018-02-09 DIAGNOSIS — M792 Neuralgia and neuritis, unspecified: Secondary | ICD-10-CM | POA: Diagnosis not present

## 2018-02-09 DIAGNOSIS — I10 Essential (primary) hypertension: Secondary | ICD-10-CM | POA: Diagnosis not present

## 2018-02-09 DIAGNOSIS — K219 Gastro-esophageal reflux disease without esophagitis: Secondary | ICD-10-CM | POA: Diagnosis not present

## 2018-02-09 DIAGNOSIS — Z803 Family history of malignant neoplasm of breast: Secondary | ICD-10-CM | POA: Diagnosis not present

## 2018-03-05 ENCOUNTER — Encounter: Payer: Self-pay | Admitting: Family Medicine

## 2018-03-05 ENCOUNTER — Ambulatory Visit (INDEPENDENT_AMBULATORY_CARE_PROVIDER_SITE_OTHER): Payer: Medicare HMO | Admitting: Family Medicine

## 2018-03-05 VITALS — BP 130/70 | HR 78 | Resp 16 | Ht 60.0 in | Wt 147.9 lb

## 2018-03-05 DIAGNOSIS — I10 Essential (primary) hypertension: Secondary | ICD-10-CM

## 2018-03-05 DIAGNOSIS — M5136 Other intervertebral disc degeneration, lumbar region: Secondary | ICD-10-CM | POA: Diagnosis not present

## 2018-03-05 DIAGNOSIS — J45998 Other asthma: Secondary | ICD-10-CM | POA: Diagnosis not present

## 2018-03-05 DIAGNOSIS — G2581 Restless legs syndrome: Secondary | ICD-10-CM

## 2018-03-05 DIAGNOSIS — E559 Vitamin D deficiency, unspecified: Secondary | ICD-10-CM

## 2018-03-05 DIAGNOSIS — K219 Gastro-esophageal reflux disease without esophagitis: Secondary | ICD-10-CM | POA: Diagnosis not present

## 2018-03-05 DIAGNOSIS — R252 Cramp and spasm: Secondary | ICD-10-CM | POA: Diagnosis not present

## 2018-03-05 DIAGNOSIS — E781 Pure hyperglyceridemia: Secondary | ICD-10-CM | POA: Diagnosis not present

## 2018-03-05 DIAGNOSIS — R202 Paresthesia of skin: Secondary | ICD-10-CM | POA: Diagnosis not present

## 2018-03-05 MED ORDER — OMEPRAZOLE 40 MG PO CPDR: 40.0000 mg | DELAYED_RELEASE_CAPSULE | Freq: Every day | ORAL | 1 refills | Status: DC

## 2018-03-05 MED ORDER — OLMESARTAN MEDOXOMIL-HCTZ 20-12.5 MG PO TABS: 1.0000 | ORAL_TABLET | Freq: Every day | ORAL | 1 refills | Status: DC

## 2018-03-05 MED ORDER — ROPINIROLE HCL 0.5 MG PO TABS: 0.5000 mg | ORAL_TABLET | Freq: Every day | ORAL | 0 refills | Status: DC

## 2018-03-05 NOTE — Progress Notes (Signed)
Name: SEMIAH SISTARE   MRN: [id number]    DOB: 1945-12-03   Date:03/05/2018       Progress Note  Subjective  Chief Complaint  Chief Complaint  Patient presents with  . Hypertension  . Gastroesophageal Reflux  . Hypothyroidism  . Joint Pain    she continues to have cramps in her hands and feet x 2 years but it is graudally worsening.  . Cough    maybe from allergies x 2 months.    HPI   HTN: patient is compliant with medication, bp is high today, but it is usually under control, no chest pain or palpitation. She has a history of BPPV but no recent episodes recently.   Chronic allergic bronchitis: she was doing well on Breo and Ventolin, however she is the process of moving and hads been dusting and moving stuff around and has noticed increase in cough, it is productive and clear in color . She was seen by Immunologist , she is on Xyzal, singulair and Breo- wheezing is under control with Breo. She states SOB controlled with Breo  Vitamin D deficiency:history of hyperparathyroidism, and at one point she was osteoporotic, but last one just showed osteopenia, only on calcium and vitamin D at this time . She has taken fosamax in the past. Checked Pth back in 10/2017 and was normal.  GERD: she has tried weaning self off Omeprazole but it causes reflux symptom, also uncontrolled reflux can make cough worse. She is off Meloxicam  DDD: she still has some back pain, also shooting sensation on mid back or legs and cramping on legs has increased. She states she has NCV in the past, we will refer her back to neurologist for the shock sensation and severe cramping.   Chronic left knee pain/OA: she states that she takes Tylenol prn for pain, usually throbbing, happens daily and most of the time improves with Tylenol , however when more active she states immobilizing helps with the pain. No effusion, redness or increase in warmth noticed. Unchanged  RLS/ leg cramps: getting worse takes  magnesium, and potassium otc still has cramps, symptoms are worse at night, labs done in Feb and normal. Refer to neurologist   Insulin resistance: high triglycerides and elevated fasting insulin, with low HDL. Discussed life style modification for now.    Patient Active Problem List   Diagnosis Date Noted  . Dyslipidemia 11/08/2017  . Vitamin D deficiency 10/31/2016  . Osteopenia of left hip 10/31/2016  . 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  . BPV (benign positional vertigo) 04/17/2016  . RLS (restless legs syndrome) 04/17/2016  . GERD (gastroesophageal reflux disease) 03/11/2016  . History of hyperparathyroidism 03/11/2016  . History of gastric ulcer 03/11/2016  . Essential hypertension 04/24/2015  . Primary osteoarthritis involving multiple joints 04/24/2015  . Lumbago 04/24/2015  . Family history of colonic polyps     Past Surgical History:  Procedure Laterality Date  . CARPAL TUNNEL RELEASE Bilateral 2000  . COLONOSCOPY  06/17/2012   A few two mm ulcers were found in the sigmoid colon, no bleeding present,bx taken-path reporet on the few tiny ulcers ,non specific  . COLONOSCOPY WITH PROPOFOL N/A 06/11/2017   Procedure: COLONOSCOPY WITH PROPOFOL;  Surgeon: Kieth Brightly, MD;  Location: [facility] ENDOSCOPY;  Service: Endoscopy;  Laterality: N/A;  . parathyroid surgery  2008  . 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 TUBAL LIGATION      Family History  Problem Relation Age of Onset  .  Kidney disease Mother   . Ovarian cancer Mother   . Hypertension Father   . Stroke Father   . Lupus Sister     Social History   Socioeconomic History  . Marital status: Married    Spouse name: Serenah Mill   . Number of children: 1  . Years of education: Not on file  . Highest education level: Some college, no degree  Occupational History  . Not on file  Social Needs  . Financial resource strain: Not hard at all  . Food insecurity:     Worry: Never true    Inability: Never true  . Transportation needs:    Medical: No    Non-medical: No  Tobacco Use  . Smoking status: Never Smoker  . Smokeless tobacco: Never Used  Substance and Sexual Activity  . Alcohol use: Yes    Alcohol/week: 0.0 oz    Comment: wine; 3-4 times per year  . Drug use: No  . Sexual activity: Yes    Partners: Male    Birth control/protection: Post-menopausal  Lifestyle  . Physical activity:    Days per week: 3 days    Minutes per session: 20 min  . Stress: Not at all  Relationships  . Social connections:    Talks on phone: More than three times a week    Gets together: More than three times a week    Attends religious service: More than 4 times per year    Active member of club or organization: Yes    Attends meetings of clubs or organizations: More than 4 times per year    Relationship status: Married  . Intimate partner violence:    Fear of current or ex partner: No    Emotionally abused: No    Physically abused: No    Forced sexual activity: No  Other Topics Concern  . Not on file  Social History Narrative   Married, retired, involved in church and has one grown son      Current Outpatient Medications:  .  acetaminophen (TYLENOL) 500 MG tablet, Take 1-2 tablets (500-1,000 mg total) by mouth every 8 (eight) hours as needed., Disp: 100 tablet, Rfl: 2 .  albuterol (VENTOLIN HFA) 108 (90 Base) MCG/ACT inhaler, Inhale 2 puffs into the lungs every 6 (six) hours as needed for wheezing or shortness of breath., Disp: 1 Inhaler, Rfl: 0 .  amLODipine (NORVASC) 2.5 MG tablet, Take 1 tablet (2.5 mg total) by mouth daily., Disp: 90 tablet, Rfl: 1 .  Ascorbic Acid (VITAMIN C) 1000 MG tablet, Take by mouth., Disp: , Rfl:  .  Biotin 1000 MCG CHEW, Chew by mouth., Disp: , Rfl:  .  BREO ELLIPTA 200-25 MCG/INH AEPB, Take 1 puff by mouth daily., Disp: , Rfl:  .  Calcium-Vitamin D 600-200 MG-UNIT tablet, Take by mouth., Disp: , Rfl:  .  Cholecalciferol  (VITAMIN D3) 1000 units CAPS, Take by mouth., Disp: , Rfl:  .  L-Lysine 500 MG TABS, Take by mouth., Disp: , Rfl:  .  levocetirizine (XYZAL) 5 MG tablet, Take 1 tablet (5 mg total) by mouth every evening., Disp: 30 tablet, Rfl: 0 .  magnesium oxide (MAG-OX) 400 MG tablet, Take by mouth., Disp: , Rfl:  .  mometasone (NASONEX) 50 MCG/ACT nasal spray, Place 2 sprays into the nose daily., Disp: 17 g, Rfl: 12 .  montelukast (SINGULAIR) 10 MG tablet, Take 1 tablet (10 mg total) by mouth at bedtime., Disp: 90 tablet, Rfl: 1 .  Nutritional Supplements (JOINT FORMULA PO), Take 2 tablets by mouth daily. , Disp: , Rfl:  .  olmesartan-hydrochlorothiazide (BENICAR HCT) 20-12.5 MG tablet, Take 1 tablet by mouth daily., Disp: 90 tablet, Rfl: 1 .  omeprazole (PRILOSEC) 40 MG capsule, Take 1 capsule (40 mg total) by mouth daily., Disp: 90 capsule, Rfl: 1 .  Potassium Gluconate ([employer] POTASSIUM GLUCONATE) 2 MEQ TABS, Take by mouth., Disp: , Rfl:   Allergies  Allergen Reactions  . Ace Inhibitors Cough  . Aspirin Nausea Only     ROS  Constitutional: Negative for fever or weight change.  Respiratory: positive  for cough but negative  shortness of breath.   Cardiovascular: Negative for chest pain or palpitations.  Gastrointestinal: Negative for abdominal pain, no bowel changes.  Musculoskeletal: Negative for gait problem or joint swelling.  Skin: Negative for rash.  Neurological: Negative for dizziness or headache.  No other specific complaints in a complete review of systems (except as listed in HPI above).  Objective  Vitals:   03/05/18 0843  BP: (!) 160/50  Pulse: 78  Resp: 16  SpO2: 99%  Weight: 147 lb 14.4 oz (67.1 kg)  Height: 5' (1.524 m)    Body mass index is 28.88 kg/m.  Physical Exam  Constitutional: Patient appears well-developed and well-nourished. Overweight.  No distress.  HEENT: head atraumatic, normocephalic, pupils equal and reactive to light, neck supple, throat within normal  limits Cardiovascular: Normal rate, regular rhythm and normal heart sounds.  No murmur heard. No BLE edema. Pulmonary/Chest: Effort normal and breath sounds normal. No respiratory distress. Abdominal: Soft.  There is no tenderness. Psychiatric: Patient has a normal mood and affect. behavior is normal. Judgment and thought content normal.  PHQ2/9: Depression screen [facility] 2/9 11/05/2017 06/18/2017 03/05/2017 10/31/2016 06/18/2016  Decreased Interest 0 0 0 0 0  Down, Depressed, Hopeless 0 0 0 0 0  PHQ - 2 Score 0 0 0 0 0    Fall Risk: Fall Risk  03/05/2018 11/05/2017 09/18/2017 06/18/2017 03/05/2017  Falls in the past year? No No No No No  Number falls in past yr: - - - - -  Injury with Fall? - - - - -     Functional Status Survey: Is the patient deaf or have difficulty hearing?: No Does the patient have difficulty seeing, even when wearing glasses/contacts?: No Does the patient have difficulty concentrating, remembering, or making decisions?: No Does the patient have difficulty walking or climbing stairs?: No Does the patient have difficulty dressing or bathing?: No Does the patient have difficulty doing errands alone such as visiting a doctor's office or shopping?: No    Assessment & Plan   1. Essential hypertension  - olmesartan-hydrochlorothiazide (BENICAR HCT) 20-12.5 MG tablet; Take 1 tablet by mouth daily.  Dispense: 90 tablet; Refill: 1  2. Gastroesophageal reflux disease without esophagitis  - omeprazole (PRILOSEC) 40 MG capsule; Take 1 capsule (40 mg total) by mouth daily.  Dispense: 90 capsule; Refill: 1  3. RLS (restless legs syndrome)  Not on medication at this time - Requip 0.5 -1 mg qhs #60 and no refills.   4. Chronic allergic bronchitis  Continue medication   5. Hypertriglyceridemia  Discussed life style modification   6. DDD (degenerative disc disease), lumbar   7. Vitamin D deficiency  Continue supplementation   8. Leg cramp   9. Paresthesia  -  Ambulatory referral to Neurology

## 2018-05-10 ENCOUNTER — Other Ambulatory Visit: Payer: Self-pay | Admitting: Family Medicine

## 2018-05-10 DIAGNOSIS — G2581 Restless legs syndrome: Secondary | ICD-10-CM

## 2018-05-11 DIAGNOSIS — E559 Vitamin D deficiency, unspecified: Secondary | ICD-10-CM | POA: Diagnosis not present

## 2018-05-11 DIAGNOSIS — G629 Polyneuropathy, unspecified: Secondary | ICD-10-CM | POA: Diagnosis not present

## 2018-05-11 DIAGNOSIS — R202 Paresthesia of skin: Secondary | ICD-10-CM | POA: Diagnosis not present

## 2018-05-11 DIAGNOSIS — E538 Deficiency of other specified B group vitamins: Secondary | ICD-10-CM | POA: Diagnosis not present

## 2018-05-11 DIAGNOSIS — R200 Anesthesia of skin: Secondary | ICD-10-CM | POA: Diagnosis not present

## 2018-05-11 DIAGNOSIS — G2581 Restless legs syndrome: Secondary | ICD-10-CM | POA: Diagnosis not present

## 2018-05-21 DIAGNOSIS — K219 Gastro-esophageal reflux disease without esophagitis: Secondary | ICD-10-CM | POA: Diagnosis not present

## 2018-05-21 DIAGNOSIS — R05 Cough: Secondary | ICD-10-CM | POA: Diagnosis not present

## 2018-05-21 DIAGNOSIS — J300 Vasomotor rhinitis: Secondary | ICD-10-CM | POA: Diagnosis not present

## 2018-05-27 DIAGNOSIS — R200 Anesthesia of skin: Secondary | ICD-10-CM | POA: Diagnosis not present

## 2018-05-27 DIAGNOSIS — R202 Paresthesia of skin: Secondary | ICD-10-CM | POA: Diagnosis not present

## 2018-06-01 DIAGNOSIS — R202 Paresthesia of skin: Secondary | ICD-10-CM | POA: Diagnosis not present

## 2018-06-01 DIAGNOSIS — R200 Anesthesia of skin: Secondary | ICD-10-CM | POA: Diagnosis not present

## 2018-06-08 ENCOUNTER — Ambulatory Visit: Payer: Medicare HMO | Admitting: Family Medicine

## 2018-06-09 ENCOUNTER — Ambulatory Visit (INDEPENDENT_AMBULATORY_CARE_PROVIDER_SITE_OTHER): Payer: Medicare HMO | Admitting: Family Medicine

## 2018-06-09 ENCOUNTER — Encounter: Payer: Self-pay | Admitting: Family Medicine

## 2018-06-09 VITALS — BP 122/72 | HR 75 | Temp 98.2°F | Resp 16 | Ht 60.0 in | Wt 144.9 lb

## 2018-06-09 DIAGNOSIS — G629 Polyneuropathy, unspecified: Secondary | ICD-10-CM | POA: Diagnosis not present

## 2018-06-09 DIAGNOSIS — R252 Cramp and spasm: Secondary | ICD-10-CM

## 2018-06-09 DIAGNOSIS — G2581 Restless legs syndrome: Secondary | ICD-10-CM | POA: Diagnosis not present

## 2018-06-09 DIAGNOSIS — E559 Vitamin D deficiency, unspecified: Secondary | ICD-10-CM | POA: Diagnosis not present

## 2018-06-09 DIAGNOSIS — K219 Gastro-esophageal reflux disease without esophagitis: Secondary | ICD-10-CM | POA: Diagnosis not present

## 2018-06-09 DIAGNOSIS — Z23 Encounter for immunization: Secondary | ICD-10-CM

## 2018-06-09 DIAGNOSIS — E781 Pure hyperglyceridemia: Secondary | ICD-10-CM | POA: Diagnosis not present

## 2018-06-09 DIAGNOSIS — I10 Essential (primary) hypertension: Secondary | ICD-10-CM

## 2018-06-09 DIAGNOSIS — E538 Deficiency of other specified B group vitamins: Secondary | ICD-10-CM

## 2018-06-09 DIAGNOSIS — M85852 Other specified disorders of bone density and structure, left thigh: Secondary | ICD-10-CM | POA: Diagnosis not present

## 2018-06-09 DIAGNOSIS — M5136 Other intervertebral disc degeneration, lumbar region: Secondary | ICD-10-CM

## 2018-06-09 DIAGNOSIS — M51369 Other intervertebral disc degeneration, lumbar region without mention of lumbar back pain or lower extremity pain: Secondary | ICD-10-CM

## 2018-06-09 DIAGNOSIS — J45909 Unspecified asthma, uncomplicated: Secondary | ICD-10-CM

## 2018-06-09 MED ORDER — AMLODIPINE BESYLATE 2.5 MG PO TABS: 2.5000 mg | ORAL_TABLET | Freq: Every day | ORAL | 1 refills | Status: DC

## 2018-06-09 MED ORDER — OLMESARTAN MEDOXOMIL-HCTZ 20-12.5 MG PO TABS: 1.0000 | ORAL_TABLET | Freq: Every day | ORAL | 1 refills | Status: DC

## 2018-06-09 MED ORDER — ROPINIROLE HCL 0.5 MG PO TABS: 0.5000 mg | ORAL_TABLET | Freq: Every day | ORAL | 1 refills | Status: DC

## 2018-06-09 MED ORDER — CYANOCOBALAMIN 1000 MCG/ML IJ SOLN
1000.0000 ug | Freq: Once | INTRAMUSCULAR | Status: AC
  Administered 2018-06-09: 1000 ug via INTRAMUSCULAR

## 2018-06-09 MED ORDER — OMEPRAZOLE 40 MG PO CPDR: 40.0000 mg | DELAYED_RELEASE_CAPSULE | Freq: Every day | ORAL | 1 refills | Status: DC

## 2018-06-09 NOTE — Progress Notes (Signed)
Name: Ruth [name]   MRN: [id number]    DOB: July 16, 1946   Date:06/09/2018       Progress Note  Subjective  Chief Complaint  Chief Complaint  Patient presents with  . Medication Refill  . Hypertension    Denies any symptoms  . RLS    Had a bad morning with cramps causing her to sweat   . Gastroesophageal Reflux  . Insulin Resistance    HPI  HTN: patient is compliant with medication, bp is at goal today,   no chest pain or palpitation.   Chronic allergic bronchitis: she was doing well on Breo and Ventolin, however she is the process of moving and hads been dusting and moving stuff around and has noticed increase in cough, it is productive and clear in color . She was seen by Immunologist , she is on Xyzal, singulair and Breo- wheezing is under control with Breo. She states SOB controlled with Breo  Vitamin D deficiency:history of hyperparathyroidism, and at one point she was osteoporotic, but last one just showed osteopenia, only on calcium and vitamin D at this time. She has taken fosamax in the past. Checked Pth back in 10/2017 and was normal. Unchanged. She is trying to be active  GERD: she has tried weaning self off Omeprazole but it causes reflux symptom, also uncontrolled reflux can make cough worse. She has been eating out more often and had a recent episode, but usually controlled   DDD: she still has some back pain, also shooting sensation on mid back or legs and cramping on legs has increased. She states she has NCV in the past, seen by Dr. Sherryll Burger and diagnosed with B12 deficiency and peripheral neuropathy, still having burning sensation on both legs, left worse on left side, also having on hands now. She is taking B12, but not sublingual.   Chronic left knee pain/OA: she states that she takes Tylenol prn for pain, usually throbbing, happens daily and most of the time improves with Tylenol , however when more active she states immobilizing helps with the pain. No effusion,  redness or increase in warmth  RLS/ leg cramps: getting worse takes magnesium, and potassium otc still has cramps, symptoms are worse at night, seeing Dr. Sherryll Burger now   Insulin resistance: high triglycerides and elevated fasting insulin, with low HDL. She has been eating out but trying to eat healthy, waiting for her house to be done.    Patient Active Problem List   Diagnosis Date Noted  . Dyslipidemia 11/08/2017  . Vitamin D deficiency 10/31/2016  . Osteopenia of left hip 10/31/2016  . 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  . BPV (benign positional vertigo) 04/17/2016  . RLS (restless legs syndrome) 04/17/2016  . GERD (gastroesophageal reflux disease) 03/11/2016  . History of hyperparathyroidism 03/11/2016  . History of gastric ulcer 03/11/2016  . Essential hypertension 04/24/2015  . Primary osteoarthritis involving multiple joints 04/24/2015  . Lumbago 04/24/2015  . Family history of colonic polyps     Past Surgical History:  Procedure Laterality Date  . CARPAL TUNNEL RELEASE Bilateral 2000  . COLONOSCOPY  06/17/2012   A few two mm ulcers were found in the sigmoid colon, no bleeding present,bx taken-path reporet on the few tiny ulcers ,non specific  . COLONOSCOPY WITH PROPOFOL N/A 06/11/2017   Procedure: COLONOSCOPY WITH PROPOFOL;  Surgeon: Kieth Brightly, MD;  Location: [facility] ENDOSCOPY;  Service: Endoscopy;  Laterality: N/A;  . parathyroid surgery  2008  . TEMPOROMANDIBULAR JOINT ARTHROPLASTY Left 1984  1989,1990-bilateral  . TUBAL LIGATION      Family History  Problem Relation Age of Onset  . Kidney disease Mother   . Ovarian cancer Mother   . Hypertension Father   . Stroke Father   . Lupus Sister     Social History   Socioeconomic History  . Marital status: Married    Spouse name: Rishika Oro   . Number of children: 1  . Years of education: Not on file  . Highest education level: Some college, no degree  Occupational History  . Not on file   Social Needs  . Financial resource strain: Not hard at all  . Food insecurity:    Worry: Never true    Inability: Never true  . Transportation needs:    Medical: No    Non-medical: No  Tobacco Use  . Smoking status: Never Smoker  . Smokeless tobacco: Never Used  Substance and Sexual Activity  . Alcohol use: Yes    Comment: wine; 3-4 times per year  . Drug use: No  . Sexual activity: Yes    Partners: Male    Birth control/protection: Post-menopausal  Lifestyle  . Physical activity:    Days per week: 3 days    Minutes per session: 20 min  . Stress: Not at all  Relationships  . Social connections:    Talks on phone: More than three times a week    Gets together: More than three times a week    Attends religious service: More than 4 times per year    Active member of club or organization: Yes    Attends meetings of clubs or organizations: More than 4 times per year    Relationship status: Married  . Intimate partner violence:    Fear of current or ex partner: No    Emotionally abused: No    Physically abused: No    Forced sexual activity: No  Other Topics Concern  . Not on file  Social History Narrative   Married, retired, involved in church and has one grown son      Current Outpatient Medications:  .  acetaminophen (TYLENOL) 500 MG tablet, Take 1-2 tablets (500-1,000 mg total) by mouth every 8 (eight) hours as needed., Disp: 100 tablet, Rfl: 2 .  albuterol (VENTOLIN HFA) 108 (90 Base) MCG/ACT inhaler, Inhale 2 puffs into the lungs every 6 (six) hours as needed for wheezing or shortness of breath., Disp: 1 Inhaler, Rfl: 0 .  amLODipine (NORVASC) 2.5 MG tablet, Take 1 tablet (2.5 mg total) by mouth daily., Disp: 90 tablet, Rfl: 1 .  Ascorbic Acid (VITAMIN C) 1000 MG tablet, Take by mouth., Disp: , Rfl:  .  BREO ELLIPTA 200-25 MCG/INH AEPB, Take 1 puff by mouth daily., Disp: , Rfl:  .  Calcium-Vitamin D 600-200 MG-UNIT tablet, Take by mouth., Disp: , Rfl:  .   Cholecalciferol (VITAMIN D3) 1000 units CAPS, Take by mouth., Disp: , Rfl:  .  Cyanocobalamin (B-12) 2000 MCG TABS, Take 1 tablet by mouth daily., Disp: , Rfl:  .  L-Lysine 500 MG TABS, Take by mouth., Disp: , Rfl:  .  levocetirizine (XYZAL) 5 MG tablet, Take 1 tablet (5 mg total) by mouth every evening., Disp: 30 tablet, Rfl: 0 .  magnesium oxide (MAG-OX) 400 MG tablet, Take by mouth., Disp: , Rfl:  .  mometasone (NASONEX) 50 MCG/ACT nasal spray, Place 2 sprays into the nose daily., Disp: 17 g, Rfl: 12 .  montelukast (SINGULAIR) 10  MG tablet, Take 1 tablet (10 mg total) by mouth at bedtime., Disp: 90 tablet, Rfl: 1 .  Nutritional Supplements (JOINT FORMULA PO), Take 2 tablets by mouth daily. , Disp: , Rfl:  .  olmesartan-hydrochlorothiazide (BENICAR HCT) 20-12.5 MG tablet, Take 1 tablet by mouth daily., Disp: 90 tablet, Rfl: 1 .  omeprazole (PRILOSEC) 40 MG capsule, Take 1 capsule (40 mg total) by mouth daily., Disp: 90 capsule, Rfl: 1 .  Potassium Gluconate ([employer]S POTASSIUM GLUCONATE) 2 MEQ TABS, Take by mouth., Disp: , Rfl:  .  rOPINIRole (REQUIP) 0.5 MG tablet, TAKE 1 TO 2 TABLETS BY MOUTH AT BEDTIME, Disp: 60 tablet, Rfl: 0 .  Biotin 1000 MCG CHEW, Chew by mouth., Disp: , Rfl:   Allergies  Allergen Reactions  . Ace Inhibitors Cough  . Aspirin Nausea Only    I personally reviewed active problem list, medication list, allergies, family history, social history with the patient/caregiver today.   ROS  Constitutional: Negative for fever or significant weight change.  Respiratory: Negative for cough and shortness of breath.   Cardiovascular: Negative for chest pain or palpitations.  Gastrointestinal: Negative for abdominal pain, no bowel changes.  Musculoskeletal: Negative for gait problem or joint swelling.  Skin: Negative for rash.  Neurological: Negative for dizziness or headache.  No other specific complaints in a complete review of systems (except as listed in HPI  above).  Objective  Vitals:   06/09/18 0836  BP: 122/72  Pulse: 75  Resp: 16  Temp: 98.2 F (36.8 C)  TempSrc: Oral  SpO2: 99%  Weight: 144 lb 14.4 oz (65.7 kg)  Height: 5' (1.524 m)    Body mass index is 28.3 kg/m.  Physical Exam  Constitutional: Patient appears well-developed and well-nourished. Overweight. No distress.  HEENT: head atraumatic, normocephalic, pupils equal and reactive to light, neck supple, throat within normal limits Cardiovascular: Normal rate, regular rhythm and normal heart sounds.  No murmur heard. No BLE edema. Pulmonary/Chest: Effort normal and breath sounds normal. No respiratory distress. Abdominal: Soft.  There is no tenderness. Psychiatric: Patient has a normal mood and affect. behavior is normal. Judgment and thought content normal.  PHQ2/9: Depression screen [facility] 2/9 06/09/2018 11/05/2017 06/18/2017 03/05/2017 10/31/2016  Decreased Interest 0 0 0 0 0  Down, Depressed, Hopeless 0 0 0 0 0  PHQ - 2 Score 0 0 0 0 0    Fall Risk: Fall Risk  06/09/2018 03/05/2018 11/05/2017 09/18/2017 06/18/2017  Falls in the past year? No No No No No  Number falls in past yr: - - - - -  Injury with Fall? - - - - -    Functional Status Survey: Is the patient deaf or have difficulty hearing?: No Does the patient have difficulty seeing, even when wearing glasses/contacts?: Yes Does the patient have difficulty concentrating, remembering, or making decisions?: No Does the patient have difficulty walking or climbing stairs?: No Does the patient have difficulty dressing or bathing?: No Does the patient have difficulty doing errands alone such as visiting a doctor's office or shopping?: No    Assessment & Plan  1. Essential hypertension  - amLODipine (NORVASC) 2.5 MG tablet; Take 1 tablet (2.5 mg total) by mouth daily.  Dispense: 90 tablet; Refill: 1 - olmesartan-hydrochlorothiazide (BENICAR HCT) 20-12.5 MG tablet; Take 1 tablet by mouth daily.  Dispense: 90 tablet;  Refill: 1  2. Need for immunization against influenza  - Flu vaccine HIGH DOSE PF (Fluzone High dose)  3. Gastroesophageal reflux disease without esophagitis  -  omeprazole (PRILOSEC) 40 MG capsule; Take 1 capsule (40 mg total) by mouth daily.  Dispense: 90 capsule; Refill: 1  4. RLS (restless legs syndrome)  - rOPINIRole (REQUIP) 0.5 MG tablet; Take 1 tablet (0.5 mg total) by mouth at bedtime.  Dispense: 90 tablet; Refill: 1  5. Leg cramp  Still having severe symptoms  6. Peripheral polyneuropathy  Seeing Dr. Sherryll Burger   7. B12 deficiency  - cyanocobalamin ((VITAMIN B-12)) injection 1,000 mcg  8. Hypertriglyceridemia  Recheck it yearly   9. Vitamin D deficiency  Continue otc vitamin D   10. Osteopenia of left hip  On calcium and vitamin D   11. DDD (degenerative disc disease), lumbar   12. Bronchitis, allergic, unspecified asthma severity, uncomplicated  Under the care of Dr. Hanapepe Callas

## 2018-07-15 ENCOUNTER — Ambulatory Visit (INDEPENDENT_AMBULATORY_CARE_PROVIDER_SITE_OTHER): Payer: Medicare HMO

## 2018-07-15 DIAGNOSIS — E538 Deficiency of other specified B group vitamins: Secondary | ICD-10-CM | POA: Diagnosis not present

## 2018-07-15 MED ORDER — CYANOCOBALAMIN 1000 MCG/ML IJ SOLN
1000.0000 ug | Freq: Once | INTRAMUSCULAR | Status: AC
  Administered 2018-07-15: 1000 ug via INTRAMUSCULAR

## 2018-08-10 ENCOUNTER — Ambulatory Visit (INDEPENDENT_AMBULATORY_CARE_PROVIDER_SITE_OTHER): Payer: Medicare HMO

## 2018-08-10 DIAGNOSIS — E538 Deficiency of other specified B group vitamins: Secondary | ICD-10-CM

## 2018-08-10 MED ORDER — CYANOCOBALAMIN 1000 MCG/ML IJ SOLN
1000.0000 ug | Freq: Once | INTRAMUSCULAR | Status: AC
  Administered 2018-08-10: 1000 ug via INTRAMUSCULAR

## 2018-09-08 ENCOUNTER — Ambulatory Visit (INDEPENDENT_AMBULATORY_CARE_PROVIDER_SITE_OTHER): Payer: Medicare HMO

## 2018-09-08 DIAGNOSIS — E538 Deficiency of other specified B group vitamins: Secondary | ICD-10-CM | POA: Diagnosis not present

## 2018-09-08 MED ORDER — CYANOCOBALAMIN 1000 MCG/ML IJ SOLN
1000.0000 ug | INTRAMUSCULAR | Status: AC
  Administered 2018-09-08 – 2019-02-10 (×5): 1000 ug via SUBCUTANEOUS

## 2018-09-08 NOTE — Progress Notes (Signed)
Patient came in for her B-12 injection. She tolerated it well. NKDA.   

## 2018-09-14 ENCOUNTER — Ambulatory Visit: Payer: Self-pay

## 2018-09-14 ENCOUNTER — Encounter: Payer: Self-pay | Admitting: Family Medicine

## 2018-09-14 ENCOUNTER — Ambulatory Visit (INDEPENDENT_AMBULATORY_CARE_PROVIDER_SITE_OTHER): Payer: Medicare HMO | Admitting: Family Medicine

## 2018-09-14 VITALS — BP 120/70 | HR 97 | Temp 98.6°F | Resp 16 | Ht 60.0 in | Wt 143.7 lb

## 2018-09-14 DIAGNOSIS — R590 Localized enlarged lymph nodes: Secondary | ICD-10-CM

## 2018-09-14 DIAGNOSIS — G629 Polyneuropathy, unspecified: Secondary | ICD-10-CM | POA: Diagnosis not present

## 2018-09-14 MED ORDER — PREGABALIN 50 MG PO CAPS: 50.0000 mg | ORAL_CAPSULE | Freq: Three times a day (TID) | ORAL | 0 refills | Status: DC

## 2018-09-14 NOTE — Addendum Note (Signed)
Addended by: Alba Cory F on: 09/14/2018 01:38 PM   Modules accepted: Orders

## 2018-09-14 NOTE — Addendum Note (Signed)
Addended by: Alba Cory F on: 09/14/2018 01:49 PM   Modules accepted: Orders

## 2018-09-14 NOTE — Addendum Note (Signed)
Addended by: Tommie Raymond on: 09/14/2018 01:49 PM   Modules accepted: Orders

## 2018-09-14 NOTE — Telephone Encounter (Signed)
Patient called in with c/o "neck swelling." She says "I didn't notice it, but a friend of mine who was a nurse said my neck was swollen on the left side and I should get it checked out because it may be a blood clot. This was last week on Wednesday when she told me. I've been so busy that I'm just now calling." I asked about pain to the neck, she says "no, I don't have pain to my neck." I asked her to look at the mirror straight ahead and describe how large the swelling is, she says "well, it looks like a quarter size, but I didn't notice until now. It's not painful to touch." I asked about symptoms of a cold, she says "no sore throat, but I am doctoring a cold right now, no fever that I'm aware of." I asked about other symptoms, she says "nothing new, just my usual arthritis pain, back pain, pain/cramping to my legs/feet/toes.". According to protocol, see PCP within 2 weeks, appointment scheduled for today at 1340 with Dr. Carlynn Purl, care advice given, patient verbalized understanding.  Reason for Disposition . [1] Normal-sized node (i.e., < 1 cm, <1/2 in) AND [2] present > 2 weeks AND [3] patient is worried about cancer  Answer Assessment - Initial Assessment Questions 1. LOCATION: "Where is the swollen node located?" "Is the matching node on the other side of the body also swollen?"      Left side neck 2. SIZE: "How big is the node?" (Inches or centimeters) (or compare to common objects such as pea, bean, marble, golf ball)      Quarter size 3. ONSET: "When did the swelling start?"      Last week it was brought to my attention, Wednesday. 4. NECK NODES: "Is there a sore throat, runny nose or other symptoms of a cold?"      Yes, doctoring a cold right now 5. GROIN OR ARMPIT NODES: "Is there a sore, scratch, cut or painful red area on that arm or leg?"      No 6. FEVER: "Do you have a fever?" If so, ask: "What is it, how was it measured, and when did it start?"      No 7. CAUSE: "What do you think is  causing the swollen lymph nodes?"     I don't know 8. OTHER SYMPTOMS: "Do you have any other symptoms?"     No 9. PREGNANCY: "Is there any chance you are pregnant?" "When was your last menstrual period?"     No  Protocols used: LYMPH NODES SWOLLEN-A-AH

## 2018-09-14 NOTE — Progress Notes (Addendum)
Name: Ruth [name]   MRN: [id number]    DOB: Apr 06, 1946   Date:09/14/2018       Progress Note  Subjective  Chief Complaint  Chief Complaint  Patient presents with  . Neck Pain    pain in left side of neck with swelling x 1 week.     HPI  Lymphadenopathy: last week a friend noticed left side of her neck was swollen, she touched the area and is tender to touch over left supra clavicular area, no redness or increase in warmth. She has a cold with rhinorrhea, sneezing and mild cough, but states only after she noticed the swelling on left side of neck.   Polyneuropathy: seen by Dr. Sherryll Burger and was advised to start gabapentin, however she states it caused her to fee sleepy, discussed Lyrica and she is willing to try it. She has pain and numbness on both legs but now also on upper arms. She states better with heat  , worse with pressure    Patient Active Problem List   Diagnosis Date Noted  . Dyslipidemia 11/08/2017  . Vitamin D deficiency 10/31/2016  . Osteopenia of left hip 10/31/2016  . 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  . BPV (benign positional vertigo) 04/17/2016  . RLS (restless legs syndrome) 04/17/2016  . GERD (gastroesophageal reflux disease) 03/11/2016  . History of hyperparathyroidism 03/11/2016  . History of gastric ulcer 03/11/2016  . Essential hypertension 04/24/2015  . Primary osteoarthritis involving multiple joints 04/24/2015  . Lumbago 04/24/2015  . Family history of colonic polyps     Past Surgical History:  Procedure Laterality Date  . CARPAL TUNNEL RELEASE Bilateral 2000  . COLONOSCOPY  06/17/2012   A few two mm ulcers were found in the sigmoid colon, no bleeding present,bx taken-path reporet on the few tiny ulcers ,non specific  . COLONOSCOPY WITH PROPOFOL N/A 06/11/2017   Procedure: COLONOSCOPY WITH PROPOFOL;  Surgeon: Kieth Brightly, MD;  Location: [facility] END[facility];  Service: Endoscopy;  Laterality: N/A;  . parathyroid surgery  2008  .  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 TUBAL LIGATION      Family History  Problem Relation Age of Onset  . Kidney disease Mother   . Ovarian cancer Mother   . Hypertension Father   . Stroke Father   . Lupus Sister     Social History   Socioeconomic History  . Marital status: Married    Spouse name: Jaidy Cottam   . Number of children: 1  . Years of education: Not on file  . Highest education level: Some college, no degree  Occupational History  . Not on file  Social Needs  . Financial resource strain: Not hard at all  . Food insecurity:    Worry: Never true    Inability: Never true  . Transportation needs:    Medical: No    Non-medical: No  Tobacco Use  . Smoking status: Never Smoker  . Smokeless tobacco: Never Used  Substance and Sexual Activity  . Alcohol use: Yes    Comment: wine; 3-4 times per year  . Drug use: No  . Sexual activity: Yes    Partners: Male    Birth control/protection: Post-menopausal  Lifestyle  . Physical activity:    Days per week: 3 days    Minutes per session: 20 min  . Stress: Not at all  Relationships  . Social connections:    Talks on phone: More than three times a week  Gets together: More than three times a week    Attends religious service: More than 4 times per year    Active member of club or organization: Yes    Attends meetings of clubs or organizations: More than 4 times per year    Relationship status: Married  . Intimate partner violence:    Fear of current or ex partner: No    Emotionally abused: No    Physically abused: No    Forced sexual activity: No  Other Topics Concern  . Not on file  Social History Narrative   Married, retired, involved in church and has one grown son      Current Outpatient Medications:  .  acetaminophen (TYLENOL) 500 MG tablet, Take 1-2 tablets (500-1,000 mg total) by mouth every 8 (eight) hours as needed., Disp: 100 tablet, Rfl: 2 .  albuterol  (VENTOLIN HFA) 108 (90 Base) MCG/ACT inhaler, Inhale 2 puffs into the lungs every 6 (six) hours as needed for wheezing or shortness of breath., Disp: 1 Inhaler, Rfl: 0 .  amLODipine (NORVASC) 2.5 MG tablet, Take 1 tablet (2.5 mg total) by mouth daily., Disp: 90 tablet, Rfl: 1 .  Ascorbic Acid (VITAMIN C) 1000 MG tablet, Take by mouth., Disp: , Rfl:  .  Biotin 1000 MCG CHEW, Chew by mouth., Disp: , Rfl:  .  BREO ELLIPTA 200-25 MCG/INH AEPB, Take 1 puff by mouth daily., Disp: , Rfl:  .  Calcium-Vitamin D 600-200 MG-UNIT tablet, Take by mouth., Disp: , Rfl:  .  Cholecalciferol (VITAMIN D3) 1000 units CAPS, Take by mouth., Disp: , Rfl:  .  L-Lysine 500 MG TABS, Take by mouth., Disp: , Rfl:  .  levocetirizine (XYZAL) 5 MG tablet, Take 1 tablet (5 mg total) by mouth every evening., Disp: 30 tablet, Rfl: 0 .  magnesium oxide (MAG-OX) 400 MG tablet, Take by mouth., Disp: , Rfl:  .  mometasone (NASONEX) 50 MCG/ACT nasal spray, Place 2 sprays into the nose daily., Disp: 17 g, Rfl: 12 .  montelukast (SINGULAIR) 10 MG tablet, Take 1 tablet (10 mg total) by mouth at bedtime., Disp: 90 tablet, Rfl: 1 .  Nutritional Supplements (JOINT FORMULA PO), Take 2 tablets by mouth daily. , Disp: , Rfl:  .  olmesartan-hydrochlorothiazide (BENICAR HCT) 20-12.5 MG tablet, Take 1 tablet by mouth daily., Disp: 90 tablet, Rfl: 1 .  omeprazole (PRILOSEC) 40 MG capsule, Take 1 capsule (40 mg total) by mouth daily., Disp: 90 capsule, Rfl: 1 .  Potassium Gluconate ([employer]S POTASSIUM GLUCONATE) 2 MEQ TABS, Take by mouth., Disp: , Rfl:  .  rOPINIRole (REQUIP) 0.5 MG tablet, Take 1 tablet (0.5 mg total) by mouth at bedtime., Disp: 90 tablet, Rfl: 1 .  Cyanocobalamin (B-12) 2000 MCG TABS, Take 1 tablet by mouth daily., Disp: , Rfl:  .  pregabalin (LYRICA) 50 MG capsule, Take 1 capsule (50 mg total) by mouth 3 (three) times daily., Disp: 90 capsule, Rfl: 0  Current Facility-Administered Medications:  .  cyanocobalamin ((VITAMIN B-12))  injection 1,000 mcg, 1,000 mcg, Subcutaneous, Q30 days, Alba Cory, MD, 1,000 mcg at 09/08/18 1111  Allergies  Allergen Reactions  . Ace Inhibitors Cough  . Aspirin Nausea Only    I personally reviewed active problem list, medication list, allergies, family history, social history with the patient/caregiver today.   ROS  Constitutional: Negative for fever or weight change.  Respiratory: Positive  for cough but no  shortness of breath.   Cardiovascular: Negative for chest pain or palpitations.  Gastrointestinal: Negative for abdominal pain, no bowel changes.  Musculoskeletal: Negative for gait problem or joint swelling.  Skin: Negative for rash.  Neurological: Negative for dizziness or headache.  No other specific complaints in a complete review of systems (except as listed in HPI above).  Objective  Vitals:   09/14/18 1315  BP: 120/70  Pulse: 97  Resp: 16  Temp: 98.6 F (37 C)  TempSrc: Oral  SpO2: 98%  Weight: 143 lb 11.2 oz (65.2 kg)  Height: 5' (1.524 m)    Body mass index is 28.06 kg/m.  Physical Exam  Constitutional: Patient appears well-developed and well-nourished. Overweight. No distress.  HEENT: head atraumatic, normocephalic, pupils equal and reactive to light, neck supple, throat within normal limits, clear rhinorrhea.  Cardiovascular: Normal rate, regular rhythm and normal heart sounds.  No murmur heard. No BLE edema. Pulmonary/Chest: Effort normal and breath sounds normal. No respiratory distress. Abdominal: Soft.  There is no tenderness. Psychiatric: Patient has a normal mood and affect. behavior is normal. Judgment and thought content normal.  PHQ2/9: Depression screen [facility] 2/9 09/14/2018 06/09/2018 11/05/2017 06/18/2017 03/05/2017  Decreased Interest 0 0 0 0 0  Down, Depressed, Hopeless 0 0 0 0 0  PHQ - 2 Score 0 0 0 0 0     Fall Risk: Fall Risk  09/14/2018 06/09/2018 03/05/2018 11/05/2017 09/18/2017  Falls in the past year? 0 No No No No   Number falls in past yr: 0 - - - -  Injury with Fall? 0 - - - -      Assessment & Plan  1. Lymphadenopathy, supraclavicular  - US SOFT TISSUE HEAD & NECK (NON-THYROID); Future - pregabalin (LYRICA) 50 MG capsule; Take 1 capsule (50 mg total) by mouth 3 (three) times daily.  Dispense: 90 capsule; Refill: 0  It needs to be done this week , do not touch area, go to [facility] if gets larger or causes pain, SOB or worsening of symptoms   2. Peripheral polyneuropathy  - pregabalin (LYRICA) 50 MG capsule; Take 1 capsule (50 mg total) by mouth 3 (three) times daily.  Dispense: 90 capsule; Refill: 0

## 2018-09-14 NOTE — Addendum Note (Signed)
Addended by: Tommie Raymond on: 09/14/2018 01:45 PM   Modules accepted: Orders

## 2018-09-15 LAB — CBC WITH DIFFERENTIAL/PLATELET
Absolute Monocytes: 869 cells/uL (ref 200–950)
Basophils Absolute: 43 cells/uL (ref 0–200)
Basophils Relative: 0.5 %
Eosinophils Absolute: 206 cells/uL (ref 15–500)
Eosinophils Relative: 2.4 %
HCT: 39 % (ref 35.0–45.0)
Hemoglobin: 13.4 g/dL (ref 11.7–15.5)
Lymphs Abs: 1617 cells/uL (ref 850–3900)
MCH: 31.4 pg (ref 27.0–33.0)
MCHC: 34.4 g/dL (ref 32.0–36.0)
MCV: 91.3 fL (ref 80.0–100.0)
MPV: 9.7 fL (ref 7.5–12.5)
Monocytes Relative: 10.1 %
Neutro Abs: 5865 cells/uL (ref 1500–7800)
Neutrophils Relative %: 68.2 %
Platelets: 294 10*3/uL (ref 140–400)
RBC: 4.27 10*6/uL (ref 3.80–5.10)
RDW: 11.6 % (ref 11.0–15.0)
Total Lymphocyte: 18.8 %
WBC: 8.6 10*3/uL (ref 3.8–10.8)

## 2018-09-15 LAB — C-REACTIVE PROTEIN: CRP: 6.7 mg/L (ref <8.0)

## 2018-09-24 ENCOUNTER — Ambulatory Visit
Admission: RE | Admit: 2018-09-24 | Discharge: 2018-09-24 | Disposition: A | Discharge status: Home / Self Care | Payer: Medicare HMO | Source: Ambulatory Visit | Attending: Family Medicine | Admitting: Family Medicine

## 2018-09-24 DIAGNOSIS — R221 Localized swelling, mass and lump, neck: Secondary | ICD-10-CM | POA: Diagnosis not present

## 2018-09-24 DIAGNOSIS — R590 Localized enlarged lymph nodes: Secondary | ICD-10-CM | POA: Diagnosis not present

## 2018-10-06 ENCOUNTER — Ambulatory Visit: Payer: Medicare HMO

## 2018-10-09 ENCOUNTER — Ambulatory Visit (INDEPENDENT_AMBULATORY_CARE_PROVIDER_SITE_OTHER): Payer: Medicare HMO

## 2018-10-09 DIAGNOSIS — E538 Deficiency of other specified B group vitamins: Secondary | ICD-10-CM

## 2018-10-09 MED ORDER — CYANOCOBALAMIN 1000 MCG/ML IJ SOLN
1000.0000 ug | Freq: Once | INTRAMUSCULAR | Status: AC
  Administered 2018-10-09: 1000 ug via INTRAMUSCULAR

## 2018-10-15 ENCOUNTER — Ambulatory Visit (INDEPENDENT_AMBULATORY_CARE_PROVIDER_SITE_OTHER): Payer: Medicare HMO | Admitting: Family Medicine

## 2018-10-15 ENCOUNTER — Encounter: Payer: Self-pay | Admitting: Family Medicine

## 2018-10-15 VITALS — BP 122/64 | HR 82 | Temp 97.6°F | Resp 16 | Ht 60.0 in | Wt 144.7 lb

## 2018-10-15 DIAGNOSIS — K219 Gastro-esophageal reflux disease without esophagitis: Secondary | ICD-10-CM

## 2018-10-15 DIAGNOSIS — G2581 Restless legs syndrome: Secondary | ICD-10-CM | POA: Diagnosis not present

## 2018-10-15 DIAGNOSIS — E559 Vitamin D deficiency, unspecified: Secondary | ICD-10-CM

## 2018-10-15 DIAGNOSIS — I10 Essential (primary) hypertension: Secondary | ICD-10-CM

## 2018-10-15 DIAGNOSIS — E538 Deficiency of other specified B group vitamins: Secondary | ICD-10-CM | POA: Diagnosis not present

## 2018-10-15 DIAGNOSIS — G629 Polyneuropathy, unspecified: Secondary | ICD-10-CM

## 2018-10-15 DIAGNOSIS — E781 Pure hyperglyceridemia: Secondary | ICD-10-CM

## 2018-10-15 MED ORDER — AMLODIPINE BESYLATE 2.5 MG PO TABS: 2.5000 mg | ORAL_TABLET | Freq: Every day | ORAL | 1 refills | Status: DC

## 2018-10-15 MED ORDER — PREGABALIN 100 MG PO CAPS: 100.0000 mg | ORAL_CAPSULE | Freq: Every day | ORAL | 1 refills | Status: DC

## 2018-10-15 MED ORDER — OMEPRAZOLE 40 MG PO CPDR: 40.0000 mg | DELAYED_RELEASE_CAPSULE | Freq: Every day | ORAL | 1 refills | Status: DC

## 2018-10-15 MED ORDER — OLMESARTAN MEDOXOMIL-HCTZ 20-12.5 MG PO TABS: 1.0000 | ORAL_TABLET | Freq: Every day | ORAL | 1 refills | Status: DC

## 2018-10-15 MED ORDER — ROPINIROLE HCL 0.5 MG PO TABS: 0.5000 mg | ORAL_TABLET | Freq: Every day | ORAL | 1 refills | Status: DC

## 2018-10-15 NOTE — Progress Notes (Signed)
Name: Ruth [name]   MRN: [id number]    DOB: 11/06/1945   Date:10/15/2018       Progress Note  Subjective  Chief Complaint  Chief Complaint  Patient presents with  . Follow-up    1 month F/U  . Polyneuropathy    Has been taking the Lyrica daily and her cramps have decreased. Could not tolerate the three tablets daily and decreased to one a day.    HPI  Lymphadenopathy: she spoke to twin sister and she has the same prominence on right side, she had US done last month and negative, discussed x-ray but she wants to hold off for now, she also has scoliosis. Reassurance today   Polyneuropathy: seen by Dr. Sherryll BurgerShah and was advised to start gabapentin, however it caused her to be very sleep, we changed to lyrica 50 mg last month and she states it has helped and is sleeping well at night, however unable to take it during the day, we will increase dose but to take at night only. She also takes Requip for RLS and cramps has decreased also . She has B12 deficiency and is getting monthly injections  HTN: bp is at goal, denies dizziness, chest pain or palpitation. We will recheck labs  Dyslipidemia: LDL slightly up, triglyceride higher, we will recheck levels     Patient Active Problem List   Diagnosis Date Noted  . Dyslipidemia 11/08/2017  . Vitamin D deficiency 10/31/2016  . Osteopenia of left hip 10/31/2016  . 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  . BPV (benign positional vertigo) 04/17/2016  . RLS (restless legs syndrome) 04/17/2016  . GERD (gastroesophageal reflux disease) 03/11/2016  . History of hyperparathyroidism 03/11/2016  . History of gastric ulcer 03/11/2016  . Essential hypertension 04/24/2015  . Primary osteoarthritis involving multiple joints 04/24/2015  . Lumbago 04/24/2015  . Family history of colonic polyps     Past Surgical History:  Procedure Laterality Date  . CARPAL TUNNEL RELEASE Bilateral 2000  . COLONOSCOPY  06/17/2012   A few two mm ulcers were found in  the sigmoid colon, no bleeding present,bx taken-path reporet on the few tiny ulcers ,non specific  . COLONOSCOPY WITH PROPOFOL N/A 06/11/2017   Procedure: COLONOSCOPY WITH PROPOFOL;  Surgeon: Kieth BrightlySankar, Seeplaputhur G, MD;  Location: [facility] ENDOSCOPY;  Service: Endoscopy;  Laterality: N/A;  . parathyroid surgery  2008  . 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 TUBAL LIGATION      Family History  Problem Relation Age of Onset  . Kidney disease Mother   . Ovarian cancer Mother   . Hypertension Father   . Stroke Father   . Lupus Sister     Social History   Socioeconomic History  . Marital status: Married    Spouse name: Nilda SimmerFrederick  Zabriskie   . Number of children: 1  . Years of education: Not on file  . Highest education level: Some college, no degree  Occupational History  . Not on file  Social Needs  . Financial resource strain: Not hard at all  . Food insecurity:    Worry: Never true    Inability: Never true  . Transportation needs:    Medical: No    Non-medical: No  Tobacco Use  . Smoking status: Never Smoker  . Smokeless tobacco: Never Used  Substance and Sexual Activity  . Alcohol use: Yes    Comment: wine; 3-4 times per year  . Drug use: No  . Sexual activity: Yes  Partners: Male    Birth control/protection: Post-menopausal  Lifestyle  . Physical activity:    Days per week: 3 days    Minutes per session: 20 min  . Stress: Not at all  Relationships  . Social connections:    Talks on phone: More than three times a week    Gets together: More than three times a week    Attends religious service: More than 4 times per year    Active member of club or organization: Yes    Attends meetings of clubs or organizations: More than 4 times per year    Relationship status: Married  . Intimate partner violence:    Fear of current or ex partner: No    Emotionally abused: No    Physically abused: No    Forced sexual activity: No  Other  Topics Concern  . Not on file  Social History Narrative   Married, retired, involved in church and has one grown son      Current Outpatient Medications:  .  acetaminophen (TYLENOL) 500 MG tablet, Take 1-2 tablets (500-1,000 mg total) by mouth every 8 (eight) hours as needed., Disp: 100 tablet, Rfl: 2 .  albuterol (VENTOLIN HFA) 108 (90 Base) MCG/ACT inhaler, Inhale 2 puffs into the lungs every 6 (six) hours as needed for wheezing or shortness of breath., Disp: 1 Inhaler, Rfl: 0 .  amLODipine (NORVASC) 2.5 MG tablet, Take 1 tablet (2.5 mg total) by mouth daily., Disp: 90 tablet, Rfl: 1 .  Ascorbic Acid (VITAMIN C) 1000 MG tablet, Take by mouth., Disp: , Rfl:  .  BREO ELLIPTA 200-25 MCG/INH AEPB, Take 1 puff by mouth daily., Disp: , Rfl:  .  Calcium-Vitamin D 600-200 MG-UNIT tablet, Take by mouth., Disp: , Rfl:  .  Cholecalciferol (VITAMIN D3) 1000 units CAPS, Take by mouth., Disp: , Rfl:  .  Cyanocobalamin (B-12) 2000 MCG TABS, Take 1 tablet by mouth daily., Disp: , Rfl:  .  L-Lysine 500 MG TABS, Take by mouth., Disp: , Rfl:  .  levocetirizine (XYZAL) 5 MG tablet, Take 1 tablet (5 mg total) by mouth every evening., Disp: 30 tablet, Rfl: 0 .  magnesium oxide (MAG-OX) 400 MG tablet, Take by mouth., Disp: , Rfl:  .  mometasone (NASONEX) 50 MCG/ACT nasal spray, Place 2 sprays into the nose daily., Disp: 17 g, Rfl: 12 .  montelukast (SINGULAIR) 10 MG tablet, Take 1 tablet (10 mg total) by mouth at bedtime., Disp: 90 tablet, Rfl: 1 .  Nutritional Supplements (JOINT FORMULA PO), Take 2 tablets by mouth daily. , Disp: , Rfl:  .  olmesartan-hydrochlorothiazide (BENICAR HCT) 20-12.5 MG tablet, Take 1 tablet by mouth daily., Disp: 90 tablet, Rfl: 1 .  omeprazole (PRILOSEC) 40 MG capsule, Take 1 capsule (40 mg total) by mouth daily., Disp: 90 capsule, Rfl: 1 .  Potassium Gluconate ([employer]S POTASSIUM GLUCONATE) 2 MEQ TABS, Take by mouth., Disp: , Rfl:  .  pregabalin (LYRICA) 50 MG capsule, Take 1 capsule (50  mg total) by mouth 3 (three) times daily. (Patient taking differently: Take 50 mg by mouth daily. ), Disp: 90 capsule, Rfl: 0 .  rOPINIRole (REQUIP) 0.5 MG tablet, Take 1 tablet (0.5 mg total) by mouth at bedtime., Disp: 90 tablet, Rfl: 1  Current Facility-Administered Medications:  .  cyanocobalamin ((VITAMIN B-12)) injection 1,000 mcg, 1,000 mcg, Subcutaneous, Q30 days, Alba Cory, MD, 1,000 mcg at 09/08/18 1111  Allergies  Allergen Reactions  . Ace Inhibitors Cough  . Aspirin Nausea  Only    I personally reviewed active problem list, medication list, allergies, family history, social history with the patient/caregiver today.   ROS  Constitutional: Negative for fever or weight change.  Respiratory: Negative for cough and shortness of breath.   Cardiovascular: Negative for chest pain or palpitations.  Gastrointestinal: Negative for abdominal pain, no bowel changes.  Musculoskeletal: Negative for gait problem or joint swelling.  Skin: Negative for rash.  Neurological: Negative for dizziness or headache.  No other specific complaints in a complete review of systems (except as listed in HPI above).  Objective  Vitals:   10/15/18 1118  BP: 122/64  Pulse: 82  Resp: 16  Temp: 97.6 F (36.4 C)  TempSrc: Oral  SpO2: 99%  Weight: 144 lb 11.2 oz (65.6 kg)  Height: 5' (1.524 m)    Body mass index is 28.26 kg/m.  Physical Exam  Constitutional: Patient appears well-developed and well-nourished. Overweight. No distress.  HEENT: head atraumatic, normocephalic, pupils equal and reactive to light,  neck supple, throat within normal limits Cardiovascular: Normal rate, regular rhythm and normal heart sounds.  No murmur heard. No BLE edema. Pulmonary/Chest: Effort normal and breath sounds normal. No respiratory distress. Abdominal: Soft.  There is no tenderness. Muscular Skeletal: prominent left clavicle joint Psychiatric: Patient has a normal mood and affect. behavior is normal.  Judgment and thought content normal.  Recent Results (from the past 2160 hour(s))  C-reactive protein     Status: None   Collection Time: 09/14/18  1:57 PM  Result Value Ref Range   CRP 6.7 <8.0 mg/L  CBC with Differential/Platelet     Status: None   Collection Time: 09/14/18  1:57 PM  Result Value Ref Range   WBC 8.6 3.8 - 10.8 Thousand/uL   RBC 4.27 3.80 - 5.10 Million/uL   Hemoglobin 13.4 11.7 - 15.5 g/dL   HCT 78.4 69.6 - 29.5 %   MCV 91.3 80.0 - 100.0 fL   MCH 31.4 27.0 - 33.0 pg   MCHC 34.4 32.0 - 36.0 g/dL   RDW 28.4 13.2 - 44.0 %   Platelets 294 140 - 400 Thousand/uL   MPV 9.7 7.5 - 12.5 fL   Neutro Abs 5,865 1,500 - 7,800 cells/uL   Lymphs Abs 1,617 850 - 3,900 cells/uL   Absolute Monocytes 869 200 - 950 cells/uL   Eosinophils Absolute 206 15 - 500 cells/uL   Basophils Absolute 43 0 - 200 cells/uL   Neutrophils Relative % 68.2 %   Total Lymphocyte 18.8 %   Monocytes Relative 10.1 %   Eosinophils Relative 2.4 %   Basophils Relative 0.5 %     PHQ2/9: Depression screen [facility] 2/9 09/14/2018 06/09/2018 11/05/2017 06/18/2017 03/05/2017  Decreased Interest 0 0 0 0 0  Down, Depressed, Hopeless 0 0 0 0 0  PHQ - 2 Score 0 0 0 0 0     Fall Risk: Fall Risk  09/14/2018 06/09/2018 03/05/2018 11/05/2017 09/18/2017  Falls in the past year? 0 No No No No  Number falls in past yr: 0 - - - -  Injury with Fall? 0 - - - -    Functional Status Survey: Is the patient deaf or have difficulty hearing?: No Does the patient have difficulty seeing, even when wearing glasses/contacts?: Yes Does the patient have difficulty concentrating, remembering, or making decisions?: No Does the patient have difficulty walking or climbing stairs?: No Does the patient have difficulty dressing or bathing?: No Does the patient have difficulty doing errands alone  such as visiting a doctor's office or shopping?: No   Assessment & Plan  1. Essential hypertension  - amLODipine (NORVASC) 2.5 MG tablet;  Take 1 tablet (2.5 mg total) by mouth daily.  Dispense: 90 tablet; Refill: 1 - olmesartan-hydrochlorothiazide (BENICAR HCT) 20-12.5 MG tablet; Take 1 tablet by mouth daily.  Dispense: 90 tablet; Refill: 1 - COMPLETE METABOLIC PANEL WITH GFR - CBC  2. Gastroesophageal reflux disease without esophagitis  - omeprazole (PRILOSEC) 40 MG capsule; Take 1 capsule (40 mg total) by mouth daily.  Dispense: 90 capsule; Refill: 1  3. Peripheral polyneuropathy  - pregabalin (LYRICA) 100 MG capsule; Take 1 capsule (100 mg total) by mouth at bedtime.  Dispense: 90 capsule; Refill: 1  4. RLS (restless legs syndrome)  - rOPINIRole (REQUIP) 0.5 MG tablet; Take 1 tablet (0.5 mg total) by mouth at bedtime.  Dispense: 90 tablet; Refill: 1  5. Vitamin D deficiency  - VITAMIN D 25 Hydroxy (Vit-D Deficiency, Fractures)  6. B12 deficiency   7. Hypertriglyceridemia  - Lipid panel

## 2018-10-29 DIAGNOSIS — R200 Anesthesia of skin: Secondary | ICD-10-CM | POA: Diagnosis not present

## 2018-10-29 DIAGNOSIS — R0683 Snoring: Secondary | ICD-10-CM | POA: Diagnosis not present

## 2018-10-29 DIAGNOSIS — E611 Iron deficiency: Secondary | ICD-10-CM | POA: Diagnosis not present

## 2018-10-29 DIAGNOSIS — G2581 Restless legs syndrome: Secondary | ICD-10-CM | POA: Diagnosis not present

## 2018-10-29 DIAGNOSIS — R202 Paresthesia of skin: Secondary | ICD-10-CM | POA: Diagnosis not present

## 2018-11-09 ENCOUNTER — Ambulatory Visit: Payer: Medicare HMO

## 2018-11-11 ENCOUNTER — Ambulatory Visit: Payer: Medicare HMO

## 2018-11-12 ENCOUNTER — Ambulatory Visit (INDEPENDENT_AMBULATORY_CARE_PROVIDER_SITE_OTHER): Payer: Medicare HMO

## 2018-11-12 DIAGNOSIS — E781 Pure hyperglyceridemia: Secondary | ICD-10-CM | POA: Diagnosis not present

## 2018-11-12 DIAGNOSIS — E538 Deficiency of other specified B group vitamins: Secondary | ICD-10-CM

## 2018-11-12 DIAGNOSIS — E559 Vitamin D deficiency, unspecified: Secondary | ICD-10-CM | POA: Diagnosis not present

## 2018-11-12 DIAGNOSIS — I10 Essential (primary) hypertension: Secondary | ICD-10-CM | POA: Diagnosis not present

## 2018-11-12 NOTE — Progress Notes (Signed)
Patient came in for her B-12 injection. She tolerated it well. NKDA.   

## 2018-11-13 LAB — COMPLETE METABOLIC PANEL WITH GFR
AG Ratio: 1.6 (calc) (ref 1.0–2.5)
ALT: 17 U/L (ref 6–29)
AST: 23 U/L (ref 10–35)
Albumin: 4.3 g/dL (ref 3.6–5.1)
Alkaline phosphatase (APISO): 49 U/L (ref 37–153)
BUN: 14 mg/dL (ref 7–25)
CO2: 28 mmol/L (ref 20–32)
Calcium: 10.2 mg/dL (ref 8.6–10.4)
Chloride: 102 mmol/L (ref 98–110)
Creat: 0.86 mg/dL (ref 0.60–0.93)
GFR, Est African American: 78 mL/min/{1.73_m2} (ref >=60)
GFR, Est Non African American: 67 mL/min/{1.73_m2} (ref >=60)
Globulin: 2.7 g/dL (calc) (ref 1.9–3.7)
Glucose, Bld: 74 mg/dL (ref 65–99)
Potassium: 3.6 mmol/L (ref 3.5–5.3)
Sodium: 142 mmol/L (ref 135–146)
Total Bilirubin: 0.5 mg/dL (ref 0.2–1.2)
Total Protein: 7 g/dL (ref 6.1–8.1)

## 2018-11-13 LAB — CBC
HCT: 39.9 % (ref 35.0–45.0)
Hemoglobin: 14.1 g/dL (ref 11.7–15.5)
MCH: 32.5 pg (ref 27.0–33.0)
MCHC: 35.3 g/dL (ref 32.0–36.0)
MCV: 91.9 fL (ref 80.0–100.0)
MPV: 9.3 fL (ref 7.5–12.5)
Platelets: 334 10*3/uL (ref 140–400)
RBC: 4.34 10*6/uL (ref 3.80–5.10)
RDW: 11.3 % (ref 11.0–15.0)
WBC: 8.5 10*3/uL (ref 3.8–10.8)

## 2018-11-13 LAB — VITAMIN D 25 HYDROXY (VIT D DEFICIENCY, FRACTURES): Vit D, 25-Hydroxy: 54 ng/mL (ref 30–100)

## 2018-11-13 LAB — LIPID PANEL
Cholesterol: 151 mg/dL (ref <200)
HDL: 48 mg/dL — ABNORMAL LOW (ref >=50)
LDL Cholesterol (Calc): 82 mg/dL (calc)
Non-HDL Cholesterol (Calc): 103 mg/dL (calc) (ref <130)
Total CHOL/HDL Ratio: 3.1 (calc) (ref <5.0)
Triglycerides: 118 mg/dL (ref <150)

## 2018-11-19 ENCOUNTER — Ambulatory Visit (INDEPENDENT_AMBULATORY_CARE_PROVIDER_SITE_OTHER): Payer: Medicare HMO

## 2018-11-19 VITALS — BP 114/64 | HR 69 | Temp 98.2°F | Resp 16 | Ht 60.0 in | Wt 146.0 lb

## 2018-11-19 DIAGNOSIS — Z0000 Encounter for general adult medical examination without abnormal findings: Secondary | ICD-10-CM

## 2018-11-19 DIAGNOSIS — Z1231 Encounter for screening mammogram for malignant neoplasm of breast: Secondary | ICD-10-CM

## 2018-11-19 NOTE — Patient Instructions (Signed)
Ruth [name] , Thank you for taking time to come for your Medicare Wellness Visit. I appreciate your ongoing commitment to your health goals. Please review the following plan we discussed and let me know if I can assist you in the future.   Screening recommendations/referrals: Colonoscopy: done 06/11/17. Repeat in 2028. Mammogram: done 01/17/17. Please call [phone] to schedule your mammogram.  Bone Density: done 01/15/17. Recommended yearly ophthalmology/optometry visit for glaucoma screening and checkup Recommended yearly dental visit for hygiene and checkup  Vaccinations: Influenza vaccine: done 06/09/18 Pneumococcal vaccine: done 12/09/13 Tdap vaccine: done 04/13/12 Shingles vaccine: Shingrix discussed. Please contact your pharmacy for coverage information.   Advanced directives: Advance directive discussed with you today. I have provided a copy for you to complete at home and have notarized. Once this is complete please bring a copy in to our office so we can scan it into your chart.  Conditions/risks identified: Recommend drinking 6-8 glasses of water per day.   Next appointment: Please follow up in one year for your Medicare Annual Wellness visit.     Preventive Care 72 Years and Older, Female Preventive care refers to lifestyle choices and visits with your health care provider that can promote health and wellness. What does preventive care include?  A yearly physical exam. This is also called an annual well check.  Dental exams once or twice a year.  Routine eye exams. Ask your health care provider how often you should have your eyes checked.  Personal lifestyle choices, including:  Daily care of your teeth and gums.  Regular physical activity.  Eating a healthy diet.  Avoiding tobacco and drug use.  Limiting alcohol use.  Practicing safe sex.  Taking low-dose aspirin every day.  Taking vitamin and mineral supplements as recommended by your health care  provider. What happens during an annual well check? The services and screenings done by your health care provider during your annual well check will depend on your age, overall health, lifestyle risk factors, and family history of disease. Counseling  Your health care provider may ask you questions about your:  Alcohol use.  Tobacco use.  Drug use.  Emotional well-being.  Home and relationship well-being.  Sexual activity.  Eating habits.  History of falls.  Memory and ability to understand (cognition).  Work and work Astronomer.  Reproductive health. Screening  You may have the following tests or measurements:  Height, weight, and BMI.  Blood pressure.  Lipid and cholesterol levels. These may be checked every 5 years, or more frequently if you are over 51 years old.  Skin check.  Lung cancer screening. You may have this screening every year starting at age 53 if you have a 30-pack-year history of smoking and currently smoke or have quit within the past 15 years.  Fecal occult blood test (FOBT) of the stool. You may have this test every year starting at age 60.  Flexible sigmoidoscopy or colonoscopy. You may have a sigmoidoscopy every 5 years or a colonoscopy every 10 years starting at age 61.  Hepatitis C blood test.  Hepatitis B blood test.  Sexually transmitted disease (STD) testing.  Diabetes screening. This is done by checking your blood sugar (glucose) after you have not eaten for a while (fasting). You may have this done every 1-3 years.  Bone density scan. This is done to screen for osteoporosis. You may have this done starting at age 49.  Mammogram. This may be done every 1-2 years. Talk to your health care  provider about how often you should have regular mammograms. Talk with your health care provider about your test results, treatment options, and if necessary, the need for more tests. Vaccines  Your health care provider may recommend certain  vaccines, such as:  Influenza vaccine. This is recommended every year.  Tetanus, diphtheria, and acellular pertussis (Tdap, Td) vaccine. You may need a Td booster every 10 years.  Zoster vaccine. You may need this after age 48.  Pneumococcal 13-valent conjugate (PCV13) vaccine. One dose is recommended after age 55.  Pneumococcal polysaccharide (PPSV23) vaccine. One dose is recommended after age 84. Talk to your health care provider about which screenings and vaccines you need and how often you need them. This information is not intended to replace advice given to you by your health care provider. Make sure you discuss any questions you have with your health care provider. Document Released: 10/06/2015 Document Revised: 05/29/2016 Document Reviewed: 07/11/2015 Elsevier Interactive Patient Education  2017 [place] Prevention in the Home Falls can cause injuries. They can happen to people of all ages. There are many things you can do to make your home safe and to help prevent falls. What can I do on the outside of my home?  Regularly fix the edges of walkways and driveways and fix any cracks.  Remove anything that might make you trip as you walk through a door, such as a raised step or threshold.  Trim any bushes or trees on the path to your home.  Use bright outdoor lighting.  Clear any walking paths of anything that might make someone trip, such as rocks or tools.  Regularly check to see if handrails are loose or broken. Make sure that both sides of any steps have handrails.  Any raised decks and porches should have guardrails on the edges.  Have any leaves, snow, or ice cleared regularly.  Use sand or salt on walking paths during winter.  Clean up any spills in your garage right away. This includes oil or grease spills. What can I do in the bathroom?  Use night lights.  Install grab bars by the toilet and in the tub and shower. Do not use towel bars as grab  bars.  Use non-skid mats or decals in the tub or shower.  If you need to sit down in the shower, use a plastic, non-slip stool.  Keep the floor dry. Clean up any water that spills on the floor as soon as it happens.  Remove soap buildup in the tub or shower regularly.  Attach bath mats securely with double-sided non-slip rug tape.  Do not have throw rugs and other things on the floor that can make you trip. What can I do in the bedroom?  Use night lights.  Make sure that you have a light by your bed that is easy to reach.  Do not use any sheets or blankets that are too big for your bed. They should not hang down onto the floor.  Have a firm chair that has side arms. You can use this for support while you get dressed.  Do not have throw rugs and other things on the floor that can make you trip. What can I do in the kitchen?  Clean up any spills right away.  Avoid walking on wet floors.  Keep items that you use a lot in easy-to-reach places.  If you need to reach something above you, use a strong step stool that has a grab bar.  Keep electrical cords out of the way.  Do not use floor polish or wax that makes floors slippery. If you must use wax, use non-skid floor wax.  Do not have throw rugs and other things on the floor that can make you trip. What can I do with my stairs?  Do not leave any items on the stairs.  Make sure that there are handrails on both sides of the stairs and use them. Fix handrails that are broken or loose. Make sure that handrails are as long as the stairways.  Check any carpeting to make sure that it is firmly attached to the stairs. Fix any carpet that is loose or worn.  Avoid having throw rugs at the top or bottom of the stairs. If you do have throw rugs, attach them to the floor with carpet tape.  Make sure that you have a light switch at the top of the stairs and the bottom of the stairs. If you do not have them, ask someone to add them for  you. What else can I do to help prevent falls?  Wear shoes that:  Do not have high heels.  Have rubber bottoms.  Are comfortable and fit you well.  Are closed at the toe. Do not wear sandals.  If you use a stepladder:  Make sure that it is fully opened. Do not climb a closed stepladder.  Make sure that both sides of the stepladder are locked into place.  Ask someone to hold it for you, if possible.  Clearly mark and make sure that you can see:  Any grab bars or handrails.  First and last steps.  Where the edge of each step is.  Use tools that help you move around (mobility aids) if they are needed. These include:  Canes.  Walkers.  Scooters.  Crutches.  Turn on the lights when you go into a dark area. Replace any light bulbs as soon as they burn out.  Set up your furniture so you have a clear path. Avoid moving your furniture around.  If any of your floors are uneven, fix them.  If there are any pets around you, be aware of where they are.  Review your medicines with your doctor. Some medicines can make you feel dizzy. This can increase your chance of falling. Ask your doctor what other things that you can do to help prevent falls. This information is not intended to replace advice given to you by your health care provider. Make sure you discuss any questions you have with your health care provider. Document Released: 07/06/2009 Document Revised: 02/15/2016 Document Reviewed: 10/14/2014 Elsevier Interactive Patient Education  2017 [employer].

## 2018-11-19 NOTE — Progress Notes (Signed)
Subjective:   Ruth [name] is a 73 y.o. female who presents for Medicare Annual (Subsequent) preventive examination.  Review of Systems:   Cardiac Risk Factors include: advanced age (>34men, >54 women);hypertension;dyslipidemia     Objective:     Vitals: BP 114/64 (BP Location: Left Arm, Patient Position: Sitting, Cuff Size: Normal)   Pulse 69   Temp 98.2 F (36.8 C) (Oral)   Resp 16   Ht 5' (1.524 m)   Wt 146 lb (66.2 kg)   BMI 28.51 kg/m   Body mass index is 28.51 kg/m.  Advanced Directives 11/19/2018 06/23/2017 06/18/2017 06/11/2017 03/05/2017 10/31/2016 06/18/2016  Does Patient Have a Medical Advance Directive? No Yes Yes Yes Yes No No  Type of Advance Directive - - Living will Living will Healthcare Power of [place];Living will - -  Copy of Healthcare Power of Attorney in Chart? - - - - No - copy requested - -  Would patient like information on creating a medical advance directive? Yes (MAU/Ambulatory/Procedural Areas - Information given) - - - - - -    Tobacco Social History   Tobacco Use  Smoking Status Never Smoker  Smokeless Tobacco Never Used     Counseling given: Not Answered   Clinical Intake:  Pre-visit preparation completed: Yes  Pain : No/denies pain     BMI - recorded: 28.51 Nutritional Status: BMI 25 -29 Overweight Nutritional Risks: None Diabetes: No  How often do you need to have someone help you when you read instructions, pamphlets, or other written materials from your doctor or pharmacy?: 1 - Never What is the last grade level you completed in school?: some college  Interpreter Needed?: No  Information entered by :: Reather Littler LPN  Past Medical History:  Diagnosis Date  . Allergy    mycins; cause diarrhea  . Arthritis    since 2007  . Breast screening, unspecified   . Family history of colonic polyps   . History of cystitis 1987  . Hypertension    since 2005  . 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 Restless leg  syndrome   . Ulcer 1966   Past Surgical History:  Procedure Laterality Date  . CARPAL TUNNEL RELEASE Bilateral 2000  . COLONOSCOPY  06/17/2012   A few two mm ulcers were found in the sigmoid colon, no bleeding present,bx taken-path reporet on the few tiny ulcers ,non specific  . COLONOSCOPY WITH PROPOFOL N/A 06/11/2017   Procedure: COLONOSCOPY WITH PROPOFOL;  Surgeon: Kieth Brightly, MD;  Location: [facility] ENDOSCOPY;  Service: Endoscopy;  Laterality: N/A;  . parathyroid surgery  2008  . 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 TUBAL LIGATION     Family History  Problem Relation Age of Onset  . Kidney disease Mother   . Ovarian cancer Mother   . Hypertension Father   . Stroke Father   . Lupus Sister    Social History   Socioeconomic History  . Marital status: Married    Spouse name: Varvara Wallace   . Number of children: 1  . Years of education: Not on file  . Highest education level: Some college, no degree  Occupational History  . Not on file  Social Needs  . Financial resource strain: Not hard at all  . Food insecurity:    Worry: Never true    Inability: Never true  . Transportation needs:    Medical: No    Non-medical: No  Tobacco  Use  . Smoking status: Never Smoker  . Smokeless tobacco: Never Used  Substance and Sexual Activity  . Alcohol use: Yes    Comment: wine  . Drug use: No  . Sexual activity: Yes    Partners: Male    Birth control/protection: Post-menopausal  Lifestyle  . Physical activity:    Days per week: 6 days    Minutes per session: 20 min  . Stress: Not at all  Relationships  . Social connections:    Talks on phone: More than three times a week    Gets together: More than three times a week    Attends religious service: More than 4 times per year    Active member of club or organization: Yes    Attends meetings of clubs or organizations: More than 4 times per year    Relationship status: Married    Other Topics Concern  . Not on file  Social History Narrative   Married, retired, involved in church and has one grown son     Outpatient Encounter Medications as of 11/19/2018  Medication Sig  . acetaminophen (TYLENOL) 500 MG tablet Take 1-2 tablets (500-1,000 mg total) by mouth every 8 (eight) hours as needed.  Marland Kitchen albuterol (VENTOLIN HFA) 108 (90 Base) MCG/ACT inhaler Inhale 2 puffs into the lungs every 6 (six) hours as needed for wheezing or shortness of breath.  Marland Kitchen amLODipine (NORVASC) 2.5 MG tablet Take 1 tablet (2.5 mg total) by mouth daily.  . Ascorbic Acid (VITAMIN C) 1000 MG tablet Take by mouth.  Marland Kitchen BREO ELLIPTA 200-25 MCG/INH AEPB Take 1 puff by mouth daily.  . Calcium-Vitamin D 600-200 MG-UNIT tablet Take by mouth.  . Cholecalciferol (VITAMIN D3) 1000 units CAPS Take by mouth.  . Cyanocobalamin (B-12) 2000 MCG TABS Take 1 tablet by mouth daily.  Marland Kitchen L-Lysine 500 MG TABS Take by mouth.  . levocetirizine (XYZAL) 5 MG tablet Take 1 tablet (5 mg total) by mouth every evening.  . magnesium oxide (MAG-OX) 400 MG tablet Take by mouth.  . montelukast (SINGULAIR) 10 MG tablet Take 1 tablet (10 mg total) by mouth at bedtime.  . Nutritional Supplements (JOINT FORMULA PO) Take 2 tablets by mouth daily.   Marland Kitchen olmesartan-hydrochlorothiazide (BENICAR HCT) 20-12.5 MG tablet Take 1 tablet by mouth daily.  Marland Kitchen omeprazole (PRILOSEC) 40 MG capsule Take 1 capsule (40 mg total) by mouth daily.  . Potassium Gluconate ([facility] POTASSIUM GLUCONATE) 2 MEQ TABS Take by mouth.  Marland Kitchen rOPINIRole (REQUIP) 0.5 MG tablet Take 1 tablet (0.5 mg total) by mouth at bedtime.  . [DISCONTINUED] fluticasone (FLONASE) 50 MCG/ACT nasal spray USE 1 TO 2 SPRAY(S) IN EACH NOSTRIL ONCE DAILY FOR 30 DAYS  . [DISCONTINUED] mometasone (NASONEX) 50 MCG/ACT nasal spray Place 2 sprays into the nose daily.  . [DISCONTINUED] pregabalin (LYRICA) 100 MG capsule Take 1 capsule (100 mg total) by mouth at bedtime.   Facility-Administered Encounter  Medications as of 11/19/2018  Medication  . cyanocobalamin ((VITAMIN B-12)) injection 1,000 mcg    Activities of Daily Living In your present state of health, do you have any difficulty performing the following activities: 11/19/2018 10/15/2018  Hearing? N N  Comment declines hearing aids -  Vision? N Y  Comment wears glasses -  Difficulty concentrating or making decisions? N N  Walking or climbing stairs? N N  Dressing or bathing? N N  Doing errands, shopping? N N  Preparing Food and eating ? N -  Using the Toilet? N -  In the past six months, have you accidently leaked urine? N -  Do you have problems with loss of bowel control? N -  Managing your Medications? N -  Managing your Finances? N -  Housekeeping or managing your Housekeeping? N -  Some recent data might be hidden    Patient Care Team: Alba Cory, MD as PCP - General (Family Medicine) Sidney Ace, MD as Referring Physician (Allergy)    Assessment:   This is a routine wellness examination for Natassia.  Exercise Activities and Dietary recommendations Current Exercise Habits: Home exercise routine, Type of exercise: yoga, Time (Minutes): 20, Frequency (Times/Week): 6, Weekly Exercise (Minutes/Week): 120, Intensity: Mild, Exercise limited by: None identified  Goals    . DIET - INCREASE WATER INTAKE     Recommend drinking 6-8 glasses of water per day       Fall Risk Fall Risk  11/19/2018 09/14/2018 06/09/2018 03/05/2018 11/05/2017  Falls in the past year? 0 0 No No No  Number falls in past yr: 0 0 - - -  Injury with Fall? 0 0 - - -  Follow up Falls prevention discussed - - - -   FALL RISK PREVENTION PERTAINING TO THE HOME:  Any stairs in or around the home? Yes  If so, do they handrails? Yes   Home free of loose throw rugs in walkways, pet beds, electrical cords, etc? Yes  Adequate lighting in your home to reduce risk of falls? Yes   ASSISTIVE DEVICES UTILIZED TO PREVENT FALLS:  Life alert? No  Use  of a cane, walker or w/c? No  Grab bars in the bathroom? No  Shower chair or bench in shower? Yes  Elevated toilet seat or a handicapped toilet? No   DME ORDERS:  DME order needed?  No   TIMED UP AND GO:  Was the test performed? Yes .  Length of time to ambulate 10 feet: 5 sec.   GAIT:  Appearance of gait: Gait stead-fast and without the use of an assistive device.   Education: Fall risk prevention has been discussed.  Intervention(s) required? No    Depression Screen PHQ 2/9 Scores 11/19/2018 09/14/2018 06/09/2018 11/05/2017  PHQ - 2 Score 0 0 0 0     Cognitive Function     6CIT Screen 11/19/2018  What Year? 0 points  What month? 0 points  What time? 0 points  Count back from 20 0 points  Months in reverse 0 points  Repeat phrase 0 points  Total Score 0    Immunization History  Administered Date(s) Administered  . Influenza, High Dose Seasonal PF 06/05/2015, 06/18/2016, 06/17/2017, 06/09/2018  . Influenza-Unspecified 06/05/2015  . Pneumococcal Conjugate-13 12/09/2013  . Pneumococcal Polysaccharide-23 04/13/2012  . Tdap 04/13/2012  . Zoster 07/24/2013    Qualifies for Shingles Vaccine? Yes  Zostavax completed 2014. Due for Shingrix. Education has been provided regarding the importance of this vaccine. Pt has been advised to call insurance company to determine out of pocket expense. Advised may also receive vaccine at local pharmacy or Health Dept. Verbalized acceptance and understanding.  Tdap: Up to date  Flu Vaccine: Up to date  Pneumococcal Vaccine: Up to date   Screening Tests Health Maintenance  Topic Date Due  . MAMMOGRAM  01/15/2018  . TETANUS/TDAP  04/13/2022  . COLONOSCOPY  06/12/2027  . INFLUENZA VACCINE  Completed  . DEXA SCAN  Completed  . Hepatitis C Screening  Completed  . PNA vac Low Risk Adult  Completed  Cancer Screenings:  Colorectal Screening: Completed 06/11/17. Repeat every 10 years;   Mammogram: Completed 06/11/17. Repeat  every year. Ordered today. Pt provided with contact information and advised to call to schedule appt.   Bone Density: Completed 01/15/17. Results reflect  OSTEOPENIA. Repeat every 2 years.   Lung Cancer Screening: (Low Dose CT Chest recommended if Age 66-80 years, 30 pack-year currently smoking OR have quit w/in 15years.) does not qualify.    Additional Screening:  Hepatitis C Screening: does qualify; Completed 04/10/12  Vision Screening: Recommended annual ophthalmology exams for early detection of glaucoma and other disorders of the eye. Is the patient up to date with their annual eye exam?  Yes  Who is the provider or what is the name of the office in which the pt attends annual eye exams? [facility]   Dental Screening: Recommended annual dental exams for proper oral hygiene  Community Resource Referral:  CRR required this visit?  No      Plan:      I have personally reviewed and addressed the Medicare Annual Wellness questionnaire and have noted the following in the patient's chart:  A. Medical and social history B. Use of alcohol, tobacco or illicit drugs  C. Current medications and supplements D. Functional ability and status E.  Nutritional status F.  Physical activity G. Advance directives H. List of other physicians I.  Hospitalizations, surgeries, and ER visits in previous 12 months J.  Vitals K. Screenings such as hearing and vision if needed, cognitive and depression L. Referrals and appointments   In addition, I have reviewed and discussed with patient certain preventive protocols, quality metrics, and best practice recommendations. A written personalized care plan for preventive services as well as general preventive health recommendations were provided to patient.   Signed,  Reather LittlerKasey Eulia Hatcher, LPN Nurse Health Advisor   Nurse Notes: none

## 2018-12-09 ENCOUNTER — Other Ambulatory Visit: Payer: Self-pay

## 2018-12-09 ENCOUNTER — Ambulatory Visit: Payer: Medicare HMO

## 2018-12-09 ENCOUNTER — Ambulatory Visit (INDEPENDENT_AMBULATORY_CARE_PROVIDER_SITE_OTHER): Payer: Medicare HMO

## 2018-12-09 DIAGNOSIS — E538 Deficiency of other specified B group vitamins: Secondary | ICD-10-CM | POA: Diagnosis not present

## 2018-12-09 NOTE — Progress Notes (Signed)
Patient came in for her B-12 injection. She tolerated it well. NKDA.   

## 2019-01-11 ENCOUNTER — Ambulatory Visit (INDEPENDENT_AMBULATORY_CARE_PROVIDER_SITE_OTHER): Payer: Medicare HMO

## 2019-01-11 ENCOUNTER — Other Ambulatory Visit: Payer: Self-pay

## 2019-01-11 DIAGNOSIS — E538 Deficiency of other specified B group vitamins: Secondary | ICD-10-CM | POA: Diagnosis not present

## 2019-01-11 NOTE — Progress Notes (Signed)
Patient came in for her B-12 injection. She tolerated it well. NKDA.   

## 2019-01-27 DIAGNOSIS — G4733 Obstructive sleep apnea (adult) (pediatric): Secondary | ICD-10-CM | POA: Diagnosis not present

## 2019-02-10 ENCOUNTER — Other Ambulatory Visit: Payer: Self-pay

## 2019-02-10 ENCOUNTER — Ambulatory Visit (INDEPENDENT_AMBULATORY_CARE_PROVIDER_SITE_OTHER): Payer: Medicare HMO

## 2019-02-10 DIAGNOSIS — E538 Deficiency of other specified B group vitamins: Secondary | ICD-10-CM

## 2019-02-16 DIAGNOSIS — G4733 Obstructive sleep apnea (adult) (pediatric): Secondary | ICD-10-CM | POA: Diagnosis not present

## 2019-02-18 DIAGNOSIS — H2511 Age-related nuclear cataract, right eye: Secondary | ICD-10-CM | POA: Diagnosis not present

## 2019-03-02 DIAGNOSIS — J069 Acute upper respiratory infection, unspecified: Secondary | ICD-10-CM | POA: Diagnosis not present

## 2019-03-15 ENCOUNTER — Other Ambulatory Visit: Payer: Self-pay

## 2019-03-15 ENCOUNTER — Ambulatory Visit (INDEPENDENT_AMBULATORY_CARE_PROVIDER_SITE_OTHER): Payer: Medicare HMO

## 2019-03-15 DIAGNOSIS — E538 Deficiency of other specified B group vitamins: Secondary | ICD-10-CM

## 2019-03-15 MED ORDER — CYANOCOBALAMIN 1000 MCG/ML IJ SOLN
1000.0000 ug | Freq: Once | INTRAMUSCULAR | Status: AC
  Administered 2019-03-15: 1000 ug via INTRAMUSCULAR

## 2019-03-19 DIAGNOSIS — G4733 Obstructive sleep apnea (adult) (pediatric): Secondary | ICD-10-CM | POA: Diagnosis not present

## 2019-03-29 DIAGNOSIS — I10 Essential (primary) hypertension: Secondary | ICD-10-CM | POA: Diagnosis not present

## 2019-03-29 DIAGNOSIS — M1990 Unspecified osteoarthritis, unspecified site: Secondary | ICD-10-CM | POA: Diagnosis not present

## 2019-03-29 DIAGNOSIS — J45909 Unspecified asthma, uncomplicated: Secondary | ICD-10-CM | POA: Diagnosis not present

## 2019-03-29 DIAGNOSIS — R32 Unspecified urinary incontinence: Secondary | ICD-10-CM | POA: Diagnosis not present

## 2019-03-29 DIAGNOSIS — H547 Unspecified visual loss: Secondary | ICD-10-CM | POA: Diagnosis not present

## 2019-03-29 DIAGNOSIS — K219 Gastro-esophageal reflux disease without esophagitis: Secondary | ICD-10-CM | POA: Diagnosis not present

## 2019-03-29 DIAGNOSIS — G629 Polyneuropathy, unspecified: Secondary | ICD-10-CM | POA: Diagnosis not present

## 2019-03-29 DIAGNOSIS — G8929 Other chronic pain: Secondary | ICD-10-CM | POA: Diagnosis not present

## 2019-03-29 DIAGNOSIS — E538 Deficiency of other specified B group vitamins: Secondary | ICD-10-CM | POA: Diagnosis not present

## 2019-03-29 DIAGNOSIS — Z7951 Long term (current) use of inhaled steroids: Secondary | ICD-10-CM | POA: Diagnosis not present

## 2019-04-15 ENCOUNTER — Ambulatory Visit (INDEPENDENT_AMBULATORY_CARE_PROVIDER_SITE_OTHER): Payer: Medicare HMO | Admitting: Family Medicine

## 2019-04-15 ENCOUNTER — Encounter: Payer: Self-pay | Admitting: Family Medicine

## 2019-04-15 ENCOUNTER — Other Ambulatory Visit: Payer: Self-pay

## 2019-04-15 VITALS — BP 134/78 | HR 81 | Temp 96.9°F | Resp 16 | Ht 60.0 in | Wt 148.2 lb

## 2019-04-15 DIAGNOSIS — G2581 Restless legs syndrome: Secondary | ICD-10-CM

## 2019-04-15 DIAGNOSIS — Z9989 Dependence on other enabling machines and devices: Secondary | ICD-10-CM | POA: Diagnosis not present

## 2019-04-15 DIAGNOSIS — E559 Vitamin D deficiency, unspecified: Secondary | ICD-10-CM | POA: Diagnosis not present

## 2019-04-15 DIAGNOSIS — K219 Gastro-esophageal reflux disease without esophagitis: Secondary | ICD-10-CM

## 2019-04-15 DIAGNOSIS — G629 Polyneuropathy, unspecified: Secondary | ICD-10-CM

## 2019-04-15 DIAGNOSIS — E538 Deficiency of other specified B group vitamins: Secondary | ICD-10-CM | POA: Diagnosis not present

## 2019-04-15 DIAGNOSIS — G4733 Obstructive sleep apnea (adult) (pediatric): Secondary | ICD-10-CM | POA: Diagnosis not present

## 2019-04-15 DIAGNOSIS — R252 Cramp and spasm: Secondary | ICD-10-CM

## 2019-04-15 DIAGNOSIS — I10 Essential (primary) hypertension: Secondary | ICD-10-CM | POA: Diagnosis not present

## 2019-04-15 MED ORDER — OMEPRAZOLE 40 MG PO CPDR: 40.0000 mg | DELAYED_RELEASE_CAPSULE | Freq: Every day | ORAL | 1 refills | Status: DC

## 2019-04-15 MED ORDER — AMLODIPINE BESYLATE 2.5 MG PO TABS: 2.5000 mg | ORAL_TABLET | Freq: Every day | ORAL | 1 refills | Status: DC

## 2019-04-15 MED ORDER — OLMESARTAN MEDOXOMIL-HCTZ 20-12.5 MG PO TABS: 1.0000 | ORAL_TABLET | Freq: Every day | ORAL | 1 refills | Status: DC

## 2019-04-15 MED ORDER — CYANOCOBALAMIN 1000 MCG/ML IJ SOLN
1000.0000 ug | Freq: Once | INTRAMUSCULAR | Status: AC
  Administered 2019-04-15: 1000 ug via INTRAMUSCULAR

## 2019-04-15 NOTE — Progress Notes (Signed)
Name: Ruth [name]   MRN: [id number]    DOB: 06/03/1946   Date:04/15/2019       Progress Note  Subjective  Chief Complaint  Chief Complaint  Patient presents with  . Medication Refill    6 month F/U  . Hypertension    Headaches  . RLS    Requip made her feel funny and stopped taking  . Vitamin D Deficiency  . Gastroesophageal Reflux  . DDD    HPI  OSA: she started CPAP machine since May 2020, she states hard to sleep with it, but keeping it on for 6 hours, she takes it off early in am and has a good sleep after that. She has noticed increase in energy since  RLS: she states CPAP machine has helped with symptoms of RLS, cramps improved .   Polyneuropathy: seen by Dr. Manuella Ghazi , she states still has some nerve pains, but not as severe , she could not tolerate gabapentin and lyrica. She intermittent crawling sensation or pinching on her legs. Reassurance fo now   HTN: bp is at goal, denies dizziness, chest pain or palpitation. No side effects of medication   Dyslipidemia: LDL was at goal, HDL slightly low and discussed ways to improve levels  Asthma: under the care of Dr. Donneta Romberg, she states well controlled with medication, no wheezing or SOB but she has an occasional cough    Patient Active Problem List   Diagnosis Date Noted  . B12 deficiency 12/09/2018  . Dyslipidemia 11/08/2017  . Vitamin D deficiency 10/31/2016  . Osteopenia of left hip 10/31/2016  . 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  . BPV (benign positional vertigo) 04/17/2016  . RLS (restless legs syndrome) 04/17/2016  . GERD (gastroesophageal reflux disease) 03/11/2016  . History of hyperparathyroidism 03/11/2016  . History of gastric ulcer 03/11/2016  . Essential hypertension 04/24/2015  . Primary osteoarthritis involving multiple joints 04/24/2015  . Lumbago 04/24/2015  . Family history of colonic polyps     Past Surgical History:  Procedure Laterality Date  . CARPAL TUNNEL RELEASE Bilateral 2000  .  COLONOSCOPY  06/17/2012   A few two mm ulcers were found in the sigmoid colon, no bleeding present,bx taken-path reporet on the few tiny ulcers ,non specific  . COLONOSCOPY WITH PROPOFOL N/A 06/11/2017   Procedure: COLONOSCOPY WITH PROPOFOL;  Surgeon: Christene Lye, MD;  Location: [facility] ENDOSCOPY;  Service: Endoscopy;  Laterality: N/A;  . parathyroid surgery  2008  . 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 TUBAL LIGATION      Family History  Problem Relation Age of Onset  . Kidney disease Mother   . Ovarian cancer Mother   . Hypertension Father   . Stroke Father   . Lupus Sister     Social History   Socioeconomic History  . Marital status: Married    Spouse name: Trinh Sanjose   . Number of children: 1  . Years of education: Not on file  . Highest education level: Some college, no degree  Occupational History  . Not on file  Social Needs  . Financial resource strain: Not hard at all  . Food insecurity    Worry: Never true    Inability: Never true  . Transportation needs    Medical: No    Non-medical: No  Tobacco Use  . Smoking status: Never Smoker  . Smokeless tobacco: Never Used  Substance and Sexual Activity  . Alcohol use: Yes    Comment:  wine  . Drug use: No  . Sexual activity: Yes    Partners: Male    Birth control/protection: Post-menopausal  Lifestyle  . Physical activity    Days per week: 6 days    Minutes per session: 20 min  . Stress: Not at all  Relationships  . Social connections    Talks on phone: More than three times a week    Gets together: More than three times a week    Attends religious service: More than 4 times per year    Active member of club or organization: Yes    Attends meetings of clubs or organizations: More than 4 times per year    Relationship status: Married  . Intimate partner violence    Fear of current or ex partner: No    Emotionally abused: No    Physically abused: No     Forced sexual activity: No  Other Topics Concern  . Not on file  Social History Narrative   Married, retired, involved in church and has one grown son      Current Outpatient Medications:  .  acetaminophen (TYLENOL) 500 MG tablet, Take 1-2 tablets (500-1,000 mg total) by mouth every 8 (eight) hours as needed., Disp: 100 tablet, Rfl: 2 .  albuterol (VENTOLIN HFA) 108 (90 Base) MCG/ACT inhaler, Inhale 2 puffs into the lungs every 6 (six) hours as needed for wheezing or shortness of breath., Disp: 1 Inhaler, Rfl: 0 .  amLODipine (NORVASC) 2.5 MG tablet, Take 1 tablet (2.5 mg total) by mouth daily., Disp: 90 tablet, Rfl: 1 .  Ascorbic Acid (VITAMIN C) 1000 MG tablet, Take by mouth., Disp: , Rfl:  .  BREO ELLIPTA 200-25 MCG/INH AEPB, Take 1 puff by mouth daily., Disp: , Rfl:  .  Calcium-Vitamin D 600-200 MG-UNIT tablet, Take by mouth., Disp: , Rfl:  .  Cholecalciferol (VITAMIN D3) 1000 units CAPS, Take by mouth., Disp: , Rfl:  .  Cyanocobalamin (B-12) 2000 MCG TABS, Take 1 tablet by mouth daily., Disp: , Rfl:  .  L-Lysine 500 MG TABS, Take by mouth., Disp: , Rfl:  .  levocetirizine (XYZAL) 5 MG tablet, Take 1 tablet (5 mg total) by mouth every evening., Disp: 30 tablet, Rfl: 0 .  magnesium oxide (MAG-OX) 400 MG tablet, Take by mouth., Disp: , Rfl:  .  montelukast (SINGULAIR) 10 MG tablet, Take 1 tablet (10 mg total) by mouth at bedtime., Disp: 90 tablet, Rfl: 1 .  Nutritional Supplements (JOINT FORMULA PO), Take 2 tablets by mouth daily. , Disp: , Rfl:  .  olmesartan-hydrochlorothiazide (BENICAR HCT) 20-12.5 MG tablet, Take 1 tablet by mouth daily., Disp: 90 tablet, Rfl: 1 .  omeprazole (PRILOSEC) 40 MG capsule, Take 1 capsule (40 mg total) by mouth daily., Disp: 90 capsule, Rfl: 1 .  Potassium Gluconate ([employer] POTASSIUM GLUCONATE) 2 MEQ TABS, Take by mouth., Disp: , Rfl:   Current Facility-Administered Medications:  .  cyanocobalamin ((VITAMIN B-12)) injection 1,000 mcg, 1,000 mcg,  Intramuscular, Once, Alba Cory, MD  Allergies  Allergen Reactions  . Ace Inhibitors Cough  . Aspirin Nausea Only    I personally reviewed active problem list, medication list, allergies, family history, social history with the patient/caregiver today.   ROS  Constitutional: Negative for fever or weight change.  Respiratory: Negative for cough and shortness of breath.   Cardiovascular: Negative for chest pain or palpitations.  Gastrointestinal: Negative for abdominal pain, no bowel changes.  Musculoskeletal: Negative for gait problem or joint swelling.  Skin: Negative for rash.  Neurological: Negative for dizziness or headache.  No other specific complaints in a complete review of systems (except as listed in HPI above).   Objective  Vitals:   04/15/19 0919  BP: 134/78  Pulse: 81  Resp: 16  Temp: (!) 96.9 F (36.1 C)  TempSrc: Temporal  SpO2: 96%  Weight: 148 lb 3.2 oz (67.2 kg)  Height: 5' (1.524 m)    Body mass index is 28.94 kg/m.  Physical Exam  Constitutional: Patient appears well-developed and well-nourished. Overweight.  No distress.  HEENT: head atraumatic, normocephalic, pupils equal and reactive to light,  neck supple Cardiovascular: Normal rate, regular rhythm and normal heart sounds.  No murmur heard. No BLE edema. Pulmonary/Chest: Effort normal and breath sounds normal. No respiratory distress. Abdominal: Soft.  There is no tenderness. Psychiatric: Patient has a normal mood and affect. behavior is normal. Judgment and thought content normal.  PHQ2/9: Depression screen [facility] 2/9 04/15/2019 11/19/2018 09/14/2018 06/09/2018 11/05/2017  Decreased Interest 0 0 0 0 0  Down, Depressed, Hopeless 0 0 0 0 0  PHQ - 2 Score 0 0 0 0 0  Altered sleeping 0 - - - -  Tired, decreased energy 0 - - - -  Change in appetite 0 - - - -  Feeling bad or failure about yourself  0 - - - -  Trouble concentrating 0 - - - -  Moving slowly or fidgety/restless 0 - - - -   Suicidal thoughts 0 - - - -  PHQ-9 Score 0 - - - -  Difficult doing work/chores Not difficult at all - - - -    phq 9 is negative   Fall Risk: Fall Risk  11/19/2018 09/14/2018 06/09/2018 03/05/2018 11/05/2017  Falls in the past year? 0 0 No No No  Number falls in past yr: 0 0 - - -  Injury with Fall? 0 0 - - -  Follow up Falls prevention discussed - - - -     Assessment & Plan  1. Essential hypertension  - olmesartan-hydrochlorothiazide (BENICAR HCT) 20-12.5 MG tablet; Take 1 tablet by mouth daily.  Dispense: 90 tablet; Refill: 1 - amLODipine (NORVASC) 2.5 MG tablet; Take 1 tablet (2.5 mg total) by mouth daily.  Dispense: 90 tablet; Refill: 1  2. B12 deficiency  - cyanocobalamin ((VITAMIN B-12)) injection 1,000 mcg  3. Gastroesophageal reflux disease without esophagitis  - omeprazole (PRILOSEC) 40 MG capsule; Take 1 capsule (40 mg total) by mouth daily.  Dispense: 90 capsule; Refill: 1  4. Vitamin D deficiency   5. Peripheral polyneuropathy   6. RLS (restless legs syndrome)  Stopped medication   7. Leg cramp   8. OSA on CPAP  Compliant

## 2019-04-18 DIAGNOSIS — G4733 Obstructive sleep apnea (adult) (pediatric): Secondary | ICD-10-CM | POA: Diagnosis not present

## 2019-04-29 DIAGNOSIS — R05 Cough: Secondary | ICD-10-CM | POA: Diagnosis not present

## 2019-04-29 DIAGNOSIS — K219 Gastro-esophageal reflux disease without esophagitis: Secondary | ICD-10-CM | POA: Diagnosis not present

## 2019-04-29 DIAGNOSIS — J300 Vasomotor rhinitis: Secondary | ICD-10-CM | POA: Diagnosis not present

## 2019-05-04 DIAGNOSIS — Z20828 Contact with and (suspected) exposure to other viral communicable diseases: Secondary | ICD-10-CM | POA: Diagnosis not present

## 2019-05-19 DIAGNOSIS — G4733 Obstructive sleep apnea (adult) (pediatric): Secondary | ICD-10-CM | POA: Diagnosis not present

## 2019-05-20 ENCOUNTER — Other Ambulatory Visit: Payer: Self-pay

## 2019-05-20 ENCOUNTER — Ambulatory Visit (INDEPENDENT_AMBULATORY_CARE_PROVIDER_SITE_OTHER): Payer: Medicare HMO

## 2019-05-20 DIAGNOSIS — E538 Deficiency of other specified B group vitamins: Secondary | ICD-10-CM | POA: Diagnosis not present

## 2019-05-20 MED ORDER — CYANOCOBALAMIN 1000 MCG/ML IJ SOLN
1000.0000 ug | Freq: Once | INTRAMUSCULAR | Status: AC
  Administered 2019-05-20: 1000 ug via INTRAMUSCULAR

## 2019-05-25 ENCOUNTER — Telehealth: Payer: Self-pay

## 2019-05-25 DIAGNOSIS — M85852 Other specified disorders of bone density and structure, left thigh: Secondary | ICD-10-CM

## 2019-05-25 NOTE — Telephone Encounter (Signed)
Copied from [place] [id number]. Topic: General - Inquiry >> May 25, 2019 12:56 PM Richardo Priest, NT wrote: Reason for CRM: Patient called in stating she is needing clarification in regards to her mammogram she is supposed to be having. Patient states she believed she was supposed to have it in [place] and not [place]. Patient also is wondering if she is needing to have a bone density test with this as well. Please advise and Call back is [phone].

## 2019-05-25 NOTE — Telephone Encounter (Signed)
Copied from [place] [id number]. Topic: General - Inquiry >> May 25, 2019 12:56 PM Richardo Priest, NT wrote: Reason for CRM: Patient called in stating she is needing clarification in regards to her mammogram she is supposed to be having. Patient states she believed she was supposed to have it in [place] and not [place]. Patient also is wondering if she is needing to have a bone density test with this as well. Please advise and Call back is [phone].  An order for a DEXA scan was placed and she has been scheduled for 06/01/2019 @ 9:40am at the [facility] location.   Patient informed.

## 2019-05-27 ENCOUNTER — Ambulatory Visit
Admission: RE | Admit: 2019-05-27 | Discharge: 2019-05-27 | Disposition: A | Discharge status: Home / Self Care | Payer: Medicare HMO | Source: Ambulatory Visit | Attending: Family Medicine | Admitting: Family Medicine

## 2019-05-27 ENCOUNTER — Other Ambulatory Visit: Payer: Self-pay

## 2019-05-27 DIAGNOSIS — Z1231 Encounter for screening mammogram for malignant neoplasm of breast: Secondary | ICD-10-CM | POA: Insufficient documentation

## 2019-06-01 ENCOUNTER — Ambulatory Visit
Admission: RE | Admit: 2019-06-01 | Discharge: 2019-06-01 | Disposition: A | Discharge status: Home / Self Care | Payer: Medicare HMO | Source: Ambulatory Visit | Attending: Family Medicine | Admitting: Family Medicine

## 2019-06-01 DIAGNOSIS — M85852 Other specified disorders of bone density and structure, left thigh: Secondary | ICD-10-CM

## 2019-06-01 DIAGNOSIS — M8589 Other specified disorders of bone density and structure, multiple sites: Secondary | ICD-10-CM | POA: Diagnosis not present

## 2019-06-01 DIAGNOSIS — Z780 Asymptomatic menopausal state: Secondary | ICD-10-CM | POA: Diagnosis not present

## 2019-06-02 DIAGNOSIS — G4733 Obstructive sleep apnea (adult) (pediatric): Secondary | ICD-10-CM | POA: Diagnosis not present

## 2019-06-03 ENCOUNTER — Telehealth: Payer: Self-pay

## 2019-06-03 NOTE — Telephone Encounter (Signed)
Copied from [place] [id number]. Topic: General - Other >> Jun 03, 2019  9:53 AM Celene Kras A wrote: Reason for CRM: Pt calling and is requesting to have her results from her mammogram and bone density. Please advise.

## 2019-06-16 DIAGNOSIS — R69 Illness, unspecified: Secondary | ICD-10-CM | POA: Diagnosis not present

## 2019-06-19 DIAGNOSIS — G4733 Obstructive sleep apnea (adult) (pediatric): Secondary | ICD-10-CM | POA: Diagnosis not present

## 2019-06-24 ENCOUNTER — Other Ambulatory Visit: Payer: Self-pay

## 2019-06-24 ENCOUNTER — Ambulatory Visit (INDEPENDENT_AMBULATORY_CARE_PROVIDER_SITE_OTHER): Payer: Medicare HMO

## 2019-06-24 DIAGNOSIS — E538 Deficiency of other specified B group vitamins: Secondary | ICD-10-CM | POA: Diagnosis not present

## 2019-06-24 MED ORDER — CYANOCOBALAMIN 1000 MCG/ML IJ SOLN
1000.0000 ug | Freq: Once | INTRAMUSCULAR | Status: AC
  Administered 2019-06-24: 1000 ug via INTRAMUSCULAR

## 2019-06-24 NOTE — Progress Notes (Signed)
Patient came in for her B-12 injection. She tolerated it well. NKDA.   

## 2019-07-02 DIAGNOSIS — G4733 Obstructive sleep apnea (adult) (pediatric): Secondary | ICD-10-CM | POA: Diagnosis not present

## 2019-07-19 DIAGNOSIS — G4733 Obstructive sleep apnea (adult) (pediatric): Secondary | ICD-10-CM | POA: Diagnosis not present

## 2019-07-26 ENCOUNTER — Other Ambulatory Visit: Payer: Self-pay

## 2019-07-26 ENCOUNTER — Ambulatory Visit (INDEPENDENT_AMBULATORY_CARE_PROVIDER_SITE_OTHER): Payer: Medicare HMO

## 2019-07-26 DIAGNOSIS — E538 Deficiency of other specified B group vitamins: Secondary | ICD-10-CM

## 2019-07-26 MED ORDER — CYANOCOBALAMIN 1000 MCG/ML IJ SOLN
1000.0000 ug | INTRAMUSCULAR | Status: DC
  Administered 2019-07-26 – 2020-12-25 (×9): 1000 ug via INTRAMUSCULAR

## 2019-07-26 NOTE — Progress Notes (Signed)
Patient came in for her B-12 injection. She tolerated it well. NKDA.   

## 2019-08-02 DIAGNOSIS — G4733 Obstructive sleep apnea (adult) (pediatric): Secondary | ICD-10-CM | POA: Diagnosis not present

## 2019-08-19 DIAGNOSIS — G4733 Obstructive sleep apnea (adult) (pediatric): Secondary | ICD-10-CM | POA: Diagnosis not present

## 2019-08-27 ENCOUNTER — Other Ambulatory Visit: Payer: Self-pay

## 2019-08-27 ENCOUNTER — Ambulatory Visit (INDEPENDENT_AMBULATORY_CARE_PROVIDER_SITE_OTHER): Payer: Medicare HMO

## 2019-08-27 DIAGNOSIS — E538 Deficiency of other specified B group vitamins: Secondary | ICD-10-CM | POA: Diagnosis not present

## 2019-08-27 NOTE — Progress Notes (Signed)
Patient came in for her B-12 injection. She tolerated it well. NKDA.   

## 2019-08-31 DIAGNOSIS — G4733 Obstructive sleep apnea (adult) (pediatric): Secondary | ICD-10-CM | POA: Diagnosis not present

## 2019-09-13 ENCOUNTER — Ambulatory Visit: Payer: Medicare HMO | Attending: Internal Medicine

## 2019-09-13 DIAGNOSIS — Z20822 Contact with and (suspected) exposure to covid-19: Secondary | ICD-10-CM

## 2019-09-13 DIAGNOSIS — Z20828 Contact with and (suspected) exposure to other viral communicable diseases: Secondary | ICD-10-CM | POA: Diagnosis not present

## 2019-09-14 LAB — NOVEL CORONAVIRUS, NAA: SARS-CoV-2, NAA: NOT DETECTED

## 2019-09-18 DIAGNOSIS — G4733 Obstructive sleep apnea (adult) (pediatric): Secondary | ICD-10-CM | POA: Diagnosis not present

## 2019-09-27 ENCOUNTER — Ambulatory Visit (INDEPENDENT_AMBULATORY_CARE_PROVIDER_SITE_OTHER): Payer: Medicare HMO

## 2019-09-27 ENCOUNTER — Other Ambulatory Visit: Payer: Self-pay

## 2019-09-27 DIAGNOSIS — E538 Deficiency of other specified B group vitamins: Secondary | ICD-10-CM

## 2019-09-27 MED ORDER — CYANOCOBALAMIN 1000 MCG/ML IJ SOLN
1000.0000 ug | Freq: Once | INTRAMUSCULAR | Status: AC
  Administered 2019-09-27: 1000 ug via INTRAMUSCULAR

## 2019-10-01 DIAGNOSIS — G4733 Obstructive sleep apnea (adult) (pediatric): Secondary | ICD-10-CM | POA: Diagnosis not present

## 2019-10-18 ENCOUNTER — Other Ambulatory Visit: Payer: Self-pay

## 2019-10-18 ENCOUNTER — Ambulatory Visit (INDEPENDENT_AMBULATORY_CARE_PROVIDER_SITE_OTHER): Payer: Medicare HMO | Admitting: Family Medicine

## 2019-10-18 ENCOUNTER — Encounter: Payer: Self-pay | Admitting: Family Medicine

## 2019-10-18 VITALS — BP 130/70 | HR 95 | Temp 97.3°F | Resp 16 | Ht 60.0 in | Wt 150.9 lb

## 2019-10-18 DIAGNOSIS — E559 Vitamin D deficiency, unspecified: Secondary | ICD-10-CM | POA: Diagnosis not present

## 2019-10-18 DIAGNOSIS — Z9989 Dependence on other enabling machines and devices: Secondary | ICD-10-CM

## 2019-10-18 DIAGNOSIS — E785 Hyperlipidemia, unspecified: Secondary | ICD-10-CM | POA: Diagnosis not present

## 2019-10-18 DIAGNOSIS — E538 Deficiency of other specified B group vitamins: Secondary | ICD-10-CM | POA: Diagnosis not present

## 2019-10-18 DIAGNOSIS — I10 Essential (primary) hypertension: Secondary | ICD-10-CM | POA: Diagnosis not present

## 2019-10-18 DIAGNOSIS — G4733 Obstructive sleep apnea (adult) (pediatric): Secondary | ICD-10-CM | POA: Diagnosis not present

## 2019-10-18 DIAGNOSIS — M85852 Other specified disorders of bone density and structure, left thigh: Secondary | ICD-10-CM

## 2019-10-18 DIAGNOSIS — G2581 Restless legs syndrome: Secondary | ICD-10-CM

## 2019-10-18 DIAGNOSIS — M5136 Other intervertebral disc degeneration, lumbar region: Secondary | ICD-10-CM | POA: Diagnosis not present

## 2019-10-18 DIAGNOSIS — K219 Gastro-esophageal reflux disease without esophagitis: Secondary | ICD-10-CM

## 2019-10-18 DIAGNOSIS — G629 Polyneuropathy, unspecified: Secondary | ICD-10-CM | POA: Diagnosis not present

## 2019-10-18 LAB — COMPLETE METABOLIC PANEL WITH GFR
AG Ratio: 1.5 (calc) (ref 1.0–2.5)
ALT: 21 U/L (ref 6–29)
AST: 22 U/L (ref 10–35)
Albumin: 4.3 g/dL (ref 3.6–5.1)
Alkaline phosphatase (APISO): 45 U/L (ref 37–153)
BUN: 14 mg/dL (ref 7–25)
CO2: 28 mmol/L (ref 20–32)
Calcium: 10.3 mg/dL (ref 8.6–10.4)
Chloride: 105 mmol/L (ref 98–110)
Creat: 0.73 mg/dL (ref 0.60–0.93)
GFR, Est African American: 95 mL/min/{1.73_m2} (ref >=60)
GFR, Est Non African American: 82 mL/min/{1.73_m2} (ref >=60)
Globulin: 2.8 g/dL (calc) (ref 1.9–3.7)
Glucose, Bld: 82 mg/dL (ref 65–99)
Potassium: 3.7 mmol/L (ref 3.5–5.3)
Sodium: 141 mmol/L (ref 135–146)
Total Bilirubin: 0.4 mg/dL (ref 0.2–1.2)
Total Protein: 7.1 g/dL (ref 6.1–8.1)

## 2019-10-18 LAB — CBC WITH DIFFERENTIAL/PLATELET
Absolute Monocytes: 647 cells/uL (ref 200–950)
Basophils Absolute: 39 cells/uL (ref 0–200)
Basophils Relative: 0.5 %
Eosinophils Absolute: 77 cells/uL (ref 15–500)
Eosinophils Relative: 1 %
HCT: 40.3 % (ref 35.0–45.0)
Hemoglobin: 13.7 g/dL (ref 11.7–15.5)
Lymphs Abs: 2241 cells/uL (ref 850–3900)
MCH: 31.3 pg (ref 27.0–33.0)
MCHC: 34 g/dL (ref 32.0–36.0)
MCV: 92 fL (ref 80.0–100.0)
MPV: 9.5 fL (ref 7.5–12.5)
Monocytes Relative: 8.4 %
Neutro Abs: 4697 cells/uL (ref 1500–7800)
Neutrophils Relative %: 61 %
Platelets: 281 10*3/uL (ref 140–400)
RBC: 4.38 10*6/uL (ref 3.80–5.10)
RDW: 11.8 % (ref 11.0–15.0)
Total Lymphocyte: 29.1 %
WBC: 7.7 10*3/uL (ref 3.8–10.8)

## 2019-10-18 LAB — LIPID PANEL
Cholesterol: 155 mg/dL (ref <200)
HDL: 43 mg/dL — ABNORMAL LOW (ref >=50)
LDL Cholesterol (Calc): 85 mg/dL (calc)
Non-HDL Cholesterol (Calc): 112 mg/dL (calc) (ref <130)
Total CHOL/HDL Ratio: 3.6 (calc) (ref <5.0)
Triglycerides: 177 mg/dL — ABNORMAL HIGH (ref <150)

## 2019-10-18 LAB — VITAMIN B12: Vitamin B-12: 1779 pg/mL — ABNORMAL HIGH (ref 200–1100)

## 2019-10-18 LAB — VITAMIN D 25 HYDROXY (VIT D DEFICIENCY, FRACTURES): Vit D, 25-Hydroxy: 38 ng/mL (ref 30–100)

## 2019-10-18 MED ORDER — ROSUVASTATIN CALCIUM 5 MG PO CPSP: 1.0000 | ORAL_CAPSULE | Freq: Every evening | ORAL | 1 refills | Status: DC

## 2019-10-18 MED ORDER — AMLODIPINE BESYLATE 2.5 MG PO TABS: 2.5000 mg | ORAL_TABLET | Freq: Every day | ORAL | 1 refills | Status: DC

## 2019-10-18 MED ORDER — OMEPRAZOLE 40 MG PO CPDR: 40.0000 mg | DELAYED_RELEASE_CAPSULE | Freq: Every day | ORAL | 1 refills | Status: DC

## 2019-10-18 MED ORDER — OLMESARTAN MEDOXOMIL-HCTZ 20-12.5 MG PO TABS: 1.0000 | ORAL_TABLET | Freq: Every day | ORAL | 1 refills | Status: DC

## 2019-10-18 NOTE — Progress Notes (Signed)
Name: EARLY ORD   MRN: [id number]    DOB: July 10, 1946   Date:10/18/2019       Progress Note  Subjective  Chief Complaint  Chief Complaint  Patient presents with  . Medication Refill  . Hypertension  . RLS  . Polyneuropathy  . Sleep Apnea  . Asthma  . Dyslipidemia    HPI  OSA: she started CPAP machine in  May 2020, she was keeping it on for 6 hours, she stopped using it months ago but she is willing to resume it again   GERD: tried to stop omeprazole but unable to tolerated symptoms without medication, but controlled with medication, non heartburn or indigestion  RLS: she states CPAP machine has helped with symptoms of RLS, cramps improved . However she stopped using it on her own because she states she was getting up because of neuropathy, she is snoring again, advised to resume CPAP   Polyneuropathy: seen by Dr. Sherryll Burger in the past , she states still has some nerve pains, but not as severe , she could not tolerate gabapentin and lyrica. She intermittent crawling sensation or pinching on her legs. She states symptoms are stable it wakes her up during the night, discussed Duloxetine but she wants to hold off on other medications at this time  HTN: bp is at goal, denies dizziness, chest pain or palpitation. No side effects of medication  Continue Benicar  Dyslipidemia: LDL was at goal, HDL was low, we will recheck labs, not on statin therapy at this time. Discussed medication and she is willing to try it   The 10-year ASCVD risk score Denman George DC Montez Hageman., et al., 2013) is: 10.3%   Values used to calculate the score:     Age: 74 years     Sex: Female     Is Non-Hispanic African American: Yes     Diabetic: No     Tobacco smoker: No     Systolic Blood Pressure: 130 mmHg     Is BP treated: Yes     HDL Cholesterol: 48 mg/dL     Total Cholesterol: 151 mg/dL  Asthma: under the care of Dr. Glasco Callas, she states well controlled with medication, no wheezing or SOB but she has an occasional  cough. She has Breo and uses almost every day, and a rescue inhaler at home but has not used it in a while   Patient Active Problem List   Diagnosis Date Noted  . OSA on CPAP 04/15/2019  . B12 deficiency 12/09/2018  . Dyslipidemia 11/08/2017  . Vitamin D deficiency 10/31/2016  . Osteopenia of left hip 10/31/2016  . 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  . BPV (benign positional vertigo) 04/17/2016  . RLS (restless legs syndrome) 04/17/2016  . GERD (gastroesophageal reflux disease) 03/11/2016  . History of hyperparathyroidism 03/11/2016  . History of gastric ulcer 03/11/2016  . Essential hypertension 04/24/2015  . Primary osteoarthritis involving multiple joints 04/24/2015  . Lumbago 04/24/2015  . Family history of colonic polyps     Past Surgical History:  Procedure Laterality Date  . CARPAL TUNNEL RELEASE Bilateral 2000  . COLONOSCOPY  06/17/2012   A few two mm ulcers were found in the sigmoid colon, no bleeding present,bx taken-path reporet on the few tiny ulcers ,non specific  . COLONOSCOPY WITH PROPOFOL N/A 06/11/2017   Procedure: COLONOSCOPY WITH PROPOFOL;  Surgeon: Kieth Brightly, MD;  Location: [facility] ENDOSCOPY;  Service: Endoscopy;  Laterality: N/A;  . parathyroid surgery  2008  . TEMPOROMANDIBULAR JOINT  ARTHROPLASTY Left 1984   1989,1990-bilateral  . TUBAL LIGATION      Family History  Adopted: Yes  Problem Relation Age of Onset  . Kidney disease Mother   . Ovarian cancer Mother   . Hypertension Father   . Stroke Father   . Lupus Sister   . Breast cancer Maternal Grandmother   . Breast cancer Cousin        pat cousins     Current Outpatient Medications:  .  acetaminophen (TYLENOL) 500 MG tablet, Take 1-2 tablets (500-1,000 mg total) by mouth every 8 (eight) hours as needed., Disp: 100 tablet, Rfl: 2 .  albuterol (VENTOLIN HFA) 108 (90 Base) MCG/ACT inhaler, Inhale 2 puffs into the lungs every 6 (six) hours as needed for wheezing or shortness of breath.,  Disp: 1 Inhaler, Rfl: 0 .  amLODipine (NORVASC) 2.5 MG tablet, Take 1 tablet (2.5 mg total) by mouth daily., Disp: 90 tablet, Rfl: 1 .  Ascorbic Acid (VITAMIN C) 1000 MG tablet, Take by mouth., Disp: , Rfl:  .  BREO ELLIPTA 200-25 MCG/INH AEPB, Take 1 puff by mouth daily., Disp: , Rfl:  .  Calcium-Vitamin D 600-200 MG-UNIT tablet, Take by mouth., Disp: , Rfl:  .  Cholecalciferol (VITAMIN D3) 1000 units CAPS, Take by mouth., Disp: , Rfl:  .  Cyanocobalamin (B-12) 2000 MCG TABS, Take 1 tablet by mouth daily., Disp: , Rfl:  .  L-Lysine 500 MG TABS, Take by mouth., Disp: , Rfl:  .  levocetirizine (XYZAL) 5 MG tablet, Take 1 tablet (5 mg total) by mouth every evening., Disp: 30 tablet, Rfl: 0 .  magnesium oxide (MAG-OX) 400 MG tablet, Take by mouth., Disp: , Rfl:  .  montelukast (SINGULAIR) 10 MG tablet, Take 1 tablet (10 mg total) by mouth at bedtime., Disp: 90 tablet, Rfl: 1 .  Nutritional Supplements (JOINT FORMULA PO), Take 2 tablets by mouth daily. , Disp: , Rfl:  .  olmesartan-hydrochlorothiazide (BENICAR HCT) 20-12.5 MG tablet, Take 1 tablet by mouth daily., Disp: 90 tablet, Rfl: 1 .  omeprazole (PRILOSEC) 40 MG capsule, Take 1 capsule (40 mg total) by mouth daily., Disp: 90 capsule, Rfl: 1 .  Potassium Gluconate 2.5 MEQ TABS, Take by mouth., Disp: , Rfl:   Current Facility-Administered Medications:  .  cyanocobalamin ((VITAMIN B-12)) injection 1,000 mcg, 1,000 mcg, Intramuscular, Q30 days, Alba Cory, MD, 1,000 mcg at 08/27/19 0935  Allergies  Allergen Reactions  . Ace Inhibitors Cough  . Aspirin Nausea Only    I personally reviewed active problem list, medication list, allergies, family history, social history with the patient/caregiver today.   ROS  Constitutional: Negative for fever or weight change.  Respiratory: Negative for cough and shortness of breath.   Cardiovascular: Negative for chest pain or palpitations.  Gastrointestinal: Negative for abdominal pain, no bowel  changes.  Musculoskeletal: Negative for gait problem or joint swelling.  Skin: Negative for rash.  Neurological: Negative for dizziness or headache.  No other specific complaints in a complete review of systems (except as listed in HPI above).  Objective  Vitals:   10/18/19 0929  BP: 130/70  Pulse: 95  Resp: 16  Temp: (!) 97.3 F (36.3 C)  TempSrc: Temporal  SpO2: 96%  Weight: 150 lb 14.4 oz (68.4 kg)  Height: 5' (1.524 m)    Body mass index is 29.47 kg/m.  Physical Exam  Constitutional: Patient appears well-developed and well-nourished. Overweight No distress.  HEENT: head atraumatic, normocephalic, pupils equal and reactive to light  Cardiovascular: Normal rate, regular rhythm and normal heart sounds.  No murmur heard. No BLE edema. Pulmonary/Chest: Effort normal and breath sounds normal. No respiratory distress. Abdominal: Soft.  There is no tenderness. Psychiatric: Patient has a normal mood and affect. behavior is normal. Judgment and thought content normal.  Recent Results (from the past 2160 hour(s))  Novel Coronavirus, NAA ([facility])     Status: None   Collection Time: 09/13/19  8:31 AM   Specimen: Nasopharyngeal(NP) swabs in vial transport medium   NASOPHARYNGE  TESTING  Result Value Ref Range   SARS-CoV-2, NAA Not Detected Not Detected    Comment: This nucleic acid amplification test was developed and its performance characteristics determined by [employer]. Nucleic acid amplification tests include PCR and TMA. This test has not been FDA cleared or approved. This test has been authorized by FDA under an Emergency Use Authorization (EUA). This test is only authorized for the duration of time the declaration that circumstances exist justifying the authorization of the emergency use of in vitro diagnostic tests for detection of SARS-CoV-2 virus and/or diagnosis of COVID-19 infection under section 564(b)(1) of the Act, 21 U.S.C. 734LPF-7(T) (1), unless  the authorization is terminated or revoked sooner. When diagnostic testing is negative, the possibility of a false negative result should be considered in the context of a patient's recent exposures and the presence of clinical signs and symptoms consistent with COVID-19. An individual without symptoms of COVID-19 and who is not shedding SARS-CoV-2 virus would  expect to have a negative (not detected) result in this assay.     PHQ2/9: Depression screen [facility] 2/9 10/18/2019 04/15/2019 11/19/2018 09/14/2018 06/09/2018  Decreased Interest 0 0 0 0 0  Down, Depressed, Hopeless 0 0 0 0 0  PHQ - 2 Score 0 0 0 0 0  Altered sleeping 0 0 - - -  Tired, decreased energy 0 0 - - -  Change in appetite 0 0 - - -  Feeling bad or failure about yourself  0 0 - - -  Trouble concentrating 0 0 - - -  Moving slowly or fidgety/restless 0 0 - - -  Suicidal thoughts 0 0 - - -  PHQ-9 Score 0 0 - - -  Difficult doing work/chores Not difficult at all Not difficult at all - - -    phq 9 is negative   Fall Risk: Fall Risk  10/18/2019 11/19/2018 09/14/2018 06/09/2018 03/05/2018  Falls in the past year? 0 0 0 No No  Number falls in past yr: 0 0 0 - -  Injury with Fall? 0 0 0 - -  Follow up - Falls prevention discussed - - -     Functional Status Survey: Is the patient deaf or have difficulty hearing?: No Does the patient have difficulty seeing, even when wearing glasses/contacts?: Yes Does the patient have difficulty concentrating, remembering, or making decisions?: No Does the patient have difficulty walking or climbing stairs?: No Does the patient have difficulty dressing or bathing?: No Does the patient have difficulty doing errands alone such as visiting a doctor's office or shopping?: No    Assessment & Plan  1. Gastroesophageal reflux disease without esophagitis  - omeprazole (PRILOSEC) 40 MG capsule; Take 1 capsule (40 mg total) by mouth daily.  Dispense: 90 capsule; Refill: 1  2. Vitamin D  deficiency  Continue supplementation   3. Essential hypertension  - olmesartan-hydrochlorothiazide (BENICAR HCT) 20-12.5 MG tablet; Take 1 tablet by mouth daily.  Dispense: 90 tablet; Refill: 1 -  amLODipine (NORVASC) 2.5 MG tablet; Take 1 tablet (2.5 mg total) by mouth daily.  Dispense: 90 tablet; Refill: 1  4. Peripheral polyneuropathy  Stable  5. OSA on CPAP  Resume CPAP   6. RLS (restless legs syndrome)   7. Osteopenia of left hip  On vitamin D   8. B12 deficiency  Getting B12 supplementation   9. Dyslipidemia  Discussed possible side effects  - Rosuvastatin Calcium 5 MG CPSP; Take 1 tablet by mouth every evening.  Dispense: 90 capsule; Refill: 1  10. DDD (degenerative disc disease), lumbar

## 2019-10-19 DIAGNOSIS — G4733 Obstructive sleep apnea (adult) (pediatric): Secondary | ICD-10-CM | POA: Diagnosis not present

## 2019-10-28 ENCOUNTER — Other Ambulatory Visit: Payer: Self-pay

## 2019-10-28 ENCOUNTER — Ambulatory Visit (INDEPENDENT_AMBULATORY_CARE_PROVIDER_SITE_OTHER): Payer: Medicare HMO

## 2019-10-28 DIAGNOSIS — E538 Deficiency of other specified B group vitamins: Secondary | ICD-10-CM | POA: Diagnosis not present

## 2019-10-28 MED ORDER — CYANOCOBALAMIN 1000 MCG/ML IJ SOLN
1000.0000 ug | Freq: Once | INTRAMUSCULAR | Status: AC
  Administered 2019-10-28: 1000 ug via INTRAMUSCULAR

## 2019-11-01 DIAGNOSIS — G4733 Obstructive sleep apnea (adult) (pediatric): Secondary | ICD-10-CM | POA: Diagnosis not present

## 2019-11-02 DIAGNOSIS — K219 Gastro-esophageal reflux disease without esophagitis: Secondary | ICD-10-CM | POA: Diagnosis not present

## 2019-11-02 DIAGNOSIS — J300 Vasomotor rhinitis: Secondary | ICD-10-CM | POA: Diagnosis not present

## 2019-11-02 DIAGNOSIS — R05 Cough: Secondary | ICD-10-CM | POA: Diagnosis not present

## 2019-11-08 ENCOUNTER — Encounter: Payer: Self-pay | Admitting: Family Medicine

## 2019-11-19 DIAGNOSIS — G4733 Obstructive sleep apnea (adult) (pediatric): Secondary | ICD-10-CM | POA: Diagnosis not present

## 2019-11-25 ENCOUNTER — Ambulatory Visit (INDEPENDENT_AMBULATORY_CARE_PROVIDER_SITE_OTHER): Payer: Medicare HMO

## 2019-11-25 ENCOUNTER — Other Ambulatory Visit: Payer: Self-pay

## 2019-11-25 VITALS — BP 122/72 | HR 85 | Temp 97.3°F | Resp 16 | Ht 60.0 in | Wt 150.9 lb

## 2019-11-25 DIAGNOSIS — Z0000 Encounter for general adult medical examination without abnormal findings: Secondary | ICD-10-CM

## 2019-11-25 DIAGNOSIS — E538 Deficiency of other specified B group vitamins: Secondary | ICD-10-CM | POA: Diagnosis not present

## 2019-11-25 MED ORDER — CYANOCOBALAMIN 1000 MCG/ML IJ SOLN
1000.0000 ug | Freq: Once | INTRAMUSCULAR | Status: AC
  Administered 2019-11-25: 1000 ug via INTRAMUSCULAR

## 2019-11-25 NOTE — Progress Notes (Signed)
Subjective:   Ruth [name] is a 74 y.o. female who presents for Medicare Annual (Subsequent) preventive examination.  Review of Systems:   Cardiac Risk Factors include: advanced age (>43men, >76 women);hypertension;dyslipidemia     Objective:     Vitals: BP 122/72 (BP Location: Left Arm, Patient Position: Sitting, Cuff Size: Normal)   Pulse 85   Temp (!) 97.3 F (36.3 C) (Temporal)   Resp 16   Ht 5' (1.524 m)   Wt 150 lb 14.4 oz (68.4 kg)   SpO2 98%   BMI 29.47 kg/m   Body mass index is 29.47 kg/m.  Advanced Directives 11/25/2019 11/19/2018 06/23/2017 06/18/2017 06/11/2017 03/05/2017 10/31/2016  Does Patient Have a Medical Advance Directive? No No Yes Yes Yes Yes No  Type of Advance Directive - - - Living will Living will Healthcare Power of [place];Living will -  Copy of Healthcare Power of Attorney in Chart? - - - - - No - copy requested -  Would patient like information on creating a medical advance directive? Yes (MAU/Ambulatory/Procedural Areas - Information given) Yes (MAU/Ambulatory/Procedural Areas - Information given) - - - - -    Tobacco Social History   Tobacco Use  Smoking Status Never Smoker  Smokeless Tobacco Never Used     Counseling given: Not Answered   Clinical Intake:  Pre-visit preparation completed: Yes  Pain : No/denies pain     BMI - recorded: 29.47 Nutritional Status: BMI 25 -29 Overweight Nutritional Risks: None Diabetes: No  How often do you need to have someone help you when you read instructions, pamphlets, or other written materials from your doctor or pharmacy?: 1 - Never  Interpreter Needed?: No  Information entered by :: Reather Littler LPN  Past Medical History:  Diagnosis Date  . Allergy    mycins; cause diarrhea  . Arthritis    since 2007  . Asthma   . Breast screening, unspecified   . Family history of colonic polyps   . GERD (gastroesophageal reflux disease)   . History of cystitis 1987  . Hypertension    since  2005  . 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 Restless leg syndrome   . Ulcer 1966   Past Surgical History:  Procedure Laterality Date  . CARPAL TUNNEL RELEASE Bilateral 2000  . COLONOSCOPY  06/17/2012   A few two mm ulcers were found in the sigmoid colon, no bleeding present,bx taken-path reporet on the few tiny ulcers ,non specific  . COLONOSCOPY WITH PROPOFOL N/A 06/11/2017   Procedure: COLONOSCOPY WITH PROPOFOL;  Surgeon: Kieth Brightly, MD;  Location: [facility] ENDOSCOPY;  Service: Endoscopy;  Laterality: N/A;  . parathyroid surgery  2008  . 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 TUBAL LIGATION     Family History  Adopted: Yes  Problem Relation Age of Onset  . Kidney disease Mother   . Ovarian cancer Mother   . Hypertension Father   . Stroke Father   . Lupus Sister   . Breast cancer Maternal Grandmother   . Breast cancer Cousin        pat cousins   Social History   Socioeconomic History  . Marital status: Married    Spouse name: Orena Cavazos   . Number of children: 1  . Years of education: Not on file  . Highest education level: Some college, no degree  Occupational History  . Not on file  Tobacco Use  . Smoking status: Never Smoker  .  Smokeless tobacco: Never Used  Substance and Sexual Activity  . Alcohol use: Yes    Comment: wine  . Drug use: No  . Sexual activity: Yes    Partners: Male    Birth control/protection: Post-menopausal  Other Topics Concern  . Not on file  Social History Narrative   Married, retired, involved in church and has one grown son    Social Determinants of Radio broadcast assistant Strain: Low Risk   . Difficulty of Paying Living Expenses: Not hard at all  Food Insecurity: No Food Insecurity  . Worried About Charity fundraiser in the Last Year: Never true  . Ran Out of Food in the Last Year: Never true  Transportation Needs: No Transportation Needs  . Lack of Transportation  (Medical): No  . Lack of Transportation (Non-Medical): No  Physical Activity: Inactive  . Days of Exercise per Week: 0 days  . Minutes of Exercise per Session: 0 min  Stress: No Stress Concern Present  . Feeling of Stress : Not at all  Social Connections: Not Isolated  . Frequency of Communication with Friends and Family: More than three times a week  . Frequency of Social Gatherings with Friends and Family: More than three times a week  . Attends Religious Services: More than 4 times per year  . Active Member of Clubs or Organizations: Yes  . Attends Archivist Meetings: More than 4 times per year  . Marital Status: Married    Outpatient Encounter Medications as of 11/25/2019  Medication Sig  . acetaminophen (TYLENOL) 500 MG tablet Take 1-2 tablets (500-1,000 mg total) by mouth every 8 (eight) hours as needed.  Marland Kitchen albuterol (VENTOLIN HFA) 108 (90 Base) MCG/ACT inhaler Inhale 2 puffs into the lungs every 6 (six) hours as needed for wheezing or shortness of breath.  Marland Kitchen amLODipine (NORVASC) 2.5 MG tablet Take 1 tablet (2.5 mg total) by mouth daily.  . Ascorbic Acid (VITAMIN C) 1000 MG tablet Take by mouth.  Marland Kitchen BREO ELLIPTA 200-25 MCG/INH AEPB Take 1 puff by mouth daily.  . Calcium-Vitamin D 600-200 MG-UNIT tablet Take by mouth.  . Cholecalciferol (VITAMIN D3) 1000 units CAPS Take by mouth.  Marland Kitchen Cod Liver Oil 1000 MG CAPS Take by mouth.  . Cyanocobalamin (B-12) 2000 MCG TABS Take 1 tablet by mouth daily.  . Garlic 220 MG TABS Take by mouth.  . L-Lysine 500 MG TABS Take by mouth.  . levocetirizine (XYZAL) 5 MG tablet Take 1 tablet (5 mg total) by mouth every evening.  . magnesium oxide (MAG-OX) 400 MG tablet Take by mouth.  . montelukast (SINGULAIR) 10 MG tablet Take 1 tablet (10 mg total) by mouth at bedtime.  . Nutritional Supplements (JOINT FORMULA PO) Take 2 tablets by mouth daily.   Marland Kitchen olmesartan-hydrochlorothiazide (BENICAR HCT) 20-12.5 MG tablet Take 1 tablet by mouth daily.  Marland Kitchen  omeprazole (PRILOSEC) 40 MG capsule Take 1 capsule (40 mg total) by mouth daily.  . Potassium Gluconate 2.5 MEQ TABS Take by mouth.  . Rosuvastatin Calcium 5 MG CPSP Take 1 tablet by mouth every evening. (Patient not taking: Reported on 11/25/2019)   Facility-Administered Encounter Medications as of 11/25/2019  Medication  . cyanocobalamin ((VITAMIN B-12)) injection 1,000 mcg  . [COMPLETED] cyanocobalamin ((VITAMIN B-12)) injection 1,000 mcg    Activities of Daily Living In your present state of health, do you have any difficulty performing the following activities: 11/25/2019 10/18/2019  Hearing? N N  Comment declines hearing aids -  Vision? Y Y  Difficulty concentrating or making decisions? N N  Walking or climbing stairs? N N  Dressing or bathing? N N  Doing errands, shopping? N N  Preparing Food and eating ? N -  Using the Toilet? N -  In the past six months, have you accidently leaked urine? N -  Do you have problems with loss of bowel control? N -  Managing your Medications? N -  Managing your Finances? N -  Housekeeping or managing your Housekeeping? N -  Some recent data might be hidden    Patient Care Team: Alba Cory, MD as PCP - General (Family Medicine) Sidney Ace, MD as Referring Physician (Allergy) Lonell Face, MD as Consulting Physician (Neurology)    Assessment:   This is a routine wellness examination for Almer.  Exercise Activities and Dietary recommendations Current Exercise Habits: The patient does not participate in regular exercise at present, Exercise limited by: None identified  Goals    . DIET - INCREASE WATER INTAKE     Recommend drinking 6-8 glasses of water per day       Fall Risk Fall Risk  11/25/2019 10/18/2019 11/19/2018 09/14/2018 06/09/2018  Falls in the past year? 0 0 0 0 No  Number falls in past yr: 0 0 0 0 -  Injury with Fall? 0 0 0 0 -  Risk for fall due to : No Fall Risks - - - -  Follow up Falls prevention discussed - Falls  prevention discussed - -   FALL RISK PREVENTION PERTAINING TO THE HOME:  Any stairs in or around the home? Yes If so, do they handrails? Yes   Home free of loose throw rugs in walkways, pet beds, electrical cords, etc? Yes  Adequate lighting in your home to reduce risk of falls? Yes   ASSISTIVE DEVICES UTILIZED TO PREVENT FALLS:  Life alert? No  Use of a cane, walker or w/c? No  Grab bars in the bathroom? No  Shower chair or bench in shower? Yes  Elevated toilet seat or a handicapped toilet? No   DME ORDERS:  DME order needed?  No   TIMED UP AND GO:  Was the test performed? Yes .  Length of time to ambulate 10 feet: 5 sec.   GAIT:  Appearance of gait: Gait stead-fast and without the use of an assistive device.   Education: Fall risk prevention has been discussed.  Intervention(s) required? No   Depression Screen PHQ 2/9 Scores 11/25/2019 10/18/2019 04/15/2019 11/19/2018  PHQ - 2 Score 0 0 0 0  PHQ- 9 Score - 0 0 -     Cognitive Function     6CIT Screen 11/25/2019 11/19/2018  What Year? 0 points 0 points  What month? 0 points 0 points  What time? 0 points 0 points  Count back from 20 0 points 0 points  Months in reverse 2 points 0 points  Repeat phrase 2 points 0 points  Total Score 4 0    Immunization History  Administered Date(s) Administered  . Influenza, High Dose Seasonal PF 06/05/2015, 06/18/2016, 06/17/2017, 06/09/2018, 06/16/2019  . Influenza-Unspecified 06/05/2015  . Moderna SARS-COVID-2 Vaccination 10/19/2019, 11/16/2019  . Pneumococcal Conjugate-13 12/09/2013  . Pneumococcal Polysaccharide-23 04/13/2012  . Tdap 04/13/2012  . Zoster 07/24/2013    Qualifies for Shingles Vaccine? Yes  Zostavax completed 2014. Due for Shingrix. Education has been provided regarding the importance of this vaccine. Pt has been advised to call insurance company to determine out of  pocket expense. Advised may also receive vaccine at local pharmacy or Health Dept. Verbalized  acceptance and understanding.  Tdap: Up to date  Flu Vaccine: Up to date  Pneumococcal Vaccine: Up to date   Screening Tests Health Maintenance  Topic Date Due  . MAMMOGRAM  05/26/2020  . TETANUS/TDAP  04/13/2022  . COLONOSCOPY  06/12/2027  . INFLUENZA VACCINE  Completed  . DEXA SCAN  Completed  . Hepatitis C Screening  Completed  . PNA vac Low Risk Adult  Completed    Cancer Screenings:  Colorectal Screening: Completed 06/11/17. Repeat every 10 years;   Mammogram: Completed 05/27/19. Repeat every year.  Bone Density: Completed 06/01/19. Results reflect  OSTEOPENIA. Repeat every 2 years.   Lung Cancer Screening: (Low Dose CT Chest recommended if Age 73-80 years, 30 pack-year currently smoking OR have quit w/in 15years.) does not qualify.    Additional Screening:  Hepatitis C Screening: does qualify; Completed 04/10/12  Vision Screening: Recommended annual ophthalmology exams for early detection of glaucoma and other disorders of the eye. Is the patient up to date with their annual eye exam?  Yes  Who is the provider or what is the name of the office in which the pt attends annual eye exams? [facility]  Dental Screening: Recommended annual dental exams for proper oral hygiene  Community Resource Referral:  CRR required this visit?  No      Plan:     I have personally reviewed and addressed the Medicare Annual Wellness questionnaire and have noted the following in the patient's chart:  A. Medical and social history B. Use of alcohol, tobacco or illicit drugs  C. Current medications and supplements D. Functional ability and status E.  Nutritional status F.  Physical activity G. Advance directives H. List of other physicians I.  Hospitalizations, surgeries, and ER visits in previous 12 months J.  Vitals K. Screenings such as hearing and vision if needed, cognitive and depression L. Referrals and appointments   In addition, I have reviewed and discussed  with patient certain preventive protocols, quality metrics, and best practice recommendations. A written personalized care plan for preventive services as well as general preventive health recommendations were provided to patient.    Signed,  Reather Littler, LPN Nurse Health Advisor   Nurse Notes: B12 administered today. Pt states she is not currently taking recently prescribed crestor due to possible myalgia side effects.

## 2019-11-25 NOTE — Patient Instructions (Signed)
Ruth [name] , Thank you for taking time to come for your Medicare Wellness Visit. I appreciate your ongoing commitment to your health goals. Please review the following plan we discussed and let me know if I can assist you in the future.   Screening recommendations/referrals:  Colonoscopy: done 06/11/17 Mammogram: done 05/27/19 Bone Density: done 06/01/19 Recommended yearly ophthalmology/optometry visit for glaucoma screening and checkup Recommended yearly dental visit for hygiene and checkup  Vaccinations: Influenza vaccine: done 06/16/19 Pneumococcal vaccine: done 12/09/13 Tdap vaccine: done 04/13/12 Shingles vaccine: Shingrix discussed. Please contact your pharmacy for coverage information.  Covid-19: Completed 10/19/19 & 11/16/19  Advanced directives: Advance directive discussed with you today. I have provided a copy for you to complete at home and have notarized. Once this is complete please bring a copy in to our office so we can scan it into your chart.  Conditions/risks identified: Recommend increasing physical activity to at least 3 days per week.   Next appointment: Please follow up in one year for your Medicare Annual Wellness visit.     Preventive Care 53 Years and Older, Female Preventive care refers to lifestyle choices and visits with your health care provider that can promote health and wellness. What does preventive care include?  A yearly physical exam. This is also called an annual well check.  Dental exams once or twice a year.  Routine eye exams. Ask your health care provider how often you should have your eyes checked.  Personal lifestyle choices, including:  Daily care of your teeth and gums.  Regular physical activity.  Eating a healthy diet.  Avoiding tobacco and drug use.  Limiting alcohol use.  Practicing safe sex.  Taking low-dose aspirin every day.  Taking vitamin and mineral supplements as recommended by your health care provider. What happens  during an annual well check? The services and screenings done by your health care provider during your annual well check will depend on your age, overall health, lifestyle risk factors, and family history of disease. Counseling  Your health care provider may ask you questions about your:  Alcohol use.  Tobacco use.  Drug use.  Emotional well-being.  Home and relationship well-being.  Sexual activity.  Eating habits.  History of falls.  Memory and ability to understand (cognition).  Work and work Astronomer.  Reproductive health. Screening  You may have the following tests or measurements:  Height, weight, and BMI.  Blood pressure.  Lipid and cholesterol levels. These may be checked every 5 years, or more frequently if you are over 28 years old.  Skin check.  Lung cancer screening. You may have this screening every year starting at age 14 if you have a 30-pack-year history of smoking and currently smoke or have quit within the past 15 years.  Fecal occult blood test (FOBT) of the stool. You may have this test every year starting at age 37.  Flexible sigmoidoscopy or colonoscopy. You may have a sigmoidoscopy every 5 years or a colonoscopy every 10 years starting at age 32.  Hepatitis C blood test.  Hepatitis B blood test.  Sexually transmitted disease (STD) testing.  Diabetes screening. This is done by checking your blood sugar (glucose) after you have not eaten for a while (fasting). You may have this done every 1-3 years.  Bone density scan. This is done to screen for osteoporosis. You may have this done starting at age 4.  Mammogram. This may be done every 1-2 years. Talk to your health care provider about  how often you should have regular mammograms. Talk with your health care provider about your test results, treatment options, and if necessary, the need for more tests. Vaccines  Your health care provider may recommend certain vaccines, such  as:  Influenza vaccine. This is recommended every year.  Tetanus, diphtheria, and acellular pertussis (Tdap, Td) vaccine. You may need a Td booster every 10 years.  Zoster vaccine. You may need this after age 48.  Pneumococcal 13-valent conjugate (PCV13) vaccine. One dose is recommended after age 48.  Pneumococcal polysaccharide (PPSV23) vaccine. One dose is recommended after age 33. Talk to your health care provider about which screenings and vaccines you need and how often you need them. This information is not intended to replace advice given to you by your health care provider. Make sure you discuss any questions you have with your health care provider. Document Released: 10/06/2015 Document Revised: 05/29/2016 Document Reviewed: 07/11/2015 Elsevier Interactive Patient Education  2017 [place] Prevention in the Home Falls can cause injuries. They can happen to people of all ages. There are many things you can do to make your home safe and to help prevent falls. What can I do on the outside of my home?  Regularly fix the edges of walkways and driveways and fix any cracks.  Remove anything that might make you trip as you walk through a door, such as a raised step or threshold.  Trim any bushes or trees on the path to your home.  Use bright outdoor lighting.  Clear any walking paths of anything that might make someone trip, such as rocks or tools.  Regularly check to see if handrails are loose or broken. Make sure that both sides of any steps have handrails.  Any raised decks and porches should have guardrails on the edges.  Have any leaves, snow, or ice cleared regularly.  Use sand or salt on walking paths during winter.  Clean up any spills in your garage right away. This includes oil or grease spills. What can I do in the bathroom?  Use night lights.  Install grab bars by the toilet and in the tub and shower. Do not use towel bars as grab bars.  Use  non-skid mats or decals in the tub or shower.  If you need to sit down in the shower, use a plastic, non-slip stool.  Keep the floor dry. Clean up any water that spills on the floor as soon as it happens.  Remove soap buildup in the tub or shower regularly.  Attach bath mats securely with double-sided non-slip rug tape.  Do not have throw rugs and other things on the floor that can make you trip. What can I do in the bedroom?  Use night lights.  Make sure that you have a light by your bed that is easy to reach.  Do not use any sheets or blankets that are too big for your bed. They should not hang down onto the floor.  Have a firm chair that has side arms. You can use this for support while you get dressed.  Do not have throw rugs and other things on the floor that can make you trip. What can I do in the kitchen?  Clean up any spills right away.  Avoid walking on wet floors.  Keep items that you use a lot in easy-to-reach places.  If you need to reach something above you, use a strong step stool that has a grab bar.  Keep  electrical cords out of the way.  Do not use floor polish or wax that makes floors slippery. If you must use wax, use non-skid floor wax.  Do not have throw rugs and other things on the floor that can make you trip. What can I do with my stairs?  Do not leave any items on the stairs.  Make sure that there are handrails on both sides of the stairs and use them. Fix handrails that are broken or loose. Make sure that handrails are as long as the stairways.  Check any carpeting to make sure that it is firmly attached to the stairs. Fix any carpet that is loose or worn.  Avoid having throw rugs at the top or bottom of the stairs. If you do have throw rugs, attach them to the floor with carpet tape.  Make sure that you have a light switch at the top of the stairs and the bottom of the stairs. If you do not have them, ask someone to add them for you. What  else can I do to help prevent falls?  Wear shoes that:  Do not have high heels.  Have rubber bottoms.  Are comfortable and fit you well.  Are closed at the toe. Do not wear sandals.  If you use a stepladder:  Make sure that it is fully opened. Do not climb a closed stepladder.  Make sure that both sides of the stepladder are locked into place.  Ask someone to hold it for you, if possible.  Clearly mark and make sure that you can see:  Any grab bars or handrails.  First and last steps.  Where the edge of each step is.  Use tools that help you move around (mobility aids) if they are needed. These include:  Canes.  Walkers.  Scooters.  Crutches.  Turn on the lights when you go into a dark area. Replace any light bulbs as soon as they burn out.  Set up your furniture so you have a clear path. Avoid moving your furniture around.  If any of your floors are uneven, fix them.  If there are any pets around you, be aware of where they are.  Review your medicines with your doctor. Some medicines can make you feel dizzy. This can increase your chance of falling. Ask your doctor what other things that you can do to help prevent falls. This information is not intended to replace advice given to you by your health care provider. Make sure you discuss any questions you have with your health care provider. Document Released: 07/06/2009 Document Revised: 02/15/2016 Document Reviewed: 10/14/2014 Elsevier Interactive Patient Education  2017 [employer].

## 2019-12-27 ENCOUNTER — Other Ambulatory Visit: Payer: Self-pay

## 2019-12-27 ENCOUNTER — Ambulatory Visit (INDEPENDENT_AMBULATORY_CARE_PROVIDER_SITE_OTHER): Payer: Medicare HMO

## 2019-12-27 DIAGNOSIS — E538 Deficiency of other specified B group vitamins: Secondary | ICD-10-CM

## 2019-12-27 MED ORDER — CYANOCOBALAMIN 1000 MCG/ML IJ SOLN
1000.0000 ug | Freq: Once | INTRAMUSCULAR | Status: AC
  Administered 2019-12-27: 1000 ug via INTRAMUSCULAR

## 2020-01-24 DIAGNOSIS — M5033 Other cervical disc degeneration, cervicothoracic region: Secondary | ICD-10-CM | POA: Diagnosis not present

## 2020-01-24 DIAGNOSIS — R519 Headache, unspecified: Secondary | ICD-10-CM | POA: Diagnosis not present

## 2020-01-24 DIAGNOSIS — M9901 Segmental and somatic dysfunction of cervical region: Secondary | ICD-10-CM | POA: Diagnosis not present

## 2020-01-24 DIAGNOSIS — M9903 Segmental and somatic dysfunction of lumbar region: Secondary | ICD-10-CM | POA: Diagnosis not present

## 2020-01-26 ENCOUNTER — Ambulatory Visit (INDEPENDENT_AMBULATORY_CARE_PROVIDER_SITE_OTHER): Payer: Medicare HMO

## 2020-01-26 ENCOUNTER — Other Ambulatory Visit: Payer: Self-pay

## 2020-01-26 DIAGNOSIS — E538 Deficiency of other specified B group vitamins: Secondary | ICD-10-CM

## 2020-01-26 MED ORDER — CYANOCOBALAMIN 1000 MCG/ML IJ SOLN
1000.0000 ug | Freq: Once | INTRAMUSCULAR | Status: AC
  Administered 2020-01-26: 1000 ug via INTRAMUSCULAR

## 2020-01-31 DIAGNOSIS — R519 Headache, unspecified: Secondary | ICD-10-CM | POA: Diagnosis not present

## 2020-01-31 DIAGNOSIS — M9903 Segmental and somatic dysfunction of lumbar region: Secondary | ICD-10-CM | POA: Diagnosis not present

## 2020-01-31 DIAGNOSIS — M9901 Segmental and somatic dysfunction of cervical region: Secondary | ICD-10-CM | POA: Diagnosis not present

## 2020-01-31 DIAGNOSIS — M5033 Other cervical disc degeneration, cervicothoracic region: Secondary | ICD-10-CM | POA: Diagnosis not present

## 2020-02-07 DIAGNOSIS — M9901 Segmental and somatic dysfunction of cervical region: Secondary | ICD-10-CM | POA: Diagnosis not present

## 2020-02-07 DIAGNOSIS — M9903 Segmental and somatic dysfunction of lumbar region: Secondary | ICD-10-CM | POA: Diagnosis not present

## 2020-02-07 DIAGNOSIS — R519 Headache, unspecified: Secondary | ICD-10-CM | POA: Diagnosis not present

## 2020-02-07 DIAGNOSIS — M5033 Other cervical disc degeneration, cervicothoracic region: Secondary | ICD-10-CM | POA: Diagnosis not present

## 2020-02-09 DIAGNOSIS — Z008 Encounter for other general examination: Secondary | ICD-10-CM | POA: Diagnosis not present

## 2020-02-09 DIAGNOSIS — J45909 Unspecified asthma, uncomplicated: Secondary | ICD-10-CM | POA: Diagnosis not present

## 2020-02-09 DIAGNOSIS — G629 Polyneuropathy, unspecified: Secondary | ICD-10-CM | POA: Diagnosis not present

## 2020-02-09 DIAGNOSIS — I10 Essential (primary) hypertension: Secondary | ICD-10-CM | POA: Diagnosis not present

## 2020-02-09 DIAGNOSIS — E663 Overweight: Secondary | ICD-10-CM | POA: Diagnosis not present

## 2020-02-09 DIAGNOSIS — E785 Hyperlipidemia, unspecified: Secondary | ICD-10-CM | POA: Diagnosis not present

## 2020-02-09 DIAGNOSIS — K219 Gastro-esophageal reflux disease without esophagitis: Secondary | ICD-10-CM | POA: Diagnosis not present

## 2020-02-09 DIAGNOSIS — M1990 Unspecified osteoarthritis, unspecified site: Secondary | ICD-10-CM | POA: Diagnosis not present

## 2020-02-09 DIAGNOSIS — G8929 Other chronic pain: Secondary | ICD-10-CM | POA: Diagnosis not present

## 2020-02-09 DIAGNOSIS — G4730 Sleep apnea, unspecified: Secondary | ICD-10-CM | POA: Diagnosis not present

## 2020-02-09 DIAGNOSIS — H269 Unspecified cataract: Secondary | ICD-10-CM | POA: Diagnosis not present

## 2020-02-14 DIAGNOSIS — M9901 Segmental and somatic dysfunction of cervical region: Secondary | ICD-10-CM | POA: Diagnosis not present

## 2020-02-14 DIAGNOSIS — M9903 Segmental and somatic dysfunction of lumbar region: Secondary | ICD-10-CM | POA: Diagnosis not present

## 2020-02-14 DIAGNOSIS — M5033 Other cervical disc degeneration, cervicothoracic region: Secondary | ICD-10-CM | POA: Diagnosis not present

## 2020-02-14 DIAGNOSIS — R519 Headache, unspecified: Secondary | ICD-10-CM | POA: Diagnosis not present

## 2020-02-17 DIAGNOSIS — H2511 Age-related nuclear cataract, right eye: Secondary | ICD-10-CM | POA: Diagnosis not present

## 2020-02-23 ENCOUNTER — Ambulatory Visit: Payer: Medicare HMO | Admitting: Emergency Medicine

## 2020-02-23 ENCOUNTER — Other Ambulatory Visit: Payer: Self-pay

## 2020-02-23 DIAGNOSIS — M9901 Segmental and somatic dysfunction of cervical region: Secondary | ICD-10-CM | POA: Diagnosis not present

## 2020-02-23 DIAGNOSIS — R519 Headache, unspecified: Secondary | ICD-10-CM | POA: Diagnosis not present

## 2020-02-23 DIAGNOSIS — M5033 Other cervical disc degeneration, cervicothoracic region: Secondary | ICD-10-CM | POA: Diagnosis not present

## 2020-02-23 DIAGNOSIS — M9903 Segmental and somatic dysfunction of lumbar region: Secondary | ICD-10-CM | POA: Diagnosis not present

## 2020-03-06 DIAGNOSIS — M5033 Other cervical disc degeneration, cervicothoracic region: Secondary | ICD-10-CM | POA: Diagnosis not present

## 2020-03-06 DIAGNOSIS — M9903 Segmental and somatic dysfunction of lumbar region: Secondary | ICD-10-CM | POA: Diagnosis not present

## 2020-03-06 DIAGNOSIS — R519 Headache, unspecified: Secondary | ICD-10-CM | POA: Diagnosis not present

## 2020-03-06 DIAGNOSIS — M9901 Segmental and somatic dysfunction of cervical region: Secondary | ICD-10-CM | POA: Diagnosis not present

## 2020-03-07 ENCOUNTER — Telehealth: Payer: Self-pay | Admitting: Family Medicine

## 2020-03-07 NOTE — Chronic Care Management (AMB) (Signed)
  Chronic Care Management   Outreach Note  03/07/2020 Name: Ruth [name] MRN: [id number] DOB: October 17, 1945  Ruth [name] is a 74 y.o. year old female who is a primary care patient of Alba Cory, MD. I reached out to [name] by phone today in response to a referral sent by Ms. Audrie Lia Haefele's health plan.     An unsuccessful telephone outreach was attempted today. The patient was referred to the case management team for assistance with care management and care coordination.   Follow Up Plan: A HIPPA compliant phone message was left for the patient providing contact information and requesting a return call. The care management team will reach out to the patient again over the next 7 days. If patient returns call to provider office, please advise to call Embedded Care Management Care Guide Gwenevere Ghazi at [phone].  Gwenevere Ghazi  Care Guide, Embedded Care Coordination [facility]  [place], [place] [address] Direct Dial: [phone] [email or web address] Website: [email or web address]

## 2020-03-10 NOTE — Chronic Care Management (AMB) (Signed)
  Chronic Care Management   Note  03/10/2020 Name: Ruth [name] MRN: [id number] DOB: November 15, 1945  Ruth [name] is a 74 y.o. year old female who is a primary care patient of Steele Sizer, MD. I reached out to [name] by phone today in response to a referral sent by [name]'s health plan.     [name] was given information about Chronic Care Management services today including:  1. CCM service includes personalized support from designated clinical staff supervised by her physician, including individualized plan of care and coordination with other care providers 2. 24/7 contact phone numbers for assistance for urgent and routine care needs. 3. Service will only be billed when office clinical staff spend 20 minutes or more in a month to coordinate care. 4. Only one practitioner may furnish and bill the service in a calendar month. 5. The patient may stop CCM services at any time (effective at the end of the month) by phone call to the office staff. 6. The patient will be responsible for cost sharing (co-pay) of up to 20% of the service fee (after annual deductible is met).  Patient agreed to services and verbal consent obtained.   Follow up plan: Telephone appointment with care management team member scheduled for:04/06/2020.  [place], [place] [address] Direct Dial: [phone] [email or web address]: [email or web address]

## 2020-03-24 ENCOUNTER — Ambulatory Visit (INDEPENDENT_AMBULATORY_CARE_PROVIDER_SITE_OTHER): Payer: Medicare HMO

## 2020-03-24 ENCOUNTER — Other Ambulatory Visit: Payer: Self-pay

## 2020-03-24 DIAGNOSIS — E538 Deficiency of other specified B group vitamins: Secondary | ICD-10-CM

## 2020-03-30 ENCOUNTER — Other Ambulatory Visit: Payer: Self-pay | Admitting: Family Medicine

## 2020-03-30 DIAGNOSIS — I10 Essential (primary) hypertension: Secondary | ICD-10-CM

## 2020-03-30 DIAGNOSIS — K219 Gastro-esophageal reflux disease without esophagitis: Secondary | ICD-10-CM

## 2020-03-30 NOTE — Telephone Encounter (Signed)
Approved per protocol. Future visit scheduled.  Requested Prescriptions  Pending Prescriptions Disp Refills  . omeprazole (PRILOSEC) 40 MG capsule [Pharmacy Med Name: Omeprazole 40 MG Oral Capsule Delayed Release] 90 capsule 0    Sig: Take 1 capsule by mouth once daily     Gastroenterology: Proton Pump Inhibitors Passed - 03/30/2020  9:46 AM      Passed - Valid encounter within last 12 months    Recent Outpatient Visits          5 months ago Dyslipidemia   [facility] Alba Cory, MD   11 months ago Essential hypertension   [facility] Alba Cory, MD   1 year ago Essential hypertension   [facility] Alba Cory, MD   1 year ago Lymphadenopathy, supraclavicular   [facility] Alba Cory, MD   1 year ago Essential hypertension   [facility] Alba Cory, MD      Future Appointments            In 3 weeks Alba Cory, MD [facility]   In 8 months  [facility], [facility]

## 2020-04-03 DIAGNOSIS — M9903 Segmental and somatic dysfunction of lumbar region: Secondary | ICD-10-CM | POA: Diagnosis not present

## 2020-04-03 DIAGNOSIS — M9901 Segmental and somatic dysfunction of cervical region: Secondary | ICD-10-CM | POA: Diagnosis not present

## 2020-04-03 DIAGNOSIS — M5033 Other cervical disc degeneration, cervicothoracic region: Secondary | ICD-10-CM | POA: Diagnosis not present

## 2020-04-03 DIAGNOSIS — R519 Headache, unspecified: Secondary | ICD-10-CM | POA: Diagnosis not present

## 2020-04-05 ENCOUNTER — Encounter: Payer: Self-pay | Admitting: Ophthalmology

## 2020-04-05 ENCOUNTER — Other Ambulatory Visit: Payer: Self-pay

## 2020-04-05 DIAGNOSIS — M069 Rheumatoid arthritis, unspecified: Secondary | ICD-10-CM | POA: Diagnosis not present

## 2020-04-05 DIAGNOSIS — H2512 Age-related nuclear cataract, left eye: Secondary | ICD-10-CM | POA: Diagnosis not present

## 2020-04-06 ENCOUNTER — Ambulatory Visit (INDEPENDENT_AMBULATORY_CARE_PROVIDER_SITE_OTHER): Payer: Medicare HMO | Admitting: Pharmacist

## 2020-04-06 DIAGNOSIS — M85852 Other specified disorders of bone density and structure, left thigh: Secondary | ICD-10-CM

## 2020-04-06 DIAGNOSIS — E785 Hyperlipidemia, unspecified: Secondary | ICD-10-CM

## 2020-04-06 NOTE — Chronic Care Management (AMB) (Signed)
Chronic Care Management Pharmacy  Name: Ruth [name]  MRN: [id number] DOB: 10-18-1945  Chief Complaint/ HPI  Ruth [name],  74 y.o. , female presents for their Initial CCM visit with the clinical pharmacist via telephone due to COVID-19 Pandemic.  PCP : Steele Sizer, MD  Their chronic conditions include: HTN, HLD  Office Visits: 1/25 HLD, [place], BP 130/70 P 95 Wt 151 BMI 29.5, noncompliant to CPAP, neuropathy, failed Neurontin/Lyrica,  Consult Visit: NA  Medications: Outpatient Encounter Medications as of 04/06/2020  Medication Sig Note  . acetaminophen (TYLENOL) 500 MG tablet Take 1-2 tablets (500-1,000 mg total) by mouth every 8 (eight) hours as needed.   Marland Kitchen albuterol (VENTOLIN HFA) 108 (90 Base) MCG/ACT inhaler Inhale 2 puffs into the lungs every 6 (six) hours as needed for wheezing or shortness of breath.   Marland Kitchen amLODipine (NORVASC) 2.5 MG tablet Take 1 tablet by mouth once daily   . Ascorbic Acid (VITAMIN C) 1000 MG tablet Take by mouth. 03/11/2016: Received from: [place]  . BREO ELLIPTA 200-25 MCG/INH AEPB Take 1 puff by mouth daily.   . Calcium-Vitamin D 600-200 MG-UNIT tablet Take 1 tablet by mouth 2 (two) times daily.  03/11/2016: Received from: [place]  . Cholecalciferol (VITAMIN D3) 1000 units CAPS Take by mouth. 03/11/2016: Received from: [place]  . Cod Liver Oil 1000 MG CAPS Take by mouth.   . Cyanocobalamin (B-12) 2000 MCG TABS Take 1 tablet by mouth daily.   . Garlic 716 MG TABS Take 1,000 mg by mouth.    . L-Lysine 500 MG TABS Take by mouth. 03/11/2016: Received from: [place]  . levocetirizine (XYZAL) 5 MG tablet Take 1 tablet (5 mg total) by mouth every evening.   . magnesium oxide (MAG-OX) 400 MG tablet Take 500 mg by mouth daily.  03/11/2016: Received from: [place]  . montelukast (SINGULAIR) 10 MG tablet Take 1 tablet (10 mg total) by mouth at bedtime.     . Nutritional Supplements (JOINT FORMULA PO) Take 2 tablets by mouth daily. glucosamine   . olmesartan-hydrochlorothiazide (BENICAR HCT) 20-12.5 MG tablet Take 1 tablet by mouth daily.   Marland Kitchen omeprazole (PRILOSEC) 40 MG capsule Take 1 capsule by mouth once daily   . Potassium Gluconate 2.5 MEQ TABS Take 2 tablets by mouth.    Marland Kitchen rOPINIRole (REQUIP) 0.5 MG tablet Take 0.5 mg by mouth at bedtime.   . Rosuvastatin Calcium 5 MG CPSP Take 1 tablet by mouth every evening.   . Zinc 22.5 MG TABS Take by mouth daily. (Patient not taking: Reported on 04/06/2020)    Facility-Administered Encounter Medications as of 04/06/2020  Medication  . cyanocobalamin ((VITAMIN B-12)) injection 1,000 mcg     Financial Resource Strain: Low Risk   . Difficulty of Paying Living Expenses: Not hard at all    Current Diagnosis/Assessment:  Goals Addressed            This Visit's Progress   . Chronic Care Management       CARE PLAN ENTRY (see longitudinal plan of care for additional care plan information)  Current Barriers:  . Chronic Disease Management support, education, and care coordination needs related to Hypertension, Hyperlipidemia, and Osteoporosis   Hypertension BP Readings from Last 3 Encounters:  11/25/19 122/72  10/18/19 130/70  04/15/19 134/78   . Pharmacist Clinical Goal(s): o Over the next 90 days, patient will work with PharmD and providers to maintain  BP goal <130/80 . Current regimen:  o Benicar HCT 20/12.'5mg'$  daily o Amlodipine 2.5 mg daily . Interventions: o None . Patient self care activities - Over the next 90 days, patient will: o Check BP weekly, document, and provide at future appointments o Ensure daily salt intake < 2300 mg/day  Hyperlipidemia Lab Results  Component Value Date/Time   LDLCALC 85 10/18/2019 10:27 AM   . Pharmacist Clinical Goal(s): o Over the next 90 days, patient will work with PharmD and providers to maintain LDL goal < 100 . Current regimen:   o Crestor '5mg'$  daily . Interventions: o None . Patient self care activities - Over the next 90 days, patient will: o Continue to take rosuvastatin '5mg'$  every day  Osteopenia . Pharmacist Clinical Goal(s) o Over the next 90 days, patient will work with PharmD and providers to maintain bone health. Current T score = -1.9. Goal to maintain above -2.5 to avoid osteoporosis. . Current regimen:  o Calcium 600/200 mg/iu twice daily o Vitamin D3 1000iu daily . Interventions: o None . Patient self care activities - Over the next 90 days, patient will: o Continue to eat dairy when possible and take calcium and vitamin D supplements  Medication management . Pharmacist Clinical Goal(s): o Over the next 90 days, patient will work with PharmD and providers to achieve optimal medication adherence . Current pharmacy: [facility] . Interventions o Comprehensive medication review performed. o Utilize [facility] for medication synchronization, packaging and delivery . Patient self care activities - Over the next 90 days, patient will: o Focus on medication adherence by stopping vitamin C due to reflux o Work with PharmD on transition to [facility] pharmacy o Take medications as prescribed o Report any questions or concerns to PharmD and/or provider(s)  Initial goal documentation       Hypertension   BP goal is:  <130/80  Office blood pressures are  BP Readings from Last 3 Encounters:  11/25/19 122/72  10/18/19 130/70  04/15/19 134/78   Patient checks BP at home infrequently Patient home BP readings are ranging: NA  Patient has failed these meds in the past: NA Patient is currently controlled on the following medications:  . Amlodipine 2.'5mg'$  daily . Benicar HCT 20/12.'5mg'$  daily  We discussed  At goal Denies hypotension  Plan  Continue current medications   Neuropathy   Patient has failed these meds in past: gabapentin Patient is currently uncontrolled on the following  medications:  . Tylenol  We discussed:   Cymbalta Didn't want to take Lyrica, but now willing Needs Requip, Lyrica refill because bottle is from 2019 Toes spreading (cramping) Takes 2 glucosamine  Tylenol rarely, about 3 times monthly Lysine for fever blister Mustard eases cramps  Plan  Recommend restart Lyrica '50mg'$  1 tab by mouth twice daily Re-order Requip due to 2019 date  Osteopenia   Last DEXA Scan: 05/2019   T-Score femoral neck: -1.9  T-Score total hip: NA  T-Score lumbar spine: -1.9  T-Score forearm radius: NA   Vit D, 25-Hydroxy  Date Value Ref Range Status  10/18/2019 38 30 - 100 ng/mL Final    Comment:    Vitamin D Status         25-OH Vitamin D: . Deficiency:                    <20 ng/mL Insufficiency:             20 - 29 ng/mL Optimal:                 >  or = 30 ng/mL . For 25-OH Vitamin D testing on patients on  D2-supplementation and patients for whom quantitation  of D2 and D3 fractions is required, the Quest[facility]ureD(TM) 25-OH VIT D, (D2,D3), LC/MS/MS is recommended: order  code [phone] (patients >48yr). See Note 1 . Note 1 . For additional information, please refer to  http://education.Quest[facility].com/faq/FAQ199  (This link is being provided for informational/ educational purposes only.)      Patient is not a candidate for pharmacologic treatment  Patient has failed these meds in past: NA Patient is currently controlled on the following medications:  . Calcium/D 600/200 twice daily . Vitamin D3 1000 iu daily  We discussed:  Recommend 1200 mg of calcium daily from dietary and supplemental sources.  Plan  Continue current medications   Hyperlipidemia   LDL goal < 100  Lipid Panel     Component Value Date/Time   CHOL 155 10/18/2019 1027   CHOL 165 06/06/2015 0834   TRIG 177 (H) 10/18/2019 1027   HDL 43 (L) 10/18/2019 1027   HDL 48 06/06/2015 0834   LDLCALC 85 10/18/2019 1027    Hepatic Function Latest Ref Rng & Units  10/18/2019 11/12/2018 11/05/2017  Total Protein 6.1 - 8.1 g/dL 7.1 7.0 8.0  Albumin 3.6 - 5.1 g/dL - - -  AST 10 - 35 U/L '22 23 21  '$ ALT 6 - 29 U/L '21 17 20  '$ Alk Phosphatase 33 - 130 U/L - - -  Total Bilirubin 0.2 - 1.2 mg/dL 0.4 0.5 0.4     The 10-year ASCVD risk score (Mikey BussingDC Jr., et al., 2013) is: 9.6%   Values used to calculate the score:     Age: [age]years     Sex: Female     Is Non-Hispanic African American: Yes     Diabetic: No     Tobacco smoker: No     Systolic Blood Pressure: 1357mmHg     Is BP treated: Yes     HDL Cholesterol: 43 mg/dL     Total Cholesterol: 155 mg/dL   Patient has failed these meds in past: NA Patient is currently controlled on the following medications:  . Crestor 5 mg daily  We discussed:   At goal Denies myalgias  Plan  Continue current medications  Medication Management   [facility] Uses pill box? Yes Pt endorses 70% compliance Interested in [facility]  We discussed:  Takes all at night Zero copays? olmesartan $45 for 90 days Breathing dx? Quit vitamin C due to GERD albuterol about every 6 months Needs rosuvastatin refill Singulair, Xyzal from [facility] Doesn't use Flonase  Plan  Patient will d/c Vitamin C due to potential GERD worsening  Utilize [facility] for medication synchronization, packaging and delivery  Verbal consent obtained for [facility] enhanced pharmacy services (medication synchronization, adherence packaging, delivery coordination). A medication sync plan was created to allow patient to get all medications delivered once every 30 to 90 days per patient preference. Patient understands they have freedom to choose pharmacy and clinical pharmacist will coordinate care between all prescribers and [facility].   Follow up: 3 month phone visit  TMilus Height PharmD, [phone]

## 2020-04-08 NOTE — Patient Instructions (Addendum)
Visit Information  Goals Addressed            This Visit's Progress   . Chronic Care Management       CARE PLAN ENTRY (see longitudinal plan of care for additional care plan information)  Current Barriers:  . Chronic Disease Management support, education, and care coordination needs related to Hypertension, Hyperlipidemia, and Osteoporosis   Hypertension BP Readings from Last 3 Encounters:  11/25/19 122/72  10/18/19 130/70  04/15/19 134/78   . Pharmacist Clinical Goal(s): o Over the next 90 days, patient will work with PharmD and providers to maintain BP goal <130/80 . Current regimen:  o Benicar HCT 20/12.5mg  daily o Amlodipine 2.5 mg daily . Interventions: o None . Patient self care activities - Over the next 90 days, patient will: o Check BP weekly, document, and provide at future appointments o Ensure daily salt intake < 2300 mg/day  Hyperlipidemia Lab Results  Component Value Date/Time   LDLCALC 85 10/18/2019 10:27 AM   . Pharmacist Clinical Goal(s): o Over the next 90 days, patient will work with PharmD and providers to maintain LDL goal < 100 . Current regimen:  o Crestor 5mg  daily . Interventions: o None . Patient self care activities - Over the next 90 days, patient will: o Continue to take rosuvastatin 5mg  every day  Osteopenia . Pharmacist Clinical Goal(s) o Over the next 90 days, patient will work with PharmD and providers to maintain bone health. Current T score = -1.9. Goal to maintain above -2.5 to avoid osteoporosis. . Current regimen:  o Calcium 600/200 mg/iu twice daily o Vitamin D3 1000iu daily . Interventions: o None . Patient self care activities - Over the next 90 days, patient will: o Continue to eat dairy when possible and take calcium and vitamin D supplements  Medication management . Pharmacist Clinical Goal(s): o Over the next 90 days, patient will work with PharmD and providers to achieve optimal medication adherence . Current  pharmacy: [facility] . Interventions o Comprehensive medication review performed. o Utilize [facility] for medication synchronization, packaging and delivery . Patient self care activities - Over the next 90 days, patient will: o Focus on medication adherence by stopping vitamin C due to reflux o Work with PharmD on transition to [facility] pharmacy o Take medications as prescribed o Report any questions or concerns to PharmD and/or provider(s)  Initial goal documentation        Ruth [name] was given information about Chronic Care Management services today including:  1. CCM service includes personalized support from designated clinical staff supervised by her physician, including individualized plan of care and coordination with other care providers 2. 24/7 contact phone numbers for assistance for urgent and routine care needs. 3. Standard insurance, coinsurance, copays and deductibles apply for chronic care management only during months in which we provide at least 20 minutes of these services. Most insurances cover these services at 100%, however patients may be responsible for any copay, coinsurance and/or deductible if applicable. This service may help you avoid the need for more expensive face-to-face services. 4. Only one practitioner may furnish and bill the service in a calendar month. 5. The patient may stop CCM services at any time (effective at the end of the month) by phone call to the office staff.  Patient agreed to services and verbal consent obtained.   Print copy of patient instructions provided.  Telephone follow up appointment with pharmacy team member scheduled for: 3 months  , PharmD, BCGP,  CTTS Clinical Pharmacist [facility] [phone]  Osteopenia  Osteopenia is a loss of thickness (density) inside of the bones. Another name for osteopenia is low bone mass. Mild osteopenia is a normal part of aging. It is not a disease, and it  does not cause symptoms. However, if you have osteopenia and continue to lose bone mass, you could develop a condition that causes the bones to become thin and break more easily (osteoporosis). You may also lose some height, have back pain, and have a stooped posture. Although osteopenia is not a disease, making changes to your lifestyle and diet can help to prevent osteopenia from developing into osteoporosis. What are the causes? Osteopenia is caused by loss of calcium in the bones.  Bones are constantly changing. Old bone cells are continually being replaced with new bone cells. This process builds new bone. The mineral calcium is needed to build new bone and maintain bone density. Bone density is usually highest around age 68. After that, most people's bodies cannot replace all the bone they have lost with new bone. What increases the risk? You are more likely to develop this condition if:  You are older than age 59.  You are a woman who went through menopause early.  You have a long illness that keeps you in bed.  You do not get enough exercise.  You lack certain nutrients (malnutrition).  You have an overactive thyroid gland (hyperthyroidism).  You smoke.  You drink a lot of alcohol.  You are taking medicines that weaken the bones, such as steroids. What are the signs or symptoms? This condition does not cause any symptoms. You may have a slightly higher risk for bone breaks (fractures), so getting fractures more easily than normal may be an indication of osteopenia. How is this diagnosed? Your health care provider can diagnose this condition with a special type of X-ray exam that measures bone density (dual-energy X-ray absorptiometry, DEXA). This test can measure bone density in your hips, spine, and wrists. Osteopenia has no symptoms, so this condition is usually diagnosed after a routine bone density screening test is done for osteoporosis. This routine screening is usually done  for:  Women who are age 60 or older.  Men who are age 73 or older. If you have risk factors for osteopenia, you may have the screening test at an earlier age. How is this treated? Making dietary and lifestyle changes can lower your risk for osteoporosis. If you have severe osteopenia that is close to becoming osteoporosis, your health care provider may prescribe medicines and dietary supplements such as calcium and vitamin D. These supplements help to rebuild bone density. Follow these instructions at home:   Take over-the-counter and prescription medicines only as told by your health care provider. These include vitamins and supplements.  Eat a diet that is high in calcium and vitamin D. ? Calcium is found in dairy products, beans, salmon, and leafy green vegetables like spinach and broccoli. ? Look for foods that have vitamin D and calcium added to them (fortified foods), such as orange juice, cereal, and bread.  Do 30 or more minutes of a weight-bearing exercise every day, such as walking, jogging, or playing a sport. These types of exercises strengthen the bones.  Take precautions at home to lower your risk of falling, such as: ? Keeping rooms well-lit and free of clutter, such as cords. ? Installing safety rails on stairs. ? Using rubber mats in the bathroom or other areas that  are often wet or slippery.  Do not use any products that contain nicotine or tobacco, such as cigarettes and e-cigarettes. If you need help quitting, ask your health care provider.  Avoid alcohol or limit alcohol intake to no more than 1 drink a day for nonpregnant women and 2 drinks a day for men. One drink equals 12 oz of beer, 5 oz of wine, or 1 oz of hard liquor.  Keep all follow-up visits as told by your health care provider. This is important. Contact a health care provider if:  You have not had a bone density screening for osteoporosis and you are: ? A woman, age 37 or older. ? A man, age 85 or  older.  You are a postmenopausal woman who has not had a bone density screening for osteoporosis.  You are older than age 1 and you want to know if you should have bone density screening for osteoporosis. Summary  Osteopenia is a loss of thickness (density) inside of the bones. Another name for osteopenia is low bone mass.  Osteopenia is not a disease, but it may increase your risk for a condition that causes the bones to become thin and break more easily (osteoporosis).  You may be at risk for osteopenia if you are older than age 26 or if you are a woman who went through early menopause.  Osteopenia does not cause any symptoms, but it can be diagnosed with a bone density screening test.  Dietary and lifestyle changes are the first treatment for osteopenia. These may lower your risk for osteoporosis. This information is not intended to replace advice given to you by your health care provider. Make sure you discuss any questions you have with your health care provider. Document Revised: 08/22/2017 Document Reviewed: 06/18/2017 Elsevier Patient Education  2020 [employer].

## 2020-04-14 NOTE — Discharge Instructions (Signed)

## 2020-04-17 ENCOUNTER — Ambulatory Visit: Payer: Medicare HMO | Admitting: Family Medicine

## 2020-04-18 ENCOUNTER — Encounter: Payer: Self-pay | Admitting: Ophthalmology

## 2020-04-18 ENCOUNTER — Ambulatory Visit: Payer: Medicare HMO | Admitting: Anesthesiology

## 2020-04-18 ENCOUNTER — Other Ambulatory Visit: Payer: Self-pay

## 2020-04-18 ENCOUNTER — Ambulatory Visit
Admission: RE | Admit: 2020-04-18 | Discharge: 2020-04-18 | Disposition: A | Discharge status: Home / Self Care | Payer: Medicare HMO | Attending: Ophthalmology | Admitting: Ophthalmology

## 2020-04-18 ENCOUNTER — Encounter
Admission: RE | Disposition: A | Discharge status: Home / Self Care | Payer: Self-pay | Source: Home / Self Care | Attending: Ophthalmology

## 2020-04-18 DIAGNOSIS — E7800 Pure hypercholesterolemia, unspecified: Secondary | ICD-10-CM | POA: Insufficient documentation

## 2020-04-18 DIAGNOSIS — G4730 Sleep apnea, unspecified: Secondary | ICD-10-CM | POA: Insufficient documentation

## 2020-04-18 DIAGNOSIS — Z8711 Personal history of peptic ulcer disease: Secondary | ICD-10-CM | POA: Insufficient documentation

## 2020-04-18 DIAGNOSIS — K589 Irritable bowel syndrome without diarrhea: Secondary | ICD-10-CM | POA: Insufficient documentation

## 2020-04-18 DIAGNOSIS — M419 Scoliosis, unspecified: Secondary | ICD-10-CM | POA: Insufficient documentation

## 2020-04-18 DIAGNOSIS — I10 Essential (primary) hypertension: Secondary | ICD-10-CM | POA: Insufficient documentation

## 2020-04-18 DIAGNOSIS — M7989 Other specified soft tissue disorders: Secondary | ICD-10-CM | POA: Diagnosis not present

## 2020-04-18 DIAGNOSIS — R0601 Orthopnea: Secondary | ICD-10-CM | POA: Diagnosis not present

## 2020-04-18 DIAGNOSIS — K219 Gastro-esophageal reflux disease without esophagitis: Secondary | ICD-10-CM | POA: Insufficient documentation

## 2020-04-18 DIAGNOSIS — H25812 Combined forms of age-related cataract, left eye: Secondary | ICD-10-CM | POA: Diagnosis not present

## 2020-04-18 DIAGNOSIS — E890 Postprocedural hypothyroidism: Secondary | ICD-10-CM | POA: Insufficient documentation

## 2020-04-18 DIAGNOSIS — R002 Palpitations: Secondary | ICD-10-CM | POA: Insufficient documentation

## 2020-04-18 DIAGNOSIS — Z6827 Body mass index (BMI) 27.0-27.9, adult: Secondary | ICD-10-CM | POA: Insufficient documentation

## 2020-04-18 DIAGNOSIS — J45909 Unspecified asthma, uncomplicated: Secondary | ICD-10-CM | POA: Insufficient documentation

## 2020-04-18 DIAGNOSIS — M810 Age-related osteoporosis without current pathological fracture: Secondary | ICD-10-CM | POA: Diagnosis not present

## 2020-04-18 DIAGNOSIS — M1990 Unspecified osteoarthritis, unspecified site: Secondary | ICD-10-CM | POA: Insufficient documentation

## 2020-04-18 DIAGNOSIS — G629 Polyneuropathy, unspecified: Secondary | ICD-10-CM | POA: Diagnosis not present

## 2020-04-18 DIAGNOSIS — E892 Postprocedural hypoparathyroidism: Secondary | ICD-10-CM | POA: Diagnosis not present

## 2020-04-18 DIAGNOSIS — H2512 Age-related nuclear cataract, left eye: Secondary | ICD-10-CM | POA: Insufficient documentation

## 2020-04-18 HISTORY — DX: Scoliosis, unspecified: M41.9

## 2020-04-18 HISTORY — DX: Sleep apnea, unspecified: G47.30

## 2020-04-18 HISTORY — DX: Polyneuropathy, unspecified: G62.9

## 2020-04-18 HISTORY — PX: CATARACT EXTRACTION W/PHACO: SHX586

## 2020-04-18 SURGERY — PHACOEMULSIFICATION, CATARACT, WITH IOL INSERTION
Anesthesia: Monitor Anesthesia Care | Site: Eye | Laterality: Left

## 2020-04-18 MED ORDER — OXYCODONE HCL 5 MG PO TABS: 5.0000 mg | ORAL_TABLET | Freq: Once | ORAL | Status: DC | PRN

## 2020-04-18 MED ORDER — NA CHONDROIT SULF-NA HYALURON 40-17 MG/ML IO SOLN
INTRAOCULAR | Status: DC | PRN
  Administered 2020-04-18: 1 mL via INTRAOCULAR

## 2020-04-18 MED ORDER — LIDOCAINE HCL (PF) 2 % IJ SOLN
INTRAOCULAR | Status: DC | PRN
  Administered 2020-04-18: 1 mL

## 2020-04-18 MED ORDER — BRIMONIDINE TARTRATE-TIMOLOL 0.2-0.5 % OP SOLN
OPHTHALMIC | Status: DC | PRN
  Administered 2020-04-18: 1 [drp] via OPHTHALMIC

## 2020-04-18 MED ORDER — LACTATED RINGERS IV SOLN: INTRAVENOUS | Status: DC

## 2020-04-18 MED ORDER — ARMC OPHTHALMIC DILATING DROPS
1.0000 "application " | OPHTHALMIC | Status: DC | PRN
  Administered 2020-04-18 (×3): 1 via OPHTHALMIC

## 2020-04-18 MED ORDER — OXYCODONE HCL 5 MG/5ML PO SOLN: 5.0000 mg | Freq: Once | ORAL | Status: DC | PRN

## 2020-04-18 MED ORDER — TETRACAINE HCL 0.5 % OP SOLN
1.0000 [drp] | OPHTHALMIC | Status: DC | PRN
  Administered 2020-04-18 (×3): 1 [drp] via OPHTHALMIC

## 2020-04-18 MED ORDER — MOXIFLOXACIN HCL 0.5 % OP SOLN
OPHTHALMIC | Status: DC | PRN
  Administered 2020-04-18: 0.2 mL via OPHTHALMIC

## 2020-04-18 MED ORDER — FENTANYL CITRATE (PF) 100 MCG/2ML IJ SOLN
INTRAMUSCULAR | Status: DC | PRN
  Administered 2020-04-18: 50 ug via INTRAVENOUS

## 2020-04-18 MED ORDER — EPINEPHRINE PF 1 MG/ML IJ SOLN
INTRAOCULAR | Status: DC | PRN
  Administered 2020-04-18: 43 mL via OPHTHALMIC

## 2020-04-18 MED ORDER — MIDAZOLAM HCL 2 MG/2ML IJ SOLN
INTRAMUSCULAR | Status: DC | PRN
  Administered 2020-04-18 (×2): 1 mg via INTRAVENOUS

## 2020-04-18 SURGICAL SUPPLY — 20 items
CANNULA ANT/CHMB 27G (MISCELLANEOUS) ×2 IMPLANT
CANNULA ANT/CHMB 27GA (MISCELLANEOUS) ×4 IMPLANT
GLOVE SURG LX 8.0 MICRO (GLOVE) ×1
GLOVE SURG LX STRL 8.0 MICRO (GLOVE) ×1 IMPLANT
GLOVE SURG TRIUMPH 8.0 PF LTX (GLOVE) ×2 IMPLANT
GOWN STRL REUS W/ TWL LRG LVL3 (GOWN DISPOSABLE) ×2 IMPLANT
GOWN STRL REUS W/TWL LRG LVL3 (GOWN DISPOSABLE) ×4
LENS IOL DIOP 21.0 (Intraocular Lens) ×2 IMPLANT
LENS IOL TECNIS MONO 21.0 (Intraocular Lens) IMPLANT
MARKER SKIN DUAL TIP RULER LAB (MISCELLANEOUS) ×2 IMPLANT
NDL FILTER BLUNT 18X1 1/2 (NEEDLE) ×1 IMPLANT
NEEDLE FILTER BLUNT 18X 1/2SAF (NEEDLE) ×1
NEEDLE FILTER BLUNT 18X1 1/2 (NEEDLE) ×1 IMPLANT
PACK EYE AFTER SURG (MISCELLANEOUS) ×2 IMPLANT
PACK OPTHALMIC (MISCELLANEOUS) ×2 IMPLANT
PACK PORFILIO (MISCELLANEOUS) ×2 IMPLANT
SYR 3ML LL SCALE MARK (SYRINGE) ×2 IMPLANT
SYR TB 1ML LUER SLIP (SYRINGE) ×2 IMPLANT
WATER STERILE IRR 250ML POUR (IV SOLUTION) ×2 IMPLANT
WIPE NON LINTING 3.25X3.25 (MISCELLANEOUS) ×2 IMPLANT

## 2020-04-18 NOTE — Op Note (Signed)
PREOPERATIVE DIAGNOSIS:  Nuclear sclerotic cataract of the left eye.   POSTOPERATIVE DIAGNOSIS:  Nuclear sclerotic cataract of the left eye.   OPERATIVE PROCEDURE:@   SURGEON:  Galen Manila, MD.   ANESTHESIA:  Anesthesiologist: Orrin Brigham, MD CRNA: Jennelle Human, CRNA  1.      Managed anesthesia care. 2.     0.77ml of Shugarcaine was instilled following the paracentesis   COMPLICATIONS:  None.   TECHNIQUE:   Stop and chop   DESCRIPTION OF PROCEDURE:  The patient was examined and consented in the preoperative holding area where the aforementioned topical anesthesia was applied to the left eye and then brought back to the Operating Room where the left eye was prepped and draped in the usual sterile ophthalmic fashion and a lid speculum was placed. A paracentesis was created with the side port blade and the anterior chamber was filled with viscoelastic. A near clear corneal incision was performed with the steel keratome. A continuous curvilinear capsulorrhexis was performed with a cystotome followed by the capsulorrhexis forceps. Hydrodissection and hydrodelineation were carried out with BSS on a blunt cannula. The lens was removed in a stop and chop  technique and the remaining cortical material was removed with the irrigation-aspiration handpiece. The capsular bag was inflated with viscoelastic and the Technis [id number] lens was placed in the capsular bag without complication. The remaining viscoelastic was removed from the eye with the irrigation-aspiration handpiece. The wounds were hydrated. The anterior chamber was flushed with BSS and the eye was inflated to physiologic pressure. 0.88ml Vigamox was placed in the anterior chamber. The wounds were found to be water tight. The eye was dressed with Combigan. The patient was given protective glasses to wear throughout the day and a shield with which to sleep tonight. The patient was also given drops with which to begin a drop regimen  today and will follow-up with me in one day. Implant Name Type Inv. Item Serial No. Manufacturer Lot No. LRB No. Used Action  LENS IOL DIOP 21.0 - [id number] Intraocular Lens LENS IOL DIOP 21.0 [id number] [employer]  Left 1 Implanted    Procedure(s): CATARACT EXTRACTION PHACO AND INTRAOCULAR LENS PLACEMENT (IOC) LEFT 4.78  00:32.8 (Left)  Electronically signed: Galen Manila 04/18/2020 8:36 AM

## 2020-04-18 NOTE — Anesthesia Postprocedure Evaluation (Signed)
Anesthesia Post Note  Patient: Ruth [name]  Procedure(s) Performed: CATARACT EXTRACTION PHACO AND INTRAOCULAR LENS PLACEMENT (IOC) LEFT 4.78  00:32.8 (Left Eye)     Patient location during evaluation: PACU Anesthesia Type: MAC Level of consciousness: awake and alert Pain management: pain level controlled Vital Signs Assessment: post-procedure vital signs reviewed and stable Respiratory status: spontaneous breathing, nonlabored ventilation, respiratory function stable and patient connected to nasal cannula oxygen Cardiovascular status: stable and blood pressure returned to baseline Postop Assessment: no apparent nausea or vomiting Anesthetic complications: no   No complications documented.  Fidel Levy

## 2020-04-18 NOTE — Anesthesia Procedure Notes (Signed)
Procedure Name: MAC Date/Time: 04/18/2020 8:22 AM Performed by: Nyoka Cowden, CRNA Pre-anesthesia Checklist: Patient identified, Emergency Drugs available, Suction available, Timeout performed and Patient being monitored Patient Re-evaluated:Patient Re-evaluated prior to induction Oxygen Delivery Method: Nasal cannula Placement Confirmation: positive ETCO2

## 2020-04-18 NOTE — Anesthesia Preprocedure Evaluation (Signed)
Anesthesia Evaluation  Patient identified by MRN, date of birth, ID band Patient awake    Reviewed: NPO status   History of Anesthesia Complications Negative for: history of anesthetic complications  Airway Mallampati: II  TM Distance: >3 FB Neck ROM: full    Dental  (+) Missing,  Bilat TMJ:   Pulmonary asthma (mild) , sleep apnea (no cpap) ,    Pulmonary exam normal        Cardiovascular Exercise Tolerance: Good hypertension, Normal cardiovascular exam     Neuro/Psych Neuropathy feet;  Mild Scoliosis; negative neurological ROS  negative psych ROS   GI/Hepatic Neg liver ROS, GERD  Controlled,  Endo/Other  Morbid obesity (bmi 27)  Renal/GU negative Renal ROS  negative genitourinary   Musculoskeletal  (+) Arthritis ,   Abdominal   Peds  Hematology negative hematology ROS (+)   Anesthesia Other Findings Adopted.  Covid: not done; Vaccinated 10/2019.  Reproductive/Obstetrics                             Anesthesia Physical Anesthesia Plan  ASA: II  Anesthesia Plan: MAC   Post-op Pain Management:    Induction:   PONV Risk Score and Plan: 1 and 2 and TIVA and Midazolam  Airway Management Planned:   Additional Equipment:   Intra-op Plan:   Post-operative Plan:   Informed Consent: I have reviewed the patients History and Physical, chart, labs and discussed the procedure including the risks, benefits and alternatives for the proposed anesthesia with the patient or authorized representative who has indicated his/her understanding and acceptance.       Plan Discussed with: CRNA  Anesthesia Plan Comments:         Anesthesia Quick Evaluation

## 2020-04-18 NOTE — H&P (Signed)
All labs reviewed. Abnormal studies sent to patients PCP when indicated.  Previous H&P reviewed, patient examined, there are NO CHANGES.  Ruth Abshier Porfilio7/27/20218:15 AM

## 2020-04-18 NOTE — Transfer of Care (Signed)
Immediate Anesthesia Transfer of Care Note  Patient: Ruth [name]  Procedure(s) Performed: CATARACT EXTRACTION PHACO AND INTRAOCULAR LENS PLACEMENT (IOC) LEFT 4.78  00:32.8 (Left Eye)  Patient Location: PACU  Anesthesia Type: MAC  Level of Consciousness: awake, alert  and patient cooperative  Airway and Oxygen Therapy: Patient Spontanous Breathing and Patient connected to supplemental oxygen  Post-op Assessment: Post-op Vital signs reviewed, Patient's Cardiovascular Status Stable, Respiratory Function Stable, Patent Airway and No signs of Nausea or vomiting  Post-op Vital Signs: Reviewed and stable  Complications: No complications documented.

## 2020-04-18 NOTE — Anesthesia Procedure Notes (Signed)
Procedure Name: MAC Date/Time: 04/18/2020 8:22 AM Performed by: Milah Recht A, CRNA Pre-anesthesia Checklist: Patient identified, Emergency Drugs available, Suction available, Timeout performed and Patient being monitored Patient Re-evaluated:Patient Re-evaluated prior to induction Oxygen Delivery Method: Nasal cannula Placement Confirmation: positive ETCO2       

## 2020-04-19 ENCOUNTER — Encounter: Payer: Self-pay | Admitting: Ophthalmology

## 2020-04-24 ENCOUNTER — Other Ambulatory Visit: Payer: Self-pay

## 2020-04-24 ENCOUNTER — Ambulatory Visit (INDEPENDENT_AMBULATORY_CARE_PROVIDER_SITE_OTHER): Payer: Medicare HMO | Admitting: Family Medicine

## 2020-04-24 ENCOUNTER — Encounter: Payer: Self-pay | Admitting: Family Medicine

## 2020-04-24 VITALS — BP 122/84 | HR 73 | Temp 97.6°F | Resp 14 | Ht 63.0 in | Wt 138.7 lb

## 2020-04-24 DIAGNOSIS — G4733 Obstructive sleep apnea (adult) (pediatric): Secondary | ICD-10-CM

## 2020-04-24 DIAGNOSIS — I10 Essential (primary) hypertension: Secondary | ICD-10-CM | POA: Diagnosis not present

## 2020-04-24 DIAGNOSIS — G2581 Restless legs syndrome: Secondary | ICD-10-CM | POA: Diagnosis not present

## 2020-04-24 DIAGNOSIS — G629 Polyneuropathy, unspecified: Secondary | ICD-10-CM

## 2020-04-24 DIAGNOSIS — E785 Hyperlipidemia, unspecified: Secondary | ICD-10-CM | POA: Diagnosis not present

## 2020-04-24 DIAGNOSIS — E538 Deficiency of other specified B group vitamins: Secondary | ICD-10-CM

## 2020-04-24 DIAGNOSIS — E559 Vitamin D deficiency, unspecified: Secondary | ICD-10-CM | POA: Diagnosis not present

## 2020-04-24 DIAGNOSIS — K219 Gastro-esophageal reflux disease without esophagitis: Secondary | ICD-10-CM | POA: Diagnosis not present

## 2020-04-24 DIAGNOSIS — D692 Other nonthrombocytopenic purpura: Secondary | ICD-10-CM

## 2020-04-24 MED ORDER — ROSUVASTATIN CALCIUM 5 MG PO CPSP: 1.0000 | ORAL_CAPSULE | Freq: Every evening | ORAL | 1 refills | Status: DC

## 2020-04-24 MED ORDER — AMLODIPINE BESYLATE 2.5 MG PO TABS: 2.5000 mg | ORAL_TABLET | Freq: Every day | ORAL | 1 refills | Status: DC

## 2020-04-24 MED ORDER — ROPINIROLE HCL 0.5 MG PO TABS: 0.5000 mg | ORAL_TABLET | Freq: Every day | ORAL | 1 refills | Status: DC

## 2020-04-24 MED ORDER — OMEPRAZOLE 40 MG PO CPDR: 40.0000 mg | DELAYED_RELEASE_CAPSULE | Freq: Every day | ORAL | 0 refills | Status: DC

## 2020-04-24 MED ORDER — OLMESARTAN MEDOXOMIL-HCTZ 20-12.5 MG PO TABS: 1.0000 | ORAL_TABLET | Freq: Every day | ORAL | 1 refills | Status: DC

## 2020-04-24 NOTE — Progress Notes (Signed)
Name: Ruth [name]   MRN: [id number]    DOB: 10-Feb-1946   Date:04/24/2020       Progress Note  Subjective  Chief Complaint  Chief Complaint  Patient presents with  . Follow-up  . Hypertension  . B12 Injection    HPI  OSA: she started CPAP machine in  May 2020, she was keeping it on for 6 hours, she stopped using months ago, she states she has RLS and neuropathy and has to get up during the night and is too much work to loosen her mask.   GERD: tried to stop omeprazole but unable to tolerated symptoms without medication, she states symptoms under control now, no heartburn or indigestion  RLS: she states CPAP machine has helped with symptoms of RLS, cramps improved .However she stopped using it on her own because she states she was getting up because of neuropathy, however since using gabapentin sleeping better, advised to resume OSA  Polyneuropathy: seen by Dr. Karmen Stabs the past , she states still has some nerve pains, but not as severe , she was not taking it because she was afraid of possible side effects of  gabapentin and lyrica, she spoke to [facility] - pharm D  and she resume gabapentin she states that symptoms improved, she is also back on Requip . She intermittent crawling sensation or pinching on her legs. She is no longer getting up ever night.   HTN: bp is at goal, she is compliant with Benicar,  chest pain or palpitation.No side effects of medication She has a history of paroxysmal benign vertigo but she can control it with Epley maneuver at home   Dyslipidemia:she is  not on statin therapy at this time. We started her on medications last year , last LDL was 85   Asthma: under the care of Dr. [place] Callas, she states well controlled with medication, no wheezing or SOB but she has an occasional cough. She has Breo daly now, and a rescue inhaler prn   History of cataract surgery;  Left side , no complications  Patient Active Problem List   Diagnosis Date Noted  . OSA  on CPAP 04/15/2019  . B12 deficiency 12/09/2018  . Dyslipidemia 11/08/2017  . Vitamin D deficiency 10/31/2016  . Osteopenia of left hip 10/31/2016  . 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  . BPV (benign positional vertigo) 04/17/2016  . RLS (restless legs syndrome) 04/17/2016  . GERD (gastroesophageal reflux disease) 03/11/2016  . History of hyperparathyroidism 03/11/2016  . History of gastric ulcer 03/11/2016  . Essential hypertension 04/24/2015  . Primary osteoarthritis involving multiple joints 04/24/2015  . Lumbago 04/24/2015  . Family history of colonic polyps     Past Surgical History:  Procedure Laterality Date  . CARPAL TUNNEL RELEASE Bilateral 2000  . CATARACT EXTRACTION W/PHACO Left 04/18/2020   Procedure: CATARACT EXTRACTION PHACO AND INTRAOCULAR LENS PLACEMENT (IOC) LEFT 4.78  00:32.8;  Surgeon: Galen Manila, MD;  Location: [facility];  Service: Ophthalmology;  Laterality: Left;  . COLONOSCOPY  06/17/2012   A few two mm ulcers were found in the sigmoid colon, no bleeding present,bx taken-path reporet on the few tiny ulcers ,non specific  . COLONOSCOPY WITH PROPOFOL N/A 06/11/2017   Procedure: COLONOSCOPY WITH PROPOFOL;  Surgeon: Kieth Brightly, MD;  Location: [facility];  Service: Endoscopy;  Laterality: N/A;  . parathyroid surgery  2008  . 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 TUBAL LIGATION      Family History  Adopted: Yes  Problem Relation Age of Onset  . Kidney disease Mother   . Ovarian cancer Mother   . Hypertension Father   . Stroke Father   . Lupus Sister   . Breast cancer Maternal Grandmother   . Breast cancer Cousin        pat cousins    Social History   Tobacco Use  . Smoking status: Never Smoker  . Smokeless tobacco: Never Used  Substance Use Topics  . Alcohol use: Yes    Comment: wine     Current Outpatient Medications:  .  acetaminophen (TYLENOL) 500 MG tablet, Take 1-2  tablets (500-1,000 mg total) by mouth every 8 (eight) hours as needed., Disp: 100 tablet, Rfl: 2 .  albuterol (VENTOLIN HFA) 108 (90 Base) MCG/ACT inhaler, Inhale 2 puffs into the lungs every 6 (six) hours as needed for wheezing or shortness of breath., Disp: 1 Inhaler, Rfl: 0 .  amLODipine (NORVASC) 2.5 MG tablet, Take 1 tablet by mouth once daily, Disp: 30 tablet, Rfl: 0 .  Ascorbic Acid (VITAMIN C) 1000 MG tablet, Take by mouth., Disp: , Rfl:  .  BREO ELLIPTA 200-25 MCG/INH AEPB, Take 1 puff by mouth daily., Disp: , Rfl:  .  Calcium-Vitamin D 600-200 MG-UNIT tablet, Take 1 tablet by mouth 2 (two) times daily. , Disp: , Rfl:  .  Cholecalciferol (VITAMIN D3) 1000 units CAPS, Take by mouth., Disp: , Rfl:  .  Cod Liver Oil 1000 MG CAPS, Take by mouth., Disp: , Rfl:  .  Cyanocobalamin (B-12) 2000 MCG TABS, Take 1 tablet by mouth daily., Disp: , Rfl:  .  Garlic 100 MG TABS, Take 1,000 mg by mouth. , Disp: , Rfl:  .  L-Lysine 500 MG TABS, Take by mouth., Disp: , Rfl:  .  levocetirizine (XYZAL) 5 MG tablet, Take 1 tablet (5 mg total) by mouth every evening., Disp: 30 tablet, Rfl: 0 .  magnesium oxide (MAG-OX) 400 MG tablet, Take 500 mg by mouth daily. , Disp: , Rfl:  .  montelukast (SINGULAIR) 10 MG tablet, Take 1 tablet (10 mg total) by mouth at bedtime., Disp: 90 tablet, Rfl: 1 .  Nutritional Supplements (JOINT FORMULA PO), Take 2 tablets by mouth daily. glucosamine, Disp: , Rfl:  .  olmesartan-hydrochlorothiazide (BENICAR HCT) 20-12.5 MG tablet, Take 1 tablet by mouth daily., Disp: 90 tablet, Rfl: 1 .  omeprazole (PRILOSEC) 40 MG capsule, Take 1 capsule by mouth once daily, Disp: 90 capsule, Rfl: 0 .  Potassium Gluconate 2.5 MEQ TABS, Take 2 tablets by mouth. , Disp: , Rfl:  .  rOPINIRole (REQUIP) 0.5 MG tablet, Take 0.5 mg by mouth at bedtime., Disp: , Rfl:  .  Rosuvastatin Calcium 5 MG CPSP, Take 1 tablet by mouth every evening., Disp: 90 capsule, Rfl: 1 .  Zinc 22.5 MG TABS, Take by mouth daily.  (Patient not taking: Reported on 04/06/2020), Disp: , Rfl:   Current Facility-Administered Medications:  .  cyanocobalamin ((VITAMIN B-12)) injection 1,000 mcg, 1,000 mcg, Intramuscular, Q30 days, Alba Cory, MD, 1,000 mcg at 04/24/20 1029  Allergies  Allergen Reactions  . Ace Inhibitors Cough  . Aspirin Nausea Only    Abdominal pain    I personally reviewed active problem list, medication list, allergies, family history, social history, health maintenance with the patient/caregiver today.   ROS  Constitutional: Negative for fever or weight change.  Respiratory: Negative for cough and shortness of breath.   Cardiovascular: Negative for chest pain or palpitations.  Gastrointestinal: Negative for abdominal pain, no bowel changes.  Musculoskeletal: Negative for gait problem or joint swelling.  Skin: Negative for rash.  Neurological: Negative for dizziness or headache.  No other specific complaints in a complete review of systems (except as listed in HPI above).  Objective  Vitals:   04/24/20 1031  BP: 122/84  Pulse: 73  Resp: 14  Temp: 97.6 F (36.4 C)  TempSrc: Temporal  SpO2: 99%  Weight: 138 lb 11.2 oz (62.9 kg)  Height: 5\' 3"  (1.6 m)    Body mass index is 24.57 kg/m.  Physical Exam  Constitutional: Patient appears well-developed and well-nourished. No distress.  HEENT: head atraumatic, normocephalic, pupils equal and reactive to light,  neck supple Cardiovascular: Normal rate, regular rhythm and normal heart sounds.  No murmur heard. No BLE edema. Pulmonary/Chest: Effort normal and breath sounds normal. No respiratory distress. Abdominal: Soft.  There is no tenderness. Skin: senile purpura right arm Psychiatric: Patient has a normal mood and affect. behavior is normal. Judgment and thought content normal.  PHQ2/9: Depression screen [facility] 2/9 04/24/2020 11/25/2019 10/18/2019 04/15/2019 11/19/2018  Decreased Interest 0 0 0 0 0  Down, Depressed, Hopeless 0 0 0 0 0   PHQ - 2 Score 0 0 0 0 0  Altered sleeping 0 - 0 0 -  Tired, decreased energy 0 - 0 0 -  Change in appetite 0 - 0 0 -  Feeling bad or failure about yourself  0 - 0 0 -  Trouble concentrating 0 - 0 0 -  Moving slowly or fidgety/restless 0 - 0 0 -  Suicidal thoughts 0 - 0 0 -  PHQ-9 Score 0 - 0 0 -  Difficult doing work/chores - - Not difficult at all Not difficult at all -    phq 9 is negative   Fall Risk: Fall Risk  04/24/2020 11/25/2019 10/18/2019 11/19/2018 09/14/2018  Falls in the past year? 0 0 0 0 0  Number falls in past yr: 0 0 0 0 0  Injury with Fall? 0 0 0 0 0  Risk for fall due to : - No Fall Risks - - -  Follow up - Falls prevention discussed - Falls prevention discussed -     Functional Status Survey: Is the patient deaf or have difficulty hearing?: No Does the patient have difficulty seeing, even when wearing glasses/contacts?: No Does the patient have difficulty concentrating, remembering, or making decisions?: No Does the patient have difficulty walking or climbing stairs?: No Does the patient have difficulty dressing or bathing?: No Does the patient have difficulty doing errands alone such as visiting a doctor's office or shopping?: No    Assessment & Plan  1. Dyslipidemia  - Rosuvastatin Calcium 5 MG CPSP; Take 1 tablet by mouth every evening.  Dispense: 90 capsule; Refill: 1  2. B12 deficiency  B12 today   3. Vitamin D deficiency   4. OSA    5. Gastroesophageal reflux disease without esophagitis  - omeprazole (PRILOSEC) 40 MG capsule; Take 1 capsule (40 mg total) by mouth daily.  Dispense: 90 capsule; Refill: 0  6. Peripheral polyneuropathy  She is back on gabapentin   7. Essential hypertension  At goal  - amLODipine (NORVASC) 2.5 MG tablet; Take 1 tablet (2.5 mg total) by mouth daily.  Dispense: 90 tablet; Refill: 1 - olmesartan-hydrochlorothiazide (BENICAR HCT) 20-12.5 MG tablet; Take 1 tablet by mouth daily.  Dispense: 90 tablet; Refill:  1  8. RLS (restless legs syndrome)  -  rOPINIRole (REQUIP) 0.5 MG tablet; Take 1 tablet (0.5 mg total) by mouth at bedtime.  Dispense: 90 tablet; Refill: 1   9. Senile purpura (HCC)

## 2020-04-28 DIAGNOSIS — H2511 Age-related nuclear cataract, right eye: Secondary | ICD-10-CM | POA: Diagnosis not present

## 2020-05-01 DIAGNOSIS — M5033 Other cervical disc degeneration, cervicothoracic region: Secondary | ICD-10-CM | POA: Diagnosis not present

## 2020-05-01 DIAGNOSIS — M9903 Segmental and somatic dysfunction of lumbar region: Secondary | ICD-10-CM | POA: Diagnosis not present

## 2020-05-01 DIAGNOSIS — R519 Headache, unspecified: Secondary | ICD-10-CM | POA: Diagnosis not present

## 2020-05-01 DIAGNOSIS — M9901 Segmental and somatic dysfunction of cervical region: Secondary | ICD-10-CM | POA: Diagnosis not present

## 2020-05-02 ENCOUNTER — Other Ambulatory Visit: Payer: Self-pay

## 2020-05-02 ENCOUNTER — Other Ambulatory Visit: Payer: Self-pay | Admitting: Family Medicine

## 2020-05-02 ENCOUNTER — Encounter: Payer: Self-pay | Admitting: Ophthalmology

## 2020-05-02 DIAGNOSIS — Z1231 Encounter for screening mammogram for malignant neoplasm of breast: Secondary | ICD-10-CM

## 2020-05-05 ENCOUNTER — Other Ambulatory Visit: Payer: Self-pay

## 2020-05-05 ENCOUNTER — Other Ambulatory Visit
Admission: RE | Admit: 2020-05-05 | Discharge: 2020-05-05 | Disposition: A | Discharge status: Home / Self Care | Payer: Medicare HMO | Source: Ambulatory Visit | Attending: Pediatric Dentistry | Admitting: Pediatric Dentistry

## 2020-05-05 DIAGNOSIS — Z01812 Encounter for preprocedural laboratory examination: Secondary | ICD-10-CM | POA: Diagnosis not present

## 2020-05-05 DIAGNOSIS — Z20822 Contact with and (suspected) exposure to covid-19: Secondary | ICD-10-CM | POA: Diagnosis not present

## 2020-05-05 NOTE — Discharge Instructions (Signed)

## 2020-05-06 LAB — SARS CORONAVIRUS 2 (TAT 6-24 HRS): SARS Coronavirus 2: NEGATIVE

## 2020-05-09 ENCOUNTER — Ambulatory Visit
Admission: RE | Admit: 2020-05-09 | Discharge: 2020-05-09 | Disposition: A | Discharge status: Home / Self Care | Payer: Medicare HMO | Attending: Ophthalmology | Admitting: Ophthalmology

## 2020-05-09 ENCOUNTER — Encounter
Admission: RE | Disposition: A | Discharge status: Home / Self Care | Payer: Self-pay | Source: Home / Self Care | Attending: Ophthalmology

## 2020-05-09 ENCOUNTER — Other Ambulatory Visit: Payer: Self-pay

## 2020-05-09 ENCOUNTER — Encounter: Payer: Self-pay | Admitting: Ophthalmology

## 2020-05-09 ENCOUNTER — Ambulatory Visit: Payer: Medicare HMO | Admitting: Anesthesiology

## 2020-05-09 DIAGNOSIS — Z79899 Other long term (current) drug therapy: Secondary | ICD-10-CM | POA: Insufficient documentation

## 2020-05-09 DIAGNOSIS — G4730 Sleep apnea, unspecified: Secondary | ICD-10-CM | POA: Insufficient documentation

## 2020-05-09 DIAGNOSIS — I10 Essential (primary) hypertension: Secondary | ICD-10-CM | POA: Diagnosis not present

## 2020-05-09 DIAGNOSIS — K219 Gastro-esophageal reflux disease without esophagitis: Secondary | ICD-10-CM | POA: Diagnosis not present

## 2020-05-09 DIAGNOSIS — G629 Polyneuropathy, unspecified: Secondary | ICD-10-CM | POA: Insufficient documentation

## 2020-05-09 DIAGNOSIS — H2511 Age-related nuclear cataract, right eye: Secondary | ICD-10-CM | POA: Diagnosis not present

## 2020-05-09 DIAGNOSIS — Z6827 Body mass index (BMI) 27.0-27.9, adult: Secondary | ICD-10-CM | POA: Insufficient documentation

## 2020-05-09 DIAGNOSIS — H25811 Combined forms of age-related cataract, right eye: Secondary | ICD-10-CM | POA: Diagnosis not present

## 2020-05-09 DIAGNOSIS — J45909 Unspecified asthma, uncomplicated: Secondary | ICD-10-CM | POA: Diagnosis not present

## 2020-05-09 HISTORY — PX: CATARACT EXTRACTION W/PHACO: SHX586

## 2020-05-09 SURGERY — PHACOEMULSIFICATION, CATARACT, WITH IOL INSERTION
Anesthesia: Monitor Anesthesia Care | Site: Eye | Laterality: Right

## 2020-05-09 MED ORDER — EPINEPHRINE PF 1 MG/ML IJ SOLN
INTRAOCULAR | Status: DC | PRN
  Administered 2020-05-09: 38 mL via OPHTHALMIC

## 2020-05-09 MED ORDER — ONDANSETRON HCL 4 MG/2ML IJ SOLN: 4.0000 mg | Freq: Once | INTRAMUSCULAR | Status: DC | PRN

## 2020-05-09 MED ORDER — NA CHONDROIT SULF-NA HYALURON 40-17 MG/ML IO SOLN
INTRAOCULAR | Status: DC | PRN
  Administered 2020-05-09: 1 mL via INTRAOCULAR

## 2020-05-09 MED ORDER — LIDOCAINE HCL (PF) 2 % IJ SOLN
INTRAOCULAR | Status: DC | PRN
  Administered 2020-05-09: 2 mL

## 2020-05-09 MED ORDER — ARMC OPHTHALMIC DILATING DROPS
1.0000 "application " | OPHTHALMIC | Status: DC | PRN
  Administered 2020-05-09 (×3): 1 via OPHTHALMIC

## 2020-05-09 MED ORDER — TETRACAINE HCL 0.5 % OP SOLN
1.0000 [drp] | OPHTHALMIC | Status: DC | PRN
  Administered 2020-05-09 (×3): 1 [drp] via OPHTHALMIC

## 2020-05-09 MED ORDER — FENTANYL CITRATE (PF) 100 MCG/2ML IJ SOLN
INTRAMUSCULAR | Status: DC | PRN
  Administered 2020-05-09: 100 ug via INTRAVENOUS

## 2020-05-09 MED ORDER — ACETAMINOPHEN 325 MG PO TABS: 325.0000 mg | ORAL_TABLET | ORAL | Status: DC | PRN

## 2020-05-09 MED ORDER — MOXIFLOXACIN HCL 0.5 % OP SOLN
OPHTHALMIC | Status: DC | PRN
  Administered 2020-05-09: 0.2 mL via OPHTHALMIC

## 2020-05-09 MED ORDER — BRIMONIDINE TARTRATE-TIMOLOL 0.2-0.5 % OP SOLN
OPHTHALMIC | Status: DC | PRN
  Administered 2020-05-09: 1 [drp] via OPHTHALMIC

## 2020-05-09 MED ORDER — MIDAZOLAM HCL 2 MG/2ML IJ SOLN
INTRAMUSCULAR | Status: DC | PRN
  Administered 2020-05-09: 2 mg via INTRAVENOUS

## 2020-05-09 MED ORDER — LACTATED RINGERS IV SOLN: INTRAVENOUS | Status: DC

## 2020-05-09 MED ORDER — ACETAMINOPHEN 160 MG/5ML PO SOLN: 325.0000 mg | ORAL | Status: DC | PRN

## 2020-05-09 SURGICAL SUPPLY — 20 items
CANNULA ANT/CHMB 27G (MISCELLANEOUS) ×2 IMPLANT
CANNULA ANT/CHMB 27GA (MISCELLANEOUS) ×4 IMPLANT
GLOVE SURG LX 8.0 MICRO (GLOVE) ×1
GLOVE SURG LX STRL 8.0 MICRO (GLOVE) ×1 IMPLANT
GLOVE SURG TRIUMPH 8.0 PF LTX (GLOVE) ×2 IMPLANT
GOWN STRL REUS W/ TWL LRG LVL3 (GOWN DISPOSABLE) ×2 IMPLANT
GOWN STRL REUS W/TWL LRG LVL3 (GOWN DISPOSABLE) ×4
LENS IOL DIOP 22.5 (Intraocular Lens) ×2 IMPLANT
LENS IOL TECNIS MONO 22.5 (Intraocular Lens) IMPLANT
MARKER SKIN DUAL TIP RULER LAB (MISCELLANEOUS) ×2 IMPLANT
NDL FILTER BLUNT 18X1 1/2 (NEEDLE) ×1 IMPLANT
NEEDLE FILTER BLUNT 18X 1/2SAF (NEEDLE) ×1
NEEDLE FILTER BLUNT 18X1 1/2 (NEEDLE) ×1 IMPLANT
PACK EYE AFTER SURG (MISCELLANEOUS) ×2 IMPLANT
PACK OPTHALMIC (MISCELLANEOUS) ×2 IMPLANT
PACK PORFILIO (MISCELLANEOUS) ×2 IMPLANT
SYR 3ML LL SCALE MARK (SYRINGE) ×2 IMPLANT
SYR TB 1ML LUER SLIP (SYRINGE) ×2 IMPLANT
WATER STERILE IRR 250ML POUR (IV SOLUTION) ×2 IMPLANT
WIPE NON LINTING 3.25X3.25 (MISCELLANEOUS) ×2 IMPLANT

## 2020-05-09 NOTE — Anesthesia Procedure Notes (Signed)
Procedure Name: MAC Date/Time: 05/09/2020 10:24 AM Performed by: Silvana Newness, CRNA Pre-anesthesia Checklist: Patient identified, Emergency Drugs available, Suction available, Patient being monitored and Timeout performed Patient Re-evaluated:Patient Re-evaluated prior to induction Oxygen Delivery Method: Nasal cannula Placement Confirmation: positive ETCO2

## 2020-05-09 NOTE — Anesthesia Preprocedure Evaluation (Signed)
Anesthesia Evaluation  Patient identified by MRN, date of birth, ID band Patient awake    Reviewed: NPO status   History of Anesthesia Complications Negative for: history of anesthetic complications  Airway Mallampati: II  TM Distance: >3 FB Neck ROM: full    Dental  (+) Missing,  Bilat TMJ:   Pulmonary asthma (mild) , sleep apnea (no cpap) ,    Pulmonary exam normal breath sounds clear to auscultation       Cardiovascular Exercise Tolerance: Good hypertension, Normal cardiovascular exam Rhythm:Regular Rate:Normal     Neuro/Psych Neuropathy feet;  Mild Scoliosis; negative neurological ROS  negative psych ROS   GI/Hepatic Neg liver ROS, GERD  Controlled,  Endo/Other  Morbid obesity (bmi 27)  Renal/GU negative Renal ROS  negative genitourinary   Musculoskeletal  (+) Arthritis ,   Abdominal Normal abdominal exam  (+) - obese,   Peds  Hematology negative hematology ROS (+)   Anesthesia Other Findings Adopted.  Covid: not done; Vaccinated 10/2019.  Reproductive/Obstetrics                             Anesthesia Physical  Anesthesia Plan  ASA: III  Anesthesia Plan: MAC   Post-op Pain Management:    Induction:   PONV Risk Score and Plan: 1 and 2 and TIVA, Midazolam and Treatment may vary due to age or medical condition  Airway Management Planned: Nasal Cannula  Additional Equipment:   Intra-op Plan:   Post-operative Plan:   Informed Consent: I have reviewed the patients History and Physical, chart, labs and discussed the procedure including the risks, benefits and alternatives for the proposed anesthesia with the patient or authorized representative who has indicated his/her understanding and acceptance.     Dental advisory given  Plan Discussed with: CRNA  Anesthesia Plan Comments:         Anesthesia Quick Evaluation  Patient Active Problem List   Diagnosis  Date Noted  . OSA on CPAP 04/15/2019  . B12 deficiency 12/09/2018  . Dyslipidemia 11/08/2017  . Vitamin D deficiency 10/31/2016  . Osteopenia of left hip 10/31/2016  . 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  . BPV (benign positional vertigo) 04/17/2016  . RLS (restless legs syndrome) 04/17/2016  . GERD (gastroesophageal reflux disease) 03/11/2016  . History of hyperparathyroidism 03/11/2016  . History of gastric ulcer 03/11/2016  . Essential hypertension 04/24/2015  . Primary osteoarthritis involving multiple joints 04/24/2015  . Lumbago 04/24/2015  . Family history of colonic polyps     CBC Latest Ref Rng & Units 10/18/2019 11/12/2018 09/14/2018  WBC 3.8 - 10.8 Thousand/uL 7.7 8.5 8.6  Hemoglobin 11.7 - 15.5 g/dL 13.7 14.1 13.4  Hematocrit 35 - 45 % 40.3 39.9 39.0  Platelets 140 - 400 Thousand/uL 281 334 294   BMP Latest Ref Rng & Units 10/18/2019 11/12/2018 11/05/2017  Glucose 65 - 99 mg/dL 82 74 83  BUN 7 - 25 mg/dL _0 Creatinine 0.60 - 0.93 mg/dL 0.73 0.86 0.81  BUN/Creat Ratio 6 - 22 (calc) NOT APPLICABLE NOT APPLICABLE NOT APPLICABLE  Sodium 703 - 146 mmol/L 141 142 140  Potassium 3.5 - 5.3 mmol/L 3.7 3.6 3.7  Chloride 98 - 110 mmol/L 105 102 101  CO2 20 - 32 mmol/L _1 Calcium 8.6 - 10.4 mg/dL 10.3 10.2 10.4    Risks and benefits of anesthesia discussed at length, patient or surrogate demonstrates understanding. Appropriately NPO. Plan to proceed with  anesthesia.  Champ Mungo, MD 05/09/20

## 2020-05-09 NOTE — Anesthesia Postprocedure Evaluation (Signed)
Anesthesia Post Note  Patient: Ruth [name]  Procedure(s) Performed: CATARACT EXTRACTION PHACO AND INTRAOCULAR LENS PLACEMENT (IOC) RIGHT 3.41 00:21.8 (Right Eye)     Patient location during evaluation: PACU Anesthesia Type: MAC Level of consciousness: awake and alert Pain management: pain level controlled Vital Signs Assessment: post-procedure vital signs reviewed and stable Respiratory status: spontaneous breathing, nonlabored ventilation, respiratory function stable and patient connected to nasal cannula oxygen Cardiovascular status: stable and blood pressure returned to baseline Postop Assessment: no apparent nausea or vomiting Anesthetic complications: no   No complications documented.  Sinda Du

## 2020-05-09 NOTE — Transfer of Care (Signed)
Immediate Anesthesia Transfer of Care Note  Patient: Ruth [name]  Procedure(s) Performed: CATARACT EXTRACTION PHACO AND INTRAOCULAR LENS PLACEMENT (IOC) RIGHT 3.41 00:21.8 (Right Eye)  Patient Location: PACU  Anesthesia Type: MAC  Level of Consciousness: awake, alert  and patient cooperative  Airway and Oxygen Therapy: Patient Spontanous Breathing and Patient connected to supplemental oxygen  Post-op Assessment: Post-op Vital signs reviewed, Patient's Cardiovascular Status Stable, Respiratory Function Stable, Patent Airway and No signs of Nausea or vomiting  Post-op Vital Signs: Reviewed and stable  Complications: No complications documented.

## 2020-05-09 NOTE — Op Note (Signed)
PREOPERATIVE DIAGNOSIS:  Nuclear sclerotic cataract of the right eye.   POSTOPERATIVE DIAGNOSIS:  H25.11 Cataract   OPERATIVE PROCEDURE:@   SURGEON:  Galen Manila, MD.   ANESTHESIA:  Anesthesiologist: Edwyna Ready, MD CRNA: Michaele Offer, CRNA  1.      Managed anesthesia care. 2.      0.24ml of Shugarcaine was instilled in the eye following the paracentesis.   COMPLICATIONS:  None.   TECHNIQUE:   Stop and chop   DESCRIPTION OF PROCEDURE:  The patient was examined and consented in the preoperative holding area where the aforementioned topical anesthesia was applied to the right eye and then brought back to the Operating Room where the right eye was prepped and draped in the usual sterile ophthalmic fashion and a lid speculum was placed. A paracentesis was created with the side port blade and the anterior chamber was filled with viscoelastic. A near clear corneal incision was performed with the steel keratome. A continuous curvilinear capsulorrhexis was performed with a cystotome followed by the capsulorrhexis forceps. Hydrodissection and hydrodelineation were carried out with BSS on a blunt cannula. The lens was removed in a stop and chop  technique and the remaining cortical material was removed with the irrigation-aspiration handpiece. The capsular bag was inflated with viscoelastic and the Technis [id number]  lens was placed in the capsular bag without complication. The remaining viscoelastic was removed from the eye with the irrigation-aspiration handpiece. The wounds were hydrated. The anterior chamber was flushed with BSS and the eye was inflated to physiologic pressure. 0.45ml of Vigamox was placed in the anterior chamber. The wounds were found to be water tight. The eye was dressed with Combigan. The patient was given protective glasses to wear throughout the day and a shield with which to sleep tonight. The patient was also given drops with which to begin a drop regimen today and will  follow-up with me in one day. Implant Name Type Inv. Item Serial No. Manufacturer Lot No. LRB No. Used Action  LENS IOL DIOP 22.5 - [id number] Intraocular Lens LENS IOL DIOP 22.5 [id number] [employer]  Right 1 Implanted   Procedure(s): CATARACT EXTRACTION PHACO AND INTRAOCULAR LENS PLACEMENT (IOC) RIGHT 3.41 00:21.8 (Right)  Electronically signed: Galen Manila 05/09/2020 10:36 AM

## 2020-05-09 NOTE — H&P (Signed)
All labs reviewed. Abnormal studies sent to patients PCP when indicated.  Previous H&P reviewed, patient examined, there are NO CHANGES.  Ruth Sowle Porfilio8/17/202110:13 AM

## 2020-05-10 ENCOUNTER — Encounter: Payer: Self-pay | Admitting: Ophthalmology

## 2020-05-17 ENCOUNTER — Ambulatory Visit: Payer: Self-pay

## 2020-05-17 NOTE — Telephone Encounter (Signed)
Pt. Reports she has had dizziness "for several weeks.Seems to happening more often. Pt. Reports she started taking Pregabalin in August. Started getting worse after she started taking it. Medication is not on medication list. States she never started it when "it was prescribed, but Felicity Pellegrini that works with your office said I could take it. I'm afraid to stop it all of a sudden." Offered appointment, but pt. Declines. Requests  Dr. Carlynn Purl let her know what she thinks. Please advise pt.  MW 1  Ruth [name]. Nichter Female, 74 y.o., 1946-01-10 MRN:  [id number] Phone:  [phone] [name]) PCP:  Alba Cory, MD Coverage:  Monia Pouch Medicare/[employer] Medicare Hmo/Ppo Next Appt With Internal Medicine 05/25/2020 at 9:00 AM Message from Elliot Gault sent at 05/17/2020 10:51 AM EDT  Summary: Medication Management   Patient states medication for prescription drugs have increased and unsure if gabapentin is causing her dizziness, please advise         Call History   Type Contact Phone  05/17/2020 10:51 AM EDT Phone ([address]) Ruth [name], Ruth [name] (Self) [phone] [name])  User: Elliot Gault    Answer Assessment - Initial Assessment Questions 1. DESCRIPTION: "Describe your dizziness."     Dizzy 2. LIGHTHEADED: "Do you feel lightheaded?" (e.g., somewhat faint, woozy, weak upon standing)     Yes 3. VERTIGO: "Do you feel like either you or the room is spinning or tilting?" (i.e. vertigo)     No 4. SEVERITY: "How bad is it?"  "Do you feel like you are going to faint?" "Can you stand and walk?"   - MILD: Feels slightly dizzy, but walking normally.   - MODERATE: Feels very unsteady when walking, but not falling; interferes with normal activities (e.g., school, work) .   - SEVERE: Unable to walk without falling, or requires assistance to walk without falling; feels like passing out now.      Moderate 5. ONSET:  "When did the dizziness begin?"     Weeks ago 6. AGGRAVATING FACTORS: "Does anything make it worse?"  (e.g., standing, change in head position)     Maybe my medicines 7. HEART RATE: "Can you tell me your heart rate?" "How many beats in 15 seconds?"  (Note: not all patients can do this)       No 8. CAUSE: "What do you think is causing the dizziness?"     Medicines 9. RECURRENT SYMPTOM: "Have you had dizziness before?" If Yes, ask: "When was the last time?" "What happened that time?"     Yes 10. OTHER SYMPTOMS: "Do you have any other symptoms?" (e.g., fever, chest pain, vomiting, diarrhea, bleeding)       No 11. PREGNANCY: "Is there any chance you are pregnant?" "When was your last menstrual period?"       No  Protocols used: DIZZINESS [facility]

## 2020-05-18 NOTE — Telephone Encounter (Signed)
Patient states that she is doing a little better. Just feeling tired all the time. Dizziness has resolved. She is unsure if the Pregabalin was the cause. The dizziness started around the time she had cataract surgery. She thinks that the glasses may have caused her dizziness. She went all day yesterday without glasses and she did not have any dizziness. She is going to do without her glasses until her eyes adjust and see if that is the cause. She will continue to take the Pregabalin.

## 2020-05-25 ENCOUNTER — Ambulatory Visit (INDEPENDENT_AMBULATORY_CARE_PROVIDER_SITE_OTHER): Payer: Medicare HMO

## 2020-05-25 ENCOUNTER — Other Ambulatory Visit: Payer: Self-pay

## 2020-05-25 DIAGNOSIS — Z23 Encounter for immunization: Secondary | ICD-10-CM

## 2020-05-25 DIAGNOSIS — E538 Deficiency of other specified B group vitamins: Secondary | ICD-10-CM

## 2020-05-25 MED ORDER — CYANOCOBALAMIN 1000 MCG/ML IJ SOLN
1000.0000 ug | Freq: Once | INTRAMUSCULAR | Status: AC
  Administered 2020-05-25: 1000 ug via INTRAMUSCULAR

## 2020-05-30 DIAGNOSIS — M5033 Other cervical disc degeneration, cervicothoracic region: Secondary | ICD-10-CM | POA: Diagnosis not present

## 2020-05-30 DIAGNOSIS — M9903 Segmental and somatic dysfunction of lumbar region: Secondary | ICD-10-CM | POA: Diagnosis not present

## 2020-05-30 DIAGNOSIS — M9901 Segmental and somatic dysfunction of cervical region: Secondary | ICD-10-CM | POA: Diagnosis not present

## 2020-05-30 DIAGNOSIS — R519 Headache, unspecified: Secondary | ICD-10-CM | POA: Diagnosis not present

## 2020-06-09 ENCOUNTER — Telehealth: Payer: Self-pay

## 2020-06-09 NOTE — Progress Notes (Signed)
What problems have you recently had with your health? Cataract extraction-04/18/2020 04/24/2020- ([facility]-PCP)-Stopped using CPAP but encouraged to restart;pt reported herself to be on gabapentin and rosuvastatin Cataract extraction-05/09/2020 05/17/2020-reported dizziness to nurse triage but unsure if it was glasses or pregabalin What recent interventions/DTPs have been made by any provider since last CPP Visit?pt reports to be on gabapentin to her PCP however told PharmD that she was not taking. During review of medications for onboard to [facility] patient was discovered to have an old bottle pregabalin that was very expired that she was occasionally taking. Patient was advised to properly dispose of expired medication. Patient was also discovered to be taken a daily Vitamin C tablet. Per Felicity Pellegrini, PharmD, note on 04/06/2020 reminded patient that daily Vitamin C tablet was a suspected cause of her unresolved GERD and that was suppose to stop taking it daily. She removed it from her prescription containers and will be going on compliance packaging to help remember to not take it daily. Any recent hospitalizations or ED visits since last visit with CPP?No What problems have you had recently with your pharmacy? None Are they delivering your meds? No Are they being delivered on the same day? No What problems have you experienced getting refills?[facility] does not ask provider for refills when patient is out of medication prior to patient requesting next fill of medication.  Are you having to go without any meds due to price?No, however Earlie Server is a strain on finances What issues are side effects are you having with your medications? Patient does not believe so but is very concerned regarding being on medications too long and causing harm to her body. What would you like me to pass along to [facility] for them to help you with?"Can Ted ensure that my Vitamin D supplement and Vitamin B12 supplement at  [facility] is correct? I feel like I should be taking two of my Vitamin D3 supplements." Patient also reports GERD symptoms not well controlled with current omeprazole regimen.  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 She states that even the tiniest sliver of sweets sets off the neuropathy.   Patient seemed very concerned about side effects of medications especially in setting of being on medication too long. Reassurance given that providers way risks and benefits of all treatments in order to make the call that best fits the individuals need. Cautioned patient that starting and stopping prescribed treatments can often be worse overall.   Patient does report her blood pressure is good. States that as an [employer] in her church they require all patrons to have blood pressure taken before they may attend service. She  Is not exercising often due to her pain in her feet. She has not been fishing as much lately because it hurts to much to go where they need to go fishing at.   Patient did on board to [facility] pharmacy compliance packaging while on phone. All medications that she was currently taking were reviewed along with timing. She would like to add her supplements into the packages as well, after the correct dosing is confirmed.

## 2020-06-15 ENCOUNTER — Other Ambulatory Visit: Payer: Self-pay

## 2020-06-15 DIAGNOSIS — I10 Essential (primary) hypertension: Secondary | ICD-10-CM

## 2020-06-15 DIAGNOSIS — G2581 Restless legs syndrome: Secondary | ICD-10-CM

## 2020-06-15 DIAGNOSIS — R059 Cough, unspecified: Secondary | ICD-10-CM

## 2020-06-15 DIAGNOSIS — E785 Hyperlipidemia, unspecified: Secondary | ICD-10-CM

## 2020-06-15 DIAGNOSIS — J45909 Unspecified asthma, uncomplicated: Secondary | ICD-10-CM

## 2020-06-15 DIAGNOSIS — K219 Gastro-esophageal reflux disease without esophagitis: Secondary | ICD-10-CM

## 2020-06-15 MED ORDER — AMLODIPINE BESYLATE 2.5 MG PO TABS: 2.5000 mg | ORAL_TABLET | Freq: Every day | ORAL | 1 refills | Status: DC

## 2020-06-15 MED ORDER — MONTELUKAST SODIUM 10 MG PO TABS: 10.0000 mg | ORAL_TABLET | Freq: Every day | ORAL | 1 refills | Status: DC

## 2020-06-15 MED ORDER — LEVOCETIRIZINE DIHYDROCHLORIDE 5 MG PO TABS: 5.0000 mg | ORAL_TABLET | Freq: Every evening | ORAL | 0 refills | Status: DC

## 2020-06-15 MED ORDER — OLMESARTAN MEDOXOMIL-HCTZ 20-12.5 MG PO TABS: 1.0000 | ORAL_TABLET | Freq: Every day | ORAL | 1 refills | Status: DC

## 2020-06-15 MED ORDER — ROSUVASTATIN CALCIUM 5 MG PO CPSP: 1.0000 | ORAL_CAPSULE | Freq: Every evening | ORAL | 1 refills | Status: DC

## 2020-06-15 MED ORDER — ALBUTEROL SULFATE HFA 108 (90 BASE) MCG/ACT IN AERS: 2.0000 | INHALATION_SPRAY | Freq: Four times a day (QID) | RESPIRATORY_TRACT | 0 refills | Status: DC | PRN

## 2020-06-15 MED ORDER — ROPINIROLE HCL 0.5 MG PO TABS: 0.5000 mg | ORAL_TABLET | Freq: Every day | ORAL | 1 refills | Status: DC

## 2020-06-15 MED ORDER — OMEPRAZOLE 40 MG PO CPDR: 40.0000 mg | DELAYED_RELEASE_CAPSULE | Freq: Every day | ORAL | 0 refills | Status: DC

## 2020-06-22 ENCOUNTER — Ambulatory Visit: Payer: Medicare HMO

## 2020-06-26 ENCOUNTER — Ambulatory Visit (INDEPENDENT_AMBULATORY_CARE_PROVIDER_SITE_OTHER): Payer: Medicare HMO

## 2020-06-26 ENCOUNTER — Other Ambulatory Visit: Payer: Self-pay

## 2020-06-26 DIAGNOSIS — E538 Deficiency of other specified B group vitamins: Secondary | ICD-10-CM | POA: Diagnosis not present

## 2020-06-26 MED ORDER — CYANOCOBALAMIN 1000 MCG/ML IJ SOLN
1000.0000 ug | Freq: Once | INTRAMUSCULAR | Status: AC
  Administered 2020-06-26: 1000 ug via INTRAMUSCULAR

## 2020-06-27 ENCOUNTER — Ambulatory Visit: Payer: Self-pay

## 2020-06-28 ENCOUNTER — Ambulatory Visit: Payer: Medicare HMO | Admitting: Pharmacist

## 2020-06-28 ENCOUNTER — Other Ambulatory Visit: Payer: Self-pay

## 2020-06-28 DIAGNOSIS — R519 Headache, unspecified: Secondary | ICD-10-CM | POA: Diagnosis not present

## 2020-06-28 DIAGNOSIS — M5033 Other cervical disc degeneration, cervicothoracic region: Secondary | ICD-10-CM | POA: Diagnosis not present

## 2020-06-28 DIAGNOSIS — M9903 Segmental and somatic dysfunction of lumbar region: Secondary | ICD-10-CM | POA: Diagnosis not present

## 2020-06-28 DIAGNOSIS — E785 Hyperlipidemia, unspecified: Secondary | ICD-10-CM

## 2020-06-28 DIAGNOSIS — I10 Essential (primary) hypertension: Secondary | ICD-10-CM

## 2020-06-28 DIAGNOSIS — M9901 Segmental and somatic dysfunction of cervical region: Secondary | ICD-10-CM | POA: Diagnosis not present

## 2020-06-28 NOTE — Chronic Care Management (AMB) (Signed)
Chronic Care Management Pharmacy  Name: Ruth [name]  MRN: [id number] DOB: 1945-10-01  Chief Complaint/ HPI  Ruth [name],  74 y.o. , female presents for their Follow-Up CCM visit with the clinical pharmacist In office.  PCP : Alba Cory, MD  Their chronic conditions include: HTN, HLD, bronchitis  Office Visits:NA  Consult Visit:NA  Medications: Outpatient Encounter Medications as of 06/28/2020  Medication Sig Note  . acetaminophen (TYLENOL) 500 MG tablet Take 1-2 tablets (500-1,000 mg total) by mouth every 8 (eight) hours as needed.   Marland Kitchen albuterol (VENTOLIN HFA) 108 (90 Base) MCG/ACT inhaler Inhale 2 puffs into the lungs every 6 (six) hours as needed for wheezing or shortness of breath.   Marland Kitchen amLODipine (NORVASC) 2.5 MG tablet Take 1 tablet (2.5 mg total) by mouth daily.   . Ascorbic Acid (VITAMIN C) 1000 MG tablet Take by mouth. 03/11/2016: Received from: [facility]  . BREO ELLIPTA 200-25 MCG/INH AEPB Take 1 puff by mouth daily.   . Calcium-Vitamin D 600-200 MG-UNIT tablet Take 1 tablet by mouth 2 (two) times daily.  03/11/2016: Received from: [facility]  . Cholecalciferol (VITAMIN D3) 1000 units CAPS Take by mouth. 03/11/2016: Received from: [facility]  . Cod Liver Oil 1000 MG CAPS Take by mouth.   . Cyanocobalamin (B-12) 2000 MCG TABS Take 1 tablet by mouth daily.   . Garlic 100 MG TABS Take 1,000 mg by mouth.    . L-Lysine 500 MG TABS Take by mouth. 03/11/2016: Received from: [facility]  . levocetirizine (XYZAL) 5 MG tablet Take 1 tablet (5 mg total) by mouth every evening.   . magnesium oxide (MAG-OX) 400 MG tablet Take 500 mg by mouth daily.  03/11/2016: Received from: [facility]  . montelukast (SINGULAIR) 10 MG tablet Take 1 tablet (10 mg total) by mouth at bedtime.   . Nutritional Supplements (JOINT FORMULA PO) Take 2 tablets by mouth daily. glucosamine   .  olmesartan-hydrochlorothiazide (BENICAR HCT) 20-12.5 MG tablet Take 1 tablet by mouth daily.   Marland Kitchen omeprazole (PRILOSEC) 40 MG capsule Take 1 capsule (40 mg total) by mouth daily.   . Potassium Gluconate 2.5 MEQ TABS Take 2 tablets by mouth.    Marland Kitchen rOPINIRole (REQUIP) 0.5 MG tablet Take 1 tablet (0.5 mg total) by mouth at bedtime.   . Rosuvastatin Calcium 5 MG CPSP Take 1 tablet by mouth every evening.    Facility-Administered Encounter Medications as of 06/28/2020  Medication  . cyanocobalamin ((VITAMIN B-12)) injection 1,000 mcg     Financial Resource Strain: Low Risk   . Difficulty of Paying Living Expenses: Not hard at all     Current Diagnosis/Assessment:  Goals Addressed            This Visit's Progress   . Chronic Care Management       CARE PLAN ENTRY (see longitudinal plan of care for additional care plan information)  Current Barriers:  . Chronic Disease Management support, education, and care coordination needs related to Hypertension, Hyperlipidemia, and Osteoporosis   Hypertension BP Readings from Last 3 Encounters:  11/25/19 122/72  10/18/19 130/70  04/15/19 134/78   . Pharmacist Clinical Goal(s): o Over the next 90 days, patient will work with PharmD and providers to maintain BP goal <130/80 . Current regimen:  o Benicar HCT 20/12.5mg  daily o Amlodipine 2.5 mg daily . Interventions: o None . Patient self care activities - Over the next 90  days, patient will: o Check BP weekly, document, and provide at future appointments o Ensure daily salt intake < 2300 mg/day  Hyperlipidemia Lab Results  Component Value Date/Time   LDLCALC 85 10/18/2019 10:27 AM   . Pharmacist Clinical Goal(s): o Over the next 90 days, patient will work with PharmD and providers to maintain LDL goal < 100 . Current regimen:  o Crestor $RemoveB'5mg'mkFODOQq$  daily . Interventions: o None . Patient self care activities - Over the next 90 days, patient will: o Continue to take rosuvastatin $RemoveBeforeDEI'5mg'kHmWPlTOJmmrHRqj$  every  day  Osteopenia . Pharmacist Clinical Goal(s) o Over the next 90 days, patient will work with PharmD and providers to maintain bone health. . Current regimen:  o Calcium 600/200 mg/iu twice daily o Vitamin D3 1000iu daily . Interventions: o None . Patient self care activities - Over the next 90 days, patient will: o Continue to eat dairy when possible and take calcium and vitamin D supplements  Medication management . Pharmacist Clinical Goal(s): o Over the next 90 days, patient will work with PharmD and providers to achieve optimal medication adherence . Current pharmacy: [facility] . Interventions o Comprehensive medication review performed. o Utilize [facility] pharmacy for medication synchronization, packaging and delivery . Patient self care activities - Over the next 90 days, patient will: o Work with PharmD on transition to [facility] pharmacy o Take medications as prescribed o Report any questions or concerns to PharmD and/or provider(s)  Initial goal documentation       Hypertension   BP goal is:  <140/90  Office blood pressures are  BP Readings from Last 3 Encounters:  05/09/20 (!) 126/59  04/24/20 122/84  04/18/20 123/67   Patient checks BP at home infrequently Patient home BP readings are ranging: NA  Patient has failed these meds in the past: NA Patient is currently controlled on the following medications:  . Benicar HCT . Amlodipine  We discussed: At goal Denies hypotension  Plan  Continue current medications   Hyperlipidemia   LDL goal < 70  Lipid Panel     Component Value Date/Time   CHOL 155 10/18/2019 1027   CHOL 165 06/06/2015 0834   TRIG 177 (H) 10/18/2019 1027   HDL 43 (L) 10/18/2019 1027   HDL 48 06/06/2015 0834   LDLCALC 85 10/18/2019 1027    Hepatic Function Latest Ref Rng & Units 10/18/2019 11/12/2018 11/05/2017  Total Protein 6.1 - 8.1 g/dL 7.1 7.0 8.0  Albumin 3.6 - 5.1 g/dL - - -  AST 10 - 35 U/L $Remo'22 23 21  'HQKHJ$ ALT 6 - 29 U/L $Remo'21  17 20  'lpxjl$ Alk Phosphatase 33 - 130 U/L - - -  Total Bilirubin 0.2 - 1.2 mg/dL 0.4 0.5 0.4     The 10-year ASCVD risk score Mikey Bussing DC Jr., et al., 2013) is: 10.2%   Values used to calculate the score:     Age: 54 years     Sex: Female     Is Non-Hispanic African American: Yes     Diabetic: No     Tobacco smoker: No     Systolic Blood Pressure: 789 mmHg     Is BP treated: Yes     HDL Cholesterol: 43 mg/dL     Total Cholesterol: 155 mg/dL   Patient has failed these meds in past: NA Patient is currently uncontrolled on the following medications:  . Crestor $RemoveB'5mg'OGKFTsAr$  qhs  We discussed:    Increasing exercise now  Plan  Continue current medications  Medication Management   Pt uses [facility] pharmacy for all medications Uses pill box? No - Packaging Pt endorses 100% compliance  We discussed:  Lyrica dose? Requip renewed? Calcium sources Using [facility]? Yes Stopped calcium. Stopped zinc Doesn't want vitamins packed?  KCl, Mg, garlic, Vit D3, W10, lysine, glucosamine/chondroiton, cod liver oil  Plan  Utilize [facility] pharmacy for medication synchronization, packaging and delivery  Follow up: 3 month phone visit  Milus Height, PharmD, [place], [facility] [phone]

## 2020-07-04 NOTE — Patient Instructions (Addendum)
Visit Information  Goals Addressed            This Visit's Progress   . Chronic Care Management       CARE PLAN ENTRY (see longitudinal plan of care for additional care plan information)  Current Barriers:  . Chronic Disease Management support, education, and care coordination needs related to Hypertension, Hyperlipidemia, and Osteoporosis   Hypertension BP Readings from Last 3 Encounters:  11/25/19 122/72  10/18/19 130/70  04/15/19 134/78   . Pharmacist Clinical Goal(s): o Over the next 90 days, patient will work with PharmD and providers to maintain BP goal <130/80 . Current regimen:  o Benicar HCT 20/12.5mg  daily o Amlodipine 2.5 mg daily . Interventions: o None . Patient self care activities - Over the next 90 days, patient will: o Check BP weekly, document, and provide at future appointments o Ensure daily salt intake < 2300 mg/day  Hyperlipidemia Lab Results  Component Value Date/Time   LDLCALC 85 10/18/2019 10:27 AM   . Pharmacist Clinical Goal(s): o Over the next 90 days, patient will work with PharmD and providers to maintain LDL goal < 100 . Current regimen:  o Crestor 5mg  daily . Interventions: o None . Patient self care activities - Over the next 90 days, patient will: o Continue to take rosuvastatin 5mg  every day  Osteopenia . Pharmacist Clinical Goal(s) o Over the next 90 days, patient will work with PharmD and providers to maintain bone health. . Current regimen:  o Calcium 600/200 mg/iu twice daily o Vitamin D3 1000iu daily . Interventions: o None . Patient self care activities - Over the next 90 days, patient will: o Continue to eat dairy when possible and take calcium and vitamin D supplements  Medication management . Pharmacist Clinical Goal(s): o Over the next 90 days, patient will work with PharmD and providers to achieve optimal medication adherence . Current pharmacy: [facility] . Interventions o Comprehensive medication review  performed. o Utilize [facility] pharmacy for medication synchronization, packaging and delivery . Patient self care activities - Over the next 90 days, patient will: o Work with PharmD on transition to [facility] pharmacy o Take medications as prescribed o Report any questions or concerns to PharmD and/or provider(s)  Initial goal documentation        Print copy of patient instructions provided.   Telephone follow up appointment with pharmacy team member scheduled for: 3 months  Felton Clinton, PharmD, [place], CTTS Clinical Pharmacist [facility] [phone]  Chronic Bronchitis, Adult Chronic bronchitis is long-lasting inflammation of the tubes that carry air into your lungs (bronchial tubes). This is inflammation that occurs:  On most days of the week.  For at least three months at a time.  Over a period of two years in a row. When the bronchial tubes are inflamed, they start to produce mucus. The inflammation and buildup of mucus make it more difficult to breathe. Chronic bronchitis is usually a permanent problem. It is one type of chronic obstructive pulmonary disease (COPD). People with chronic bronchitis are more likely to get frequent colds or respiratory infections. What are the causes? Chronic bronchitis most often occurs in people who:  Have chronic, severe asthma.  Have a history of smoking.  Have asthma and smoke.  Have certain lung diseases.  Have had long-term exposure to certain irritating fumes or chemicals. What are the signs or symptoms? Symptoms of chronic bronchitis may include:  A cough that brings up mucus (productive cough).  Shortness of breath.  Loud  breathing (wheezing).  Chest discomfort.  Frequent (recurring) colds or respiratory infections. Certain things can trigger chronic bronchitis symptoms or make them worse, such as:  Infections.  Stopping certain medicines.  Smoking.  Exposure to chemicals. How is this  diagnosed? This condition may be diagnosed based on:  Your symptoms and medical history.  A physical exam.  A chest X-ray.  Lung (pulmonary) function tests. How is this treated? There is no cure for chronic bronchitis, but treatment can help control your symptoms. Treatment may include:  Using a cool mist vaporizer or humidifier to make it easier to breathe.  Drinking more fluids. Drinking more makes your mucus thinner, which may make it easier to breathe.  Lifestyle changes, such as eating a healthier diet and getting more exercise.  Medicines, such as: ? Inhalers to improve air flow in and out of your lungs. ? Antibiotics to treat any bacterial infections you have, such as:  Lung infection (pneumonia).  Sinus infection.  A sudden, severe (acute) episode of bronchitis.  Oxygen therapy.  Preventing infections by keeping up to date on vaccinations, including the pneumonia and flu vaccines.  Pulmonary rehabilitation. This is a program that helps you manage your breathing problems and improve your quality of life. It may last for up to 4-12 weeks and may include exercise programs, education, counseling, and treatment support. Follow these instructions at home: Medicines  Take over-the-counter and prescription medicines only as told by your health care provider.  If you were prescribed an antibiotic medicine, take it as told by your health care provider. Do not stop taking the antibiotic even if you start to feel better. Preventing infections  Get vaccinations as told by your health care provider. Make sure you get a flu shot (influenza vaccine) every year.  Wash your hands often with soap and water. If soap and water are not available, use hand sanitizer.  Avoid contact with people who have symptoms of a cold or the flu. Managing symptoms   Do not smoke, and avoid secondhand smoke. Exposure to cigarette smoke or irritating chemicals will make bronchitis worse. If you  smoke and you need help quitting, ask your health care provider. Quitting smoking will help your lungs heal faster.  Use an inhaler, cool mist vaporizer, or humidifier as told by your health care provider.  Avoid pollen, dust, animal dander, molds, smoke, and other things that cause shortness of breath or wheezing attacks.  Use oxygen therapy at home as directed. Follow instructions from your health care provider about how to use oxygen safely and take precautions to prevent fire. Make sure you never smoke while using oxygen or allow others to smoke in your home.  Do not wait to get medical care if you have any concerning symptoms or trouble breathing. Waiting could cause permanent injury and may be life threatening. General instructions  Talk with your health care provider about what activities are safe for you and about possible exercise routines. Regular exercise is very important to help you feel better.  Drink enough fluids to keep your urine pale yellow.  Keep all follow-up visits as told by your health care provider. This is important. Contact a health care provider if:  You have coughing or shortness of breath that gets worse.  You have muscle aches.  You have chest pain.  Your mucus seems to get thicker.  Your mucus changes from clear or [place] to yellow, green, gray, or bloody. Get help right away if:  Your usual medicines  do not stop your wheezing.  You have severe difficulty breathing. These symptoms may represent a serious problem that is an emergency. Do not wait to see if the symptoms will go away. Get medical help right away. Call your local emergency services (911 in the [place] Do not drive yourself to the hospital. Summary  Chronic bronchitis is long-lasting inflammation of the tubes that carry air into your lungs (bronchial tubes).  Chronic bronchitis is usually a permanent problem. It is one type of chronic obstructive pulmonary disease (COPD).  There is no  cure for chronic bronchitis, but treatment can help control your symptoms.  Do not smoke, and avoid secondhand smoke. Exposure to cigarette smoke or irritating chemicals will make bronchitis worse. This information is not intended to replace advice given to you by your health care provider. Make sure you discuss any questions you have with your health care provider. Document Revised: 07/02/2018 Document Reviewed: 07/30/2017 Elsevier Patient Education  2020 [employer].

## 2020-07-10 ENCOUNTER — Other Ambulatory Visit: Payer: Self-pay

## 2020-07-10 ENCOUNTER — Ambulatory Visit
Admission: RE | Admit: 2020-07-10 | Discharge: 2020-07-10 | Disposition: A | Discharge status: Home / Self Care | Payer: Medicare HMO | Source: Ambulatory Visit | Attending: Family Medicine | Admitting: Family Medicine

## 2020-07-10 DIAGNOSIS — Z1231 Encounter for screening mammogram for malignant neoplasm of breast: Secondary | ICD-10-CM | POA: Insufficient documentation

## 2020-07-14 DIAGNOSIS — G4733 Obstructive sleep apnea (adult) (pediatric): Secondary | ICD-10-CM | POA: Diagnosis not present

## 2020-07-24 ENCOUNTER — Ambulatory Visit: Payer: Medicare HMO

## 2020-07-25 ENCOUNTER — Other Ambulatory Visit: Payer: Self-pay

## 2020-07-25 ENCOUNTER — Ambulatory Visit (INDEPENDENT_AMBULATORY_CARE_PROVIDER_SITE_OTHER): Payer: Medicare HMO

## 2020-07-25 DIAGNOSIS — M5033 Other cervical disc degeneration, cervicothoracic region: Secondary | ICD-10-CM | POA: Diagnosis not present

## 2020-07-25 DIAGNOSIS — E538 Deficiency of other specified B group vitamins: Secondary | ICD-10-CM

## 2020-07-25 DIAGNOSIS — M9903 Segmental and somatic dysfunction of lumbar region: Secondary | ICD-10-CM | POA: Diagnosis not present

## 2020-07-25 DIAGNOSIS — M9901 Segmental and somatic dysfunction of cervical region: Secondary | ICD-10-CM | POA: Diagnosis not present

## 2020-07-25 DIAGNOSIS — R519 Headache, unspecified: Secondary | ICD-10-CM | POA: Diagnosis not present

## 2020-07-26 DIAGNOSIS — H2000 Unspecified acute and subacute iridocyclitis: Secondary | ICD-10-CM | POA: Diagnosis not present

## 2020-08-14 DIAGNOSIS — G4733 Obstructive sleep apnea (adult) (pediatric): Secondary | ICD-10-CM | POA: Diagnosis not present

## 2020-08-15 ENCOUNTER — Encounter: Payer: Self-pay | Admitting: Family Medicine

## 2020-08-15 ENCOUNTER — Other Ambulatory Visit: Payer: Self-pay | Admitting: Family Medicine

## 2020-08-15 ENCOUNTER — Telehealth: Payer: Self-pay

## 2020-08-15 MED ORDER — PRAVASTATIN SODIUM 20 MG PO TABS: 20.0000 mg | ORAL_TABLET | Freq: Every day | ORAL | 0 refills | Status: DC

## 2020-08-15 NOTE — Progress Notes (Addendum)
Chronic Care Management Pharmacy Assistant   Name: Ruth [name]  MRN: [id number] DOB: [birth year]  Reason for Encounter: Medication Review  Patient Questions:  1.  Have you seen any other providers since your last visit? No  2.  Any changes in your medicines or health? Yes, Stopped taking Rosuvastatin (believed told by Pharm D at last visit that it may be the cause of her nighttime cramping).  Started taking CoQ10.     [name],  74 y.o. , female presents for their Follow-Up CCM visit with the clinical pharmacist via telephone.  PCP : Ruth Cory, MD  Allergies:   Allergies  Allergen Reactions   Ace Inhibitors Cough   Aspirin Nausea Only    Abdominal pain    Medications: Outpatient Encounter Medications as of 08/15/2020  Medication Sig Note   acetaminophen (TYLENOL) 500 MG tablet Take 1-2 tablets (500-1,000 mg total) by mouth every 8 (eight) hours as needed.    albuterol (VENTOLIN HFA) 108 (90 Base) MCG/ACT inhaler Inhale 2 puffs into the lungs every 6 (six) hours as needed for wheezing or shortness of breath.    amLODipine (NORVASC) 2.5 MG tablet Take 1 tablet (2.5 mg total) by mouth daily.    Ascorbic Acid (VITAMIN C) 1000 MG tablet Take by mouth. 03/11/2016: Received from: [facility]   BREO ELLIPTA 200-25 MCG/INH AEPB Take 1 puff by mouth daily.    Calcium-Vitamin D 600-200 MG-UNIT tablet Take 1 tablet by mouth 2 (two) times daily.  03/11/2016: Received from: [facility]   Cholecalciferol (VITAMIN D3) 1000 units CAPS Take by mouth. 03/11/2016: Received from: [facility] Liver Oil 1000 MG CAPS Take by mouth.    Cyanocobalamin (B-12) 2000 MCG TABS Take 1 tablet by mouth daily.    Garlic 100 MG TABS Take 1,000 mg by mouth.     L-Lysine 500 MG TABS Take by mouth. 03/11/2016: Received from: [facility]   levocetirizine (XYZAL) 5 MG tablet Take 1 tablet (5 mg total) by mouth every evening.      magnesium oxide (MAG-OX) 400 MG tablet Take 500 mg by mouth daily.  03/11/2016: Received from: [facility]   montelukast (SINGULAIR) 10 MG tablet Take 1 tablet (10 mg total) by mouth at bedtime.    Nutritional Supplements (JOINT FORMULA PO) Take 2 tablets by mouth daily. glucosamine    olmesartan-hydrochlorothiazide (BENICAR HCT) 20-12.5 MG tablet Take 1 tablet by mouth daily.    omeprazole (PRILOSEC) 40 MG capsule Take 1 capsule (40 mg total) by mouth daily.    Potassium Gluconate 2.5 MEQ TABS Take 2 tablets by mouth.     rOPINIRole (REQUIP) 0.5 MG tablet Take 1 tablet (0.5 mg total) by mouth at bedtime.    Rosuvastatin Calcium 5 MG CPSP Take 1 tablet by mouth every evening.    Facility-Administered Encounter Medications as of 08/15/2020  Medication   cyanocobalamin ((VITAMIN B-12)) injection 1,000 mcg    Current Diagnosis: Patient Active Problem List   Diagnosis Date Noted   OSA on CPAP 04/15/2019   B12 deficiency 12/09/2018   Dyslipidemia 11/08/2017   Vitamin D deficiency 10/31/2016   Osteopenia of left hip 10/31/2016   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   BPV (benign positional vertigo) 04/17/2016   RLS (restless legs syndrome) 04/17/2016   GERD (gastroesophageal reflux disease) 03/11/2016   History of hyperparathyroidism 03/11/2016   History of gastric ulcer 03/11/2016   Essential hypertension  04/24/2015   Primary osteoarthritis involving multiple joints 04/24/2015   Lumbago 04/24/2015   Family history of colonic polyps     Goals Addressed   None     Follow-Up:  Occupational hygienist  .Marland Kitchen Reviewed chart for medication changes ahead of medication coordination call.  No OVs, Consults, or hospital visits since last care coordination call/Pharmacist visit. (If appropriate, list visit date, provider name)  No medication changes indicated OR if recent visit, treatment plan here.  BP Readings from Last 3 Encounters:  05/09/20  (!) 126/59  04/24/20 122/84  04/18/20 123/67    Lab Results  Component Value Date   HGBA1C 5.3 10/31/2016     Patient obtains medications through Adherence Packaging  30 Days   Last delivery was for enrollment into the compliance packaging. This will be the patients first compliance delivery.   Patient is due for next adherence delivery on: 08/20/2020. Called patient and reviewed medications and coordinated delivery.  This delivery to include:  Amlodipine (NORVASC) 2.5 MG tablet- Take 1 tablet by mouth every day at bedtime Ascorbic Acid (VITAMIN C) 1000 MG tablet- Take by mouth as directed (OTC) BREO ELLIPTA 200-25 MCG/INH AEPB- Take one puff by mouth daily  Calcium-Vitamin D 600-200 MG-UNIT tablet- Take one tablet by mouth every day at bedtime Cod Liver Oil 1000 MG CAPS- Take one tablet by mouth every day at bedtime Cholecalciferol (VITAMIN D3) 1000 units CAPS- Take one tablet by mouth every day at bedtime Montelukast 10 mg- Take one tablet by mouth every day at bedtime Magnesium Oxide (MAG-OX) 400 MG tablet- Take one tablet by mouth every day at bedtime Levocetirizine (Xyzal) 5 mg tablet- Take one tablet by mouth every day  Olmesartan-hydrochlorothiazide (BENICAR HCT) 20-12.5 MG tablet- Take one tablet by mouth every day at bedtime Omeprazole (PRILOSEC) 40 MG capsule- take one tablet by mouth every day at bedtime (Needs Rx) Ropinirole (REQUIP) 0.5 MG tablet- Take one tablet by mouth every day at bedtime Pravastatin 20 mg tablet- take one tablet by mouth daily at bedtime    Patient will not need a short supply or acute fill for any medication prior to delivery date  Coordinated acute fill for (med) to be delivered (date).  Patient declined the following medications (meds) due to (reason): Co Q 10- Take one a day for cramps (Abundance on hand due to recently purchased bulk supply) Rosuvastatin Calcium 5 MG CPSP- Take one tablet by mouth every day at bedtime (Believed stopped by  Felicity Pellegrini due to cramps) Acetaminophen (TYLENOL) 500 MG tablet- Take 1-2 tablets (500-1,000 mg total) by mouth every 8 (eight) hours as needed (OTC)(PRN) albuterol (VENTOLIN HFA) 108 (90 Base) MCG/ACT inhaler- Inhale 2 puffs every 6 hours as needed for shortness of breath or wheezing (PRN) Cyanocobalamin (B-12) 2000 MCG TABS- Take one tablet by mouth every day at bedtime (Abundance on hand due to recently purchased bulk supply) Potassium Gluconate 2.5 MEQ TABS- Take two tablets by mouth every day at bedtime (abundance due to taking only one tablet daily) Garlic 100 MG TABS- Take one tablet by mouth every day at bedtime (Abundance on hand due to recently purchased bulk supply) (Also told by Pharm D that it probably wasn't needed) L-Lysine 500 MG TABS-Take one tablet by mouth every day at bedtime (Abundance on hand due to recently purchased bulk supply)   Patient needs refills for:  Levocetirizine (Xyzal) 5 mg tablet- Take one tablet by mouth every day (OTC) (NEEDS A PRESCRIPTION FOR CHEAPER PRICE  THROUGH INSURANCE) Ascorbic Acid (VITAMIN C) 1000 MG tablet- Take by mouth as directed (OTC) Omeprazole (PRILOSEC) 40 MG capsule- take one tablet by mouth every day at bedtime .  Confirmed delivery date of 08/20/2020, advised patient that pharmacy will contact them the morning of delivery.  Encouraged pt to take Rosuvastatin per Felton Clinton last note, pt was just to start the CoQ10. She is adament that this was discontinued and reported that last night was the first time in a very long time she did not have to get up during the night to walk out cramps.   Pt checks her blood pressure every week when she attends church. The church does a vitals check prior to service for all patrons due to Covid 19. Pt's recent blood pressures run 118/86 and 120/86.   Pt is concerned with recent change of Ted to [place] for Pharm D needs. Reassurance provided in abundance that we work as a team in order to prevent any lapse of  client needs. Pt was thankful.  Addendum 08/15/20: Spoke with patient regarding myalgias. Cramps are likely more related to her ongoing neuralgia/ restless legs, but she does feel it has improved since stopping rosuvastatin and starting CoQ10. Spoke with provider about rosuvastatin discontinuation. Provider in agreement about stopping rosuvastatin and starting pravastatin 20 mg daily.   Garey Ham Clinical Pharmacist [facility] [phone]

## 2020-08-15 NOTE — Progress Notes (Addendum)
Pt made aware of medication change, Will discontinue Rosuvastatin and start Pravastatin 20 mg daily.  Medication will be delivered with upcoming adherence packaging.  Will follow up with Trinna Post or Dr Carlynn Purl if leg cramps begin again. Pt voices understanding and agreement.

## 2020-08-16 ENCOUNTER — Other Ambulatory Visit: Payer: Self-pay

## 2020-08-16 MED ORDER — LEVOCETIRIZINE DIHYDROCHLORIDE 5 MG PO TABS
5.0000 mg | ORAL_TABLET | Freq: Every evening | ORAL | 0 refills | Status: DC
Start: 2020-08-16 — End: 2022-09-17

## 2020-08-24 ENCOUNTER — Ambulatory Visit: Payer: Medicare HMO

## 2020-08-31 ENCOUNTER — Other Ambulatory Visit: Payer: Self-pay

## 2020-08-31 ENCOUNTER — Ambulatory Visit (INDEPENDENT_AMBULATORY_CARE_PROVIDER_SITE_OTHER): Payer: Medicare HMO

## 2020-08-31 DIAGNOSIS — E538 Deficiency of other specified B group vitamins: Secondary | ICD-10-CM

## 2020-09-13 ENCOUNTER — Telehealth: Payer: Self-pay

## 2020-09-13 DIAGNOSIS — G4733 Obstructive sleep apnea (adult) (pediatric): Secondary | ICD-10-CM | POA: Diagnosis not present

## 2020-09-13 NOTE — Progress Notes (Signed)
Chronic Care Management Pharmacy Assistant   Name: Ruth [name]  MRN: [id number] DOB: 07-27-1946  Reason for Encounter: Medication Review  Patient Questions:  1.  Have you seen any other providers since your last visit? No  2.  Any changes in your medicines or health? No   .  PCP : Alba Cory, MD  Allergies:   Allergies  Allergen Reactions  . Ace Inhibitors Cough  . Aspirin Nausea Only    Abdominal pain  . Crestor [Rosuvastatin]     Muscle pain     Medications: Outpatient Encounter Medications as of 09/13/2020  Medication Sig Note  . acetaminophen (TYLENOL) 500 MG tablet Take 1-2 tablets (500-1,000 mg total) by mouth every 8 (eight) hours as needed.   Marland Kitchen albuterol (VENTOLIN HFA) 108 (90 Base) MCG/ACT inhaler Inhale 2 puffs into the lungs every 6 (six) hours as needed for wheezing or shortness of breath.   Marland Kitchen amLODipine (NORVASC) 2.5 MG tablet Take 1 tablet (2.5 mg total) by mouth daily.   . Ascorbic Acid (VITAMIN C) 1000 MG tablet Take by mouth. 03/11/2016: Received from: [facility]  . BREO ELLIPTA 200-25 MCG/INH AEPB Take 1 puff by mouth daily.   . Calcium-Vitamin D 600-200 MG-UNIT tablet Take 1 tablet by mouth 2 (two) times daily.  03/11/2016: Received from: [facility]  . Cholecalciferol (VITAMIN D3) 1000 units CAPS Take by mouth. 03/11/2016: Received from: [facility]  . Cod Liver Oil 1000 MG CAPS Take by mouth.   . Cyanocobalamin (B-12) 2000 MCG TABS Take 1 tablet by mouth daily.   . Garlic 100 MG TABS Take 1,000 mg by mouth.    . L-Lysine 500 MG TABS Take by mouth. 03/11/2016: Received from: [facility]  . levocetirizine (XYZAL) 5 MG tablet Take 1 tablet (5 mg total) by mouth every evening.   . magnesium oxide (MAG-OX) 400 MG tablet Take 500 mg by mouth daily.  03/11/2016: Received from: [facility]  . montelukast (SINGULAIR) 10 MG tablet Take 1 tablet (10 mg total) by mouth  at bedtime.   . Nutritional Supplements (JOINT FORMULA PO) Take 2 tablets by mouth daily. glucosamine   . olmesartan-hydrochlorothiazide (BENICAR HCT) 20-12.5 MG tablet Take 1 tablet by mouth daily.   Marland Kitchen omeprazole (PRILOSEC) 40 MG capsule Take 1 capsule (40 mg total) by mouth daily.   . Potassium Gluconate 2.5 MEQ TABS Take 2 tablets by mouth.    . pravastatin (PRAVACHOL) 20 MG tablet Take 1 tablet (20 mg total) by mouth daily.   Marland Kitchen rOPINIRole (REQUIP) 0.5 MG tablet Take 1 tablet (0.5 mg total) by mouth at bedtime.    Facility-Administered Encounter Medications as of 09/13/2020  Medication  . cyanocobalamin ((VITAMIN B-12)) injection 1,000 mcg    Current Diagnosis: Patient Active Problem List   Diagnosis Date Noted  . OSA on CPAP 04/15/2019  . B12 deficiency 12/09/2018  . Dyslipidemia 11/08/2017  . Vitamin D deficiency 10/31/2016  . Osteopenia of left hip 10/31/2016  . 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  . BPV (benign positional vertigo) 04/17/2016  . RLS (restless legs syndrome) 04/17/2016  . GERD (gastroesophageal reflux disease) 03/11/2016  . History of hyperparathyroidism 03/11/2016  . History of gastric ulcer 03/11/2016  . Essential hypertension 04/24/2015  . Primary osteoarthritis involving multiple joints 04/24/2015  . Lumbago 04/24/2015  . Family history of colonic polyps     Goals Addressed   None     Follow-Up:  Occupational hygienist and Pharmacist Review  .Marland Kitchen Reviewed chart for medication changes ahead of medication coordination call.  No OVs, Consults, or hospital visits since last care coordination call/Pharmacist visit. (If appropriate, list visit date, provider name)  No medication changes indicated OR if recent visit, treatment plan here.  BP Readings from Last 3 Encounters:  05/09/20 (!) 126/59  04/24/20 122/84  04/18/20 123/67    Lab Results  Component Value Date   HGBA1C 5.3 10/31/2016    Patient decided to un-enroll  from adherence packaging and return to [facility] pharmacy.  She prefers to be able to get prescriptions at her convenience including on Sat and Sun

## 2020-09-18 ENCOUNTER — Other Ambulatory Visit: Payer: Self-pay | Admitting: Family Medicine

## 2020-09-18 NOTE — Telephone Encounter (Signed)
Medication Refill - Medication: pravastatin (PRAVACHOL) 20 MG tablet   Has the patient contacted their pharmacy? Yes.   (Agent: If no, request that the patient contact the pharmacy for the refill.) (Agent: If yes, when and what did the pharmacy advise?)  Preferred Pharmacy (with phone number or street name):  [facility] [address] [place] - [address]  [address] [place] [address]  Phone: [phone] Fax: [phone]     Agent: Please be advised that RX refills may take up to 3 business days. We ask that you follow-up with your pharmacy.

## 2020-09-25 ENCOUNTER — Other Ambulatory Visit: Payer: Self-pay | Admitting: Family Medicine

## 2020-09-25 NOTE — Telephone Encounter (Signed)
Copied from CRM [id number]. Topic: Quick Communication - Rx Refill/Question >> Sep 25, 2020  9:34 AM Jaquita Rector A wrote: Medication: pravastatin (PRAVACHOL) 20 MG tablet Have not had any since 09/21/20 Has the patient contacted their pharmacy? Yes.   (Agent: If no, request that the patient contact the pharmacy for the refill.) (Agent: If yes, when and what did the pharmacy advise?)  Preferred Pharmacy (with phone number or street name): [id number] - [place], [place] - [address]  Phone:  [phone] Fax:  [phone]     Agent: Please be advised that RX refills may take up to 3 business days. We ask that you follow-up with your pharmacy.

## 2020-09-28 ENCOUNTER — Other Ambulatory Visit: Payer: Self-pay

## 2020-09-28 ENCOUNTER — Ambulatory Visit (INDEPENDENT_AMBULATORY_CARE_PROVIDER_SITE_OTHER): Payer: Medicare HMO

## 2020-09-28 DIAGNOSIS — E538 Deficiency of other specified B group vitamins: Secondary | ICD-10-CM | POA: Diagnosis not present

## 2020-09-28 MED ORDER — PRAVASTATIN SODIUM 20 MG PO TABS
20.0000 mg | ORAL_TABLET | Freq: Every day | ORAL | 0 refills | Status: DC
Start: 2020-09-28 — End: 2020-12-11

## 2020-10-11 ENCOUNTER — Telehealth: Payer: Self-pay

## 2020-10-11 NOTE — Progress Notes (Signed)
Spoke to patient to confirmed patient telephone appointment on 10/12/2020 for CCM at 8:15 am with Angelena Sole the Clinical pharmacist.    Patient verbalized understanding. Everlean Cherry Clinical Pharmacist Assistant [phone]

## 2020-10-12 ENCOUNTER — Ambulatory Visit: Payer: Medicare HMO

## 2020-10-12 DIAGNOSIS — E785 Hyperlipidemia, unspecified: Secondary | ICD-10-CM

## 2020-10-12 DIAGNOSIS — I10 Essential (primary) hypertension: Secondary | ICD-10-CM

## 2020-10-12 NOTE — Patient Instructions (Signed)
Visit Information It was great speaking with you today!  Please let me know if you have any questions about our visit. Goals Addressed            This Visit's Progress   . Chronic Care Management       CARE PLAN ENTRY (see longitudinal plan of care for additional care plan information)  Current Barriers:  . Chronic Disease Management support, education, and care coordination needs related to Hypertension, Hyperlipidemia, GERD, Osteopenia, Osteoarthritis, and Chronic Bronchitis, and Restless Legs Syndrome    Hypertension BP Readings from Last 3 Encounters:  11/25/19 122/72  10/18/19 130/70  04/15/19 134/78   . Pharmacist Clinical Goal(s): o Over the next 90 days, patient will work with PharmD and providers to maintain BP goal <130/80 . Current regimen:  o Olmesartan HCT 20/12.5mg  daily o Amlodipine 2.5 mg daily . Interventions: o None . Patient self care activities - Over the next 90 days, patient will: o Check BP weekly, document, and provide at future appointments o Ensure daily salt intake < 2300 mg/day  Hyperlipidemia Lab Results  Component Value Date/Time   LDLCALC 85 10/18/2019 10:27 AM   . Pharmacist Clinical Goal(s): o Over the next 90 days, patient will work with PharmD and providers to maintain LDL goal < 100 . Current regimen:  o Pravastatin 5mg  daily . Interventions: o None  Osteopenia . Pharmacist Clinical Goal(s) o Over the next 90 days, patient will work with PharmD and providers to maintain bone health and prevent fractures. . Current regimen:  o Calcium 600/200 mg/iu twice daily o Vitamin D3 1000iu daily . Interventions: o None . Patient self care activities - Over the next 90 days, patient will: o Continue to eat dairy when possible and take calcium and vitamin D supplements  Medication management . Pharmacist Clinical Goal(s): o Over the next 90 days, patient will work with PharmD and providers to achieve optimal medication  adherence . Current pharmacy: [facility] . Interventions o Comprehensive medication review performed. o Continue current medication management strategy . Patient self care activities - Over the next 90 days, patient will: o Work with PharmD on transition to [facility] pharmacy o Take medications as prescribed o Report any questions or concerns to PharmD and/or provider(s)       The patient verbalized understanding of instructions, educational materials, and care plan provided today and declined offer to receive copy of patient instructions, educational materials, and care plan.   Telephone follow up appointment with pharmacy team member scheduled for: 04/11/21 at 10:00 AM  04/13/21 Clinical Pharmacist [facility] [phone]

## 2020-10-12 NOTE — Chronic Care Management (AMB) (Signed)
Chronic Care Management Pharmacy  Name: Ruth [name]  MRN: [id number] DOB: 07/02/1946  Chief Complaint/ HPI  Ruth [name],  75 y.o. , female presents for their Follow-Up CCM visit with the clinical pharmacist via telephone.  PCP : Steele Sizer, MD  Their chronic conditions include: Hypertension, Hyperlipidemia, GERD, Osteopenia, Osteoarthritis, and Chronic Bronchitis, and Restless Legs Syndrome   Office Visits: 04/24/20: Patient presented to Dr. Ancil Boozer for follow-up. Zinc stopped.   Consult Visit:NA  Medications: Outpatient Encounter Medications as of 10/12/2020  Medication Sig Note  . amLODipine (NORVASC) 2.5 MG tablet Take 1 tablet (2.5 mg total) by mouth daily.   . Ascorbic Acid (VITAMIN C) 1000 MG tablet Take 1,000 mg by mouth. 03/11/2016: Received from: [place]  . Coenzyme Q10 (COQ10) 200 MG CAPS Take 200 mg by mouth daily.   Marland Kitchen DM-APAP-CPM (CORICIDIN HBP FLU PO) Take 1 capsule by mouth every 8 (eight) hours as needed (Cough).   Marland Kitchen levocetirizine (XYZAL) 5 MG tablet Take 1 tablet (5 mg total) by mouth every evening.   . olmesartan-hydrochlorothiazide (BENICAR HCT) 20-12.5 MG tablet Take 1 tablet by mouth daily.   Marland Kitchen acetaminophen (TYLENOL) 500 MG tablet Take 1-2 tablets (500-1,000 mg total) by mouth every 8 (eight) hours as needed.   Marland Kitchen albuterol (VENTOLIN HFA) 108 (90 Base) MCG/ACT inhaler Inhale 2 puffs into the lungs every 6 (six) hours as needed for wheezing or shortness of breath.   Marland Kitchen BREO ELLIPTA 200-25 MCG/INH AEPB Take 1 puff by mouth daily.   . Calcium-Vitamin D 600-200 MG-UNIT tablet Take 1 tablet by mouth 2 (two) times daily.  03/11/2016: Received from: [place]  . Cholecalciferol (VITAMIN D3) 1000 units CAPS Take by mouth. 03/11/2016: Received from: [place]  . Cod Liver Oil 1000 MG CAPS Take by mouth.   . Cyanocobalamin (B-12) 2000 MCG TABS Take 1 tablet by mouth daily.   . Garlic 9169 MG CAPS Take  1,000 mg by mouth.    . L-Lysine 500 MG TABS Take by mouth. 03/11/2016: Received from: [place]  . magnesium oxide (MAG-OX) 400 MG tablet Take 500 mg by mouth daily.  03/11/2016: Received from: [place]  . montelukast (SINGULAIR) 10 MG tablet Take 1 tablet (10 mg total) by mouth at bedtime.   . Nutritional Supplements (JOINT FORMULA PO) Take 2 tablets by mouth daily. glucosamine   . omeprazole (PRILOSEC) 40 MG capsule Take 1 capsule (40 mg total) by mouth daily.   . Potassium Gluconate 2.5 MEQ TABS Take 2 tablets by mouth.    . pravastatin (PRAVACHOL) 20 MG tablet Take 1 tablet (20 mg total) by mouth daily.   . prednisoLONE acetate (PRED FORTE) 1 % ophthalmic suspension Instill 1 drop into both eyes twice a day   . rOPINIRole (REQUIP) 0.5 MG tablet Take 1 tablet (0.5 mg total) by mouth at bedtime.   . [DISCONTINUED] gabapentin (NEURONTIN) 600 MG tablet Take by mouth.    Facility-Administered Encounter Medications as of 10/12/2020  Medication  . cyanocobalamin ((VITAMIN B-12)) injection 1,000 mcg   Financial Resource Strain: Low Risk   . Difficulty of Paying Living Expenses: Not hard at all     Current Diagnosis/Assessment:  Goals Addressed            This Visit's Progress   . Chronic Care Management       CARE PLAN ENTRY (see longitudinal plan of care for additional care plan information)  Current Barriers:  . Chronic Disease Management support, education, and care coordination needs related to Hypertension, Hyperlipidemia, GERD, Osteopenia, Osteoarthritis, and Chronic Bronchitis, and Restless Legs Syndrome    Hypertension BP Readings from Last 3 Encounters:  11/25/19 122/72  10/18/19 130/70  04/15/19 134/78   . Pharmacist Clinical Goal(s): o Over the next 90 days, patient will work with PharmD and providers to maintain BP goal <130/80 . Current regimen:  o Olmesartan HCT 20/12.91m daily o Amlodipine 2.5 mg  daily . Interventions: o None . Patient self care activities - Over the next 90 days, patient will: o Check BP weekly, document, and provide at future appointments o Ensure daily salt intake < 2300 mg/day  Hyperlipidemia Lab Results  Component Value Date/Time   LDLCALC 85 10/18/2019 10:27 AM   . Pharmacist Clinical Goal(s): o Over the next 90 days, patient will work with PharmD and providers to maintain LDL goal < 100 . Current regimen:  o Pravastatin 560mdaily . Interventions: o None  Osteopenia . Pharmacist Clinical Goal(s) o Over the next 90 days, patient will work with PharmD and providers to maintain bone health and prevent fractures. . Current regimen:  o Calcium 600/200 mg/iu twice daily o Vitamin D3 1000iu daily . Interventions: o None . Patient self care activities - Over the next 90 days, patient will: o Continue to eat dairy when possible and take calcium and vitamin D supplements  Medication management . Pharmacist Clinical Goal(s): o Over the next 90 days, patient will work with PharmD and providers to achieve optimal medication adherence . Current pharmacy: [facility] . Interventions o Comprehensive medication review performed. o Continue current medication management strategy . Patient self care activities - Over the next 90 days, patient will: o Work with PharmD on transition to [facility] pharmacy o Take medications as prescribed o Report any questions or concerns to PharmD and/or provider(s)      Hypertension   BP goal is:  <140/90  Office blood pressures are  BP Readings from Last 3 Encounters:  05/09/20 (!) 126/59  04/24/20 122/84  04/18/20 123/67   Patient checks BP at home 1-2x per week Patient home BP readings are ranging: 127/86  Patient has failed these meds in the past: NA Patient is currently controlled on the following medications:  . Amlodipine 2.5 mg daily  . Olmesartan-HCTZ 20-12.5 mg daily   We discussed: At goal Denies  hypotension  Plan  Continue current medications   Hyperlipidemia   LDL goal < 70  Lipid Panel     Component Value Date/Time   CHOL 155 10/18/2019 1027   CHOL 165 06/06/2015 0834   TRIG 177 (H) 10/18/2019 1027   HDL 43 (L) 10/18/2019 1027   HDL 48 06/06/2015 0834   LDLCALC 85 10/18/2019 1027    Hepatic Function Latest Ref Rng & Units 10/18/2019 11/12/2018 11/05/2017  Total Protein 6.1 - 8.1 g/dL 7.1 7.0 8.0  Albumin 3.6 - 5.1 g/dL - - -  AST 10 - 35 U/L _0 ALT 6 - 29 U/L _1 Alk Phosphatase 33 - 130 U/L - - -  Total Bilirubin 0.2 - 1.2 mg/dL 0.4 0.5 0.4     The 10-year ASCVD risk score (GMikey BussingC Jr., et al., 2013) is: 10.2%   Values used to calculate the score:     Age: [age]ears     Sex: Female     Is Non-Hispanic African American: Yes     Diabetic: No  Tobacco smoker: No     Systolic Blood Pressure: 126 mmHg     Is BP treated: Yes     HDL Cholesterol: 43 mg/dL     Total Cholesterol: 155 mg/dL   Patient has failed these meds in past: Rosuvastatin (leg cramps - possibly related to dehydration) Patient is currently controlled on the following medications:  . Pravastatin 20 mg daily   We discussed: Patient has been tolerating pravastatin well. She previous had leg cramps while on rosuvastatin which has resolved. She thinks it may have been due to being dehydrated and not the medication.   Plan  Continue current medications  Chronic Bronchitis    Eosinophil count:   Lab Results  Component Value Date/Time   EOSPCT 1.0 10/18/2019 10:27 AM  %                               Eos (Absolute):  Lab Results  Component Value Date/Time   EOSABS 77 10/18/2019 10:27 AM    Tobacco Status:  Social History   Tobacco Use  Smoking Status Never Smoker  Smokeless Tobacco Never Used    Patient has failed these meds in past: NA Patient is currently controlled on the following medications:  . Albuterol HFA 108 mcg/act 2 puff q6hr PRN  . Breo Ellipta 200-25  mcg/act 1 puff daily  . Montelukast 10 mg QHS  .  Using maintenance inhaler regularly? Yes Frequency of rescue inhaler use:  several times per month  We discussed:  Denies affordability concerns with inhaler   Plan  Continue current medications  Osteopenia    Last DEXA Scan: 06/01/19   T-Score femoral neck: -1.9  T-Score total hip: -1.1  T-Score lumbar spine: -1.9  T-Score forearm radius: NA  10-year probability of major osteoporotic fracture: 5.3%  10-year probability of hip fracture: 1.1%  Vit D, 25-Hydroxy  Date Value Ref Range Status  10/18/2019 38 30 - 100 ng/mL Final    Comment:    Vitamin D Status         25-OH Vitamin D: . Deficiency:                    <20 ng/mL Insufficiency:             20 - 29 ng/mL Optimal:                 > or = 30 ng/mL . For 25-OH Vitamin D testing on patients on  D2-supplementation and patients for whom quantitation  of D2 and D3 fractions is required, the QuestAssureD(TM) 25-OH VIT D, (D2,D3), LC/MS/MS is recommended: order  code 93112 (patients >8yrs). See Note 1 . Note 1 . For additional information, please refer to  http://education.QuestDiagnostics.com/faq/FAQ199  (This link is being provided for informational/ educational purposes only.)     Patient is not a candidate for pharmacologic treatment  Patient has failed these meds in past: NA Patient is currently controlled on the following medications:  . Calcium + Vit D 600-200 1 tablet twice daily  . Vitamin D3 1000 units daily   We discussed:  Recommend [id number] units of vitamin D daily. Recommend 1200 mg of calcium daily from dietary and supplemental sources.  Plan  Continue current medications  GERD   Patient reports dysphagia, heartburn or nausea, but only if she misses a dose of omeprazole. Expresses understanding to avoid triggers such as citrus juices, large meals and lying  down after eating.  Currently controlled on: . Omeprazole 40 mg daily  .  Plan    Continue current medication.  Osteoarthritis   Patient has failed these meds in past: NA Patient is currently controlled on the following medications:  . APAP 500 mg 1-2 tablets q8hr PRN  . Glucosamine 2 tablets daily   We discussed:  Mostly well managed   Plan  Continue current medications   Misc / OTC   . Cod Liver Oil 1000 mg  . Vitamin B12 2000 mcg daily  . Gabapentin 600 mg (not taking)  . Garlic 7939 mg  . Levocetirizine 5 mg daily  . L-Lysine 500 mg daily  . Magnesium Oxide 500 mg daily  . Potassium Gluconate 2.5 mEq 2 tablets daily  . Prednisolone 1% soln 1 drop both eyes twice daily  . Ropinirole 0.5 mg QHS   Plan  Continue current medications   Medication Management   Pt uses [facility] for all medications. She was previously using [facility], which she enjoyed, but was frustrated due to delivery errors that occurred, so she decided to switch back to [facility].   Uses pill box? Yes Pt endorses 100% compliance  Plan  Continue current medication management strategy  Follow up: 6 month phone visit  [facility] [phone]

## 2020-10-25 NOTE — Progress Notes (Signed)
Name: Ruth [name]   MRN: [id number]    DOB: July 28, 1946   Date:10/26/2020       Progress Note  Subjective  Chief Complaint  Follow Up  HPI  OSA: she started CPAP machine in  May 2020, she was keeping it on for 6 hours, she stopped using months ago, she states she has RLS and neuropathy and has to get up during the night and is too much work to loosen her mask, she is sleeping better now and advised to try resuming the CPAP   GERD: tried to stop omeprazole but unable to tolerated symptoms without medication, advised to try taking it every other day   Polyneuropathy: seen by Dr. Karmen Stabs the past , she states still has some nerve pains, but not as severe , she stopped taking   gabapentin and lyrica because she got drowsy . She has been taking Requip and drinking plenty of water and that has helped with leg cramps.   HTN: bp is at goal, she is compliant with Benicar,  chest pain but notices some palpitation when she first goes to bed, she takes deep breaths and it slows down. She has a history of paroxysmal benign vertigo but she can control it with Epley maneuver at home   Dyslipidemia:she is  not on statin therapy at this time. We started her on medications last year , last LDL was 85 , she will have labs done again   Asthma: under the care of Dr. Kenvir Callas, she states well controlled with medication, no wheezing or SOB but she has an occasional cough. She has Breo daly now, and a rescue inhaler prn , she is up to date with flu and COVID-19   Patient Active Problem List   Diagnosis Date Noted  . OSA on CPAP 04/15/2019  . B12 deficiency 12/09/2018  . Dyslipidemia 11/08/2017  . Vitamin D deficiency 10/31/2016  . Osteopenia of left hip 10/31/2016  . 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  . BPV (benign positional vertigo) 04/17/2016  . RLS (restless legs syndrome) 04/17/2016  . GERD (gastroesophageal reflux disease) 03/11/2016  . History of hyperparathyroidism 03/11/2016  . History  of gastric ulcer 03/11/2016  . Essential hypertension 04/24/2015  . Primary osteoarthritis involving multiple joints 04/24/2015  . Lumbago 04/24/2015  . Family history of colonic polyps     Past Surgical History:  Procedure Laterality Date  . CARPAL TUNNEL RELEASE Bilateral 2000  . CATARACT EXTRACTION W/PHACO Left 04/18/2020   Procedure: CATARACT EXTRACTION PHACO AND INTRAOCULAR LENS PLACEMENT (IOC) LEFT 4.78  00:32.8;  Surgeon: Galen Manila, MD;  Location: [facility];  Service: Ophthalmology;  Laterality: Left;  . CATARACT EXTRACTION W/PHACO Right 05/09/2020   Procedure: CATARACT EXTRACTION PHACO AND INTRAOCULAR LENS PLACEMENT (IOC) RIGHT 3.41 00:21.8;  Surgeon: Galen Manila, MD;  Location: [facility];  Service: Ophthalmology;  Laterality: Right;  . COLONOSCOPY  06/17/2012   A few two mm ulcers were found in the sigmoid colon, no bleeding present,bx taken-path reporet on the few tiny ulcers ,non specific  . COLONOSCOPY WITH PROPOFOL N/A 06/11/2017   Procedure: COLONOSCOPY WITH PROPOFOL;  Surgeon: Kieth Brightly, MD;  Location: [facility];  Service: Endoscopy;  Laterality: N/A;  . parathyroid surgery  2008  . 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 TUBAL LIGATION      Family History  Adopted: Yes  Problem Relation Age of Onset  . Kidney disease Mother   . Ovarian cancer Mother   .  Hypertension Father   . Stroke Father   . Lupus Sister   . Breast cancer Maternal Grandmother   . Breast cancer Cousin        pat cousins    Social History   Tobacco Use  . Smoking status: Never Smoker  . Smokeless tobacco: Never Used  Substance Use Topics  . Alcohol use: Yes    Comment: wine     Current Outpatient Medications:  .  acetaminophen (TYLENOL) 500 MG tablet, Take 1-2 tablets (500-1,000 mg total) by mouth every 8 (eight) hours as needed., Disp: 100 tablet, Rfl: 2 .  albuterol (VENTOLIN HFA) 108 (90 Base) MCG/ACT  inhaler, Inhale 2 puffs into the lungs every 6 (six) hours as needed for wheezing or shortness of breath., Disp: 1 each, Rfl: 0 .  amLODipine (NORVASC) 2.5 MG tablet, Take 1 tablet (2.5 mg total) by mouth daily., Disp: 90 tablet, Rfl: 1 .  Ascorbic Acid (VITAMIN C) 1000 MG tablet, Take 1,000 mg by mouth., Disp: , Rfl:  .  BREO ELLIPTA 200-25 MCG/INH AEPB, Take 1 puff by mouth daily., Disp: , Rfl:  .  Calcium-Vitamin D 600-200 MG-UNIT tablet, Take 1 tablet by mouth 2 (two) times daily. , Disp: , Rfl:  .  Cholecalciferol (VITAMIN D3) 1000 units CAPS, Take by mouth., Disp: , Rfl:  .  Cod Liver Oil 1000 MG CAPS, Take by mouth., Disp: , Rfl:  .  Coenzyme Q10 (COQ10) 200 MG CAPS, Take 200 mg by mouth daily., Disp: , Rfl:  .  Cyanocobalamin (B-12) 2000 MCG TABS, Take 1 tablet by mouth daily., Disp: , Rfl:  .  DM-APAP-CPM (CORICIDIN HBP FLU PO), Take 1 capsule by mouth every 8 (eight) hours as needed (Cough)., Disp: , Rfl:  .  Garlic 1000 MG CAPS, Take 1,000 mg by mouth. , Disp: , Rfl:  .  L-Lysine 500 MG TABS, Take by mouth., Disp: , Rfl:  .  levocetirizine (XYZAL) 5 MG tablet, Take 1 tablet (5 mg total) by mouth every evening., Disp: 90 tablet, Rfl: 0 .  magnesium oxide (MAG-OX) 400 MG tablet, Take 500 mg by mouth daily. , Disp: , Rfl:  .  montelukast (SINGULAIR) 10 MG tablet, Take 1 tablet (10 mg total) by mouth at bedtime., Disp: 90 tablet, Rfl: 1 .  Nutritional Supplements (JOINT FORMULA PO), Take 2 tablets by mouth daily. glucosamine, Disp: , Rfl:  .  olmesartan-hydrochlorothiazide (BENICAR HCT) 20-12.5 MG tablet, Take 1 tablet by mouth daily., Disp: 90 tablet, Rfl: 1 .  omeprazole (PRILOSEC) 40 MG capsule, Take 1 capsule (40 mg total) by mouth daily., Disp: 90 capsule, Rfl: 0 .  Potassium Gluconate 2.5 MEQ TABS, Take 2 tablets by mouth. , Disp: , Rfl:  .  pravastatin (PRAVACHOL) 20 MG tablet, Take 1 tablet (20 mg total) by mouth daily., Disp: 90 tablet, Rfl: 0 .  prednisoLONE acetate (PRED FORTE)  1 % ophthalmic suspension, Instill 1 drop into both eyes twice a day, Disp: , Rfl:  .  rOPINIRole (REQUIP) 0.5 MG tablet, Take 1 tablet (0.5 mg total) by mouth at bedtime., Disp: 90 tablet, Rfl: 1  Current Facility-Administered Medications:  .  cyanocobalamin ((VITAMIN B-12)) injection 1,000 mcg, 1,000 mcg, Intramuscular, Q30 days, Alba Cory, MD, 1,000 mcg at 10/26/20 0930  Allergies  Allergen Reactions  . Ace Inhibitors Cough  . Aspirin Nausea Only    Abdominal pain  . Crestor [Rosuvastatin]     Muscle pain     I personally reviewed active  problem list, medication list, allergies, family history, social history, health maintenance with the patient/caregiver today.   ROS  Constitutional: Negative for fever or weight change.  Respiratory: Negative for cough and shortness of breath.   Cardiovascular: Negative for chest pain or palpitations.  Gastrointestinal: Negative for abdominal pain, no bowel changes.  Musculoskeletal: Negative for gait problem or joint swelling.  Skin: Negative for rash.  Neurological: Negative for dizziness or headache.  No other specific complaints in a complete review of systems (except as listed in HPI above).  Objective  Vitals:   10/26/20 0920  BP: 132/84  Pulse: 90  Resp: 16  Temp: 98 F (36.7 C)  TempSrc: Oral  SpO2: 98%  Weight: 139 lb 1.6 oz (63.1 kg)  Height: 5\' 3"  (1.6 m)    Body mass index is 24.64 kg/m.  Physical Exam  Constitutional: Patient appears well-developed and well-nourished.  No distress.  HEENT: head atraumatic, normocephalic, pupils equal and reactive to light,  neck supple Cardiovascular: Normal rate, regular rhythm and normal heart sounds.  No murmur heard. No BLE edema. Pulmonary/Chest: Effort normal and breath sounds normal. No respiratory distress. Abdominal: Soft.  There is no tenderness. Psychiatric: Patient has a normal mood and affect. behavior is normal. Judgment and thought content  normal.  PHQ2/9: Depression screen [facility] 2/9 10/26/2020 04/24/2020 11/25/2019 10/18/2019 04/15/2019  Decreased Interest 0 0 0 0 0  Down, Depressed, Hopeless 0 0 0 0 0  PHQ - 2 Score 0 0 0 0 0  Altered sleeping - 0 - 0 0  Tired, decreased energy - 0 - 0 0  Change in appetite - 0 - 0 0  Feeling bad or failure about yourself  - 0 - 0 0  Trouble concentrating - 0 - 0 0  Moving slowly or fidgety/restless - 0 - 0 0  Suicidal thoughts - 0 - 0 0  PHQ-9 Score - 0 - 0 0  Difficult doing work/chores - - - Not difficult at all Not difficult at all    phq 9 is negative   Fall Risk: Fall Risk  10/26/2020 04/24/2020 11/25/2019 10/18/2019 11/19/2018  Falls in the past year? 0 0 0 0 0  Number falls in past yr: 0 0 0 0 0  Injury with Fall? 0 0 0 0 0  Risk for fall due to : - - No Fall Risks - -  Follow up - - Falls prevention discussed - Falls prevention discussed     Functional Status Survey: Is the patient deaf or have difficulty hearing?: No Does the patient have difficulty seeing, even when wearing glasses/contacts?: No Does the patient have difficulty concentrating, remembering, or making decisions?: No Does the patient have difficulty walking or climbing stairs?: No Does the patient have difficulty dressing or bathing?: No Does the patient have difficulty doing errands alone such as visiting a doctor's office or shopping?: No    Assessment & Plan  1. Essential hypertension  - COMPLETE METABOLIC PANEL WITH GFR - CBC with Differential/Platelet  2. Vitamin D deficiency  - VITAMIN D 25 Hydroxy (Vit-D Deficiency, Fractures)  3. B12 deficiency  - Vitamin B12  4. OSA (obstructive sleep apnea)  Advised to resume CPAP  5. RLS (restless legs syndrome)  Stable  6. Peripheral polyneuropathy   7. Senile purpura (HCC)   8. DDD (degenerative disc disease), lumbar  Stable, some pain when shifting positions   9. Gastroesophageal reflux disease without esophagitis   10. Osteopenia of left  hip  -  VITAMIN D 25 Hydroxy (Vit-D Deficiency, Fractures)  11. Hypertriglyceridemia  - Lipid panel  12. History of hyperparathyroidism   13. Need for shingles vaccine  - Zoster Vaccine Adjuvanted [facility]) injection; Inject 0.5 mLs into the muscle once for 1 dose.  Dispense: 0.5 mL; Refill: 1

## 2020-10-26 ENCOUNTER — Ambulatory Visit (INDEPENDENT_AMBULATORY_CARE_PROVIDER_SITE_OTHER): Payer: Medicare HMO | Admitting: Family Medicine

## 2020-10-26 ENCOUNTER — Other Ambulatory Visit: Payer: Self-pay

## 2020-10-26 ENCOUNTER — Encounter: Payer: Self-pay | Admitting: Family Medicine

## 2020-10-26 VITALS — BP 132/84 | HR 90 | Temp 98.0°F | Resp 16 | Ht 63.0 in | Wt 139.1 lb

## 2020-10-26 DIAGNOSIS — E559 Vitamin D deficiency, unspecified: Secondary | ICD-10-CM | POA: Diagnosis not present

## 2020-10-26 DIAGNOSIS — G629 Polyneuropathy, unspecified: Secondary | ICD-10-CM

## 2020-10-26 DIAGNOSIS — G2581 Restless legs syndrome: Secondary | ICD-10-CM | POA: Diagnosis not present

## 2020-10-26 DIAGNOSIS — I10 Essential (primary) hypertension: Secondary | ICD-10-CM | POA: Diagnosis not present

## 2020-10-26 DIAGNOSIS — D692 Other nonthrombocytopenic purpura: Secondary | ICD-10-CM | POA: Diagnosis not present

## 2020-10-26 DIAGNOSIS — K219 Gastro-esophageal reflux disease without esophagitis: Secondary | ICD-10-CM

## 2020-10-26 DIAGNOSIS — M85852 Other specified disorders of bone density and structure, left thigh: Secondary | ICD-10-CM | POA: Diagnosis not present

## 2020-10-26 DIAGNOSIS — Z23 Encounter for immunization: Secondary | ICD-10-CM

## 2020-10-26 DIAGNOSIS — E538 Deficiency of other specified B group vitamins: Secondary | ICD-10-CM | POA: Diagnosis not present

## 2020-10-26 DIAGNOSIS — M5136 Other intervertebral disc degeneration, lumbar region: Secondary | ICD-10-CM

## 2020-10-26 DIAGNOSIS — G4733 Obstructive sleep apnea (adult) (pediatric): Secondary | ICD-10-CM | POA: Diagnosis not present

## 2020-10-26 DIAGNOSIS — E781 Pure hyperglyceridemia: Secondary | ICD-10-CM

## 2020-10-26 DIAGNOSIS — Z8639 Personal history of other endocrine, nutritional and metabolic disease: Secondary | ICD-10-CM

## 2020-10-26 MED ORDER — ZOSTER VAC RECOMB ADJUVANTED 50 MCG/0.5ML IM SUSR: 0.5000 mL | Freq: Once | INTRAMUSCULAR | 1 refills | Status: AC

## 2020-10-27 ENCOUNTER — Ambulatory Visit: Payer: Medicare HMO

## 2020-10-31 DIAGNOSIS — R059 Cough, unspecified: Secondary | ICD-10-CM | POA: Diagnosis not present

## 2020-10-31 DIAGNOSIS — K219 Gastro-esophageal reflux disease without esophagitis: Secondary | ICD-10-CM | POA: Diagnosis not present

## 2020-10-31 DIAGNOSIS — J300 Vasomotor rhinitis: Secondary | ICD-10-CM | POA: Diagnosis not present

## 2020-11-06 DIAGNOSIS — G8929 Other chronic pain: Secondary | ICD-10-CM | POA: Diagnosis not present

## 2020-11-06 DIAGNOSIS — Z7951 Long term (current) use of inhaled steroids: Secondary | ICD-10-CM | POA: Diagnosis not present

## 2020-11-06 DIAGNOSIS — M1990 Unspecified osteoarthritis, unspecified site: Secondary | ICD-10-CM | POA: Diagnosis not present

## 2020-11-06 DIAGNOSIS — G2581 Restless legs syndrome: Secondary | ICD-10-CM | POA: Diagnosis not present

## 2020-11-06 DIAGNOSIS — K219 Gastro-esophageal reflux disease without esophagitis: Secondary | ICD-10-CM | POA: Diagnosis not present

## 2020-11-06 DIAGNOSIS — M545 Low back pain, unspecified: Secondary | ICD-10-CM | POA: Diagnosis not present

## 2020-11-06 DIAGNOSIS — E785 Hyperlipidemia, unspecified: Secondary | ICD-10-CM | POA: Diagnosis not present

## 2020-11-06 DIAGNOSIS — I10 Essential (primary) hypertension: Secondary | ICD-10-CM | POA: Diagnosis not present

## 2020-11-06 DIAGNOSIS — J45909 Unspecified asthma, uncomplicated: Secondary | ICD-10-CM | POA: Diagnosis not present

## 2020-11-22 ENCOUNTER — Telehealth: Payer: Self-pay

## 2020-11-22 NOTE — Progress Notes (Signed)
Chronic Care Management Pharmacy Assistant   Name: Ruth [name]  MRN: [id number] DOB: 10/29/1945  Reason for Encounter:Hypertension Disease State  Call. Patient Questions:  1.  Have you seen any other providers since your last visit? Yes, 10/26/2020 PCP Alba Cory  2.  Any changes in your medicines or health? No   PCP : Alba Cory, MD  Allergies:   Allergies  Allergen Reactions  . Ace Inhibitors Cough  . Aspirin Nausea Only    Abdominal pain  . Crestor [Rosuvastatin]     Muscle pain     Medications: Outpatient Encounter Medications as of 11/22/2020  Medication Sig Note  . acetaminophen (TYLENOL) 500 MG tablet Take 1-2 tablets (500-1,000 mg total) by mouth every 8 (eight) hours as needed.   Marland Kitchen albuterol (VENTOLIN HFA) 108 (90 Base) MCG/ACT inhaler Inhale 2 puffs into the lungs every 6 (six) hours as needed for wheezing or shortness of breath.   Marland Kitchen amLODipine (NORVASC) 2.5 MG tablet Take 1 tablet (2.5 mg total) by mouth daily.   . Ascorbic Acid (VITAMIN C) 1000 MG tablet Take 1,000 mg by mouth. 03/11/2016: Received from: [facility]  . BREO ELLIPTA 200-25 MCG/INH AEPB Take 1 puff by mouth daily.   . Calcium-Vitamin D 600-200 MG-UNIT tablet Take 1 tablet by mouth 2 (two) times daily.  03/11/2016: Received from: [facility]  . Cholecalciferol (VITAMIN D3) 1000 units CAPS Take by mouth. 03/11/2016: Received from: [facility]  . Cod Liver Oil 1000 MG CAPS Take by mouth.   . Coenzyme Q10 (COQ10) 200 MG CAPS Take 200 mg by mouth daily.   . Cyanocobalamin (B-12) 2000 MCG TABS Take 1 tablet by mouth daily.   Marland Kitchen DM-APAP-CPM (CORICIDIN HBP FLU PO) Take 1 capsule by mouth every 8 (eight) hours as needed (Cough).   . Garlic 1000 MG CAPS Take 1,000 mg by mouth.    . L-Lysine 500 MG TABS Take by mouth. 03/11/2016: Received from: [facility]  . levocetirizine (XYZAL) 5 MG tablet Take 1 tablet (5 mg total) by mouth  every evening.   . magnesium oxide (MAG-OX) 400 MG tablet Take 500 mg by mouth daily.  03/11/2016: Received from: [facility]  . montelukast (SINGULAIR) 10 MG tablet Take 1 tablet (10 mg total) by mouth at bedtime.   . Nutritional Supplements (JOINT FORMULA PO) Take 2 tablets by mouth daily. glucosamine   . olmesartan-hydrochlorothiazide (BENICAR HCT) 20-12.5 MG tablet Take 1 tablet by mouth daily.   Marland Kitchen omeprazole (PRILOSEC) 40 MG capsule Take 1 capsule (40 mg total) by mouth daily.   . Potassium Gluconate 2.5 MEQ TABS Take 2 tablets by mouth.    . pravastatin (PRAVACHOL) 20 MG tablet Take 1 tablet (20 mg total) by mouth daily.   . prednisoLONE acetate (PRED FORTE) 1 % ophthalmic suspension Instill 1 drop into both eyes twice a day   . rOPINIRole (REQUIP) 0.5 MG tablet Take 1 tablet (0.5 mg total) by mouth at bedtime.    Facility-Administered Encounter Medications as of 11/22/2020  Medication  . cyanocobalamin ((VITAMIN B-12)) injection 1,000 mcg    Current Diagnosis: Patient Active Problem List   Diagnosis Date Noted  . OSA on CPAP 04/15/2019  . B12 deficiency 12/09/2018  . Dyslipidemia 11/08/2017  . Vitamin D deficiency 10/31/2016  . Osteopenia of left hip 10/31/2016  . 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  . BPV (benign positional vertigo) 04/17/2016  . RLS (restless legs  syndrome) 04/17/2016  . GERD (gastroesophageal reflux disease) 03/11/2016  . History of hyperparathyroidism 03/11/2016  . History of gastric ulcer 03/11/2016  . Essential hypertension 04/24/2015  . Primary osteoarthritis involving multiple joints 04/24/2015  . Lumbago 04/24/2015  . Family history of colonic polyps     Goals Addressed   None    Reviewed chart prior to disease state call. Spoke with patient regarding BP  Recent Office Vitals: BP Readings from Last 3 Encounters:  10/26/20 132/84  05/09/20 (!) 126/59  04/24/20 122/84   Pulse Readings from Last 3 Encounters:  10/26/20  90  05/09/20 64  04/24/20 73    Wt Readings from Last 3 Encounters:  10/26/20 139 lb 1.6 oz (63.1 kg)  05/09/20 141 lb (64 kg)  04/24/20 138 lb 11.2 oz (62.9 kg)     Kidney Function Lab Results  Component Value Date/Time   CREATININE 0.73 10/18/2019 10:27 AM   CREATININE 0.86 11/12/2018 09:32 AM   GFRNONAA 82 10/18/2019 10:27 AM   GFRAA 95 10/18/2019 10:27 AM    BMP Latest Ref Rng & Units 10/18/2019 11/12/2018 11/05/2017  Glucose 65 - 99 mg/dL 82 74 83  BUN 7 - 25 mg/dL 14 14 11   Creatinine 0.60 - 0.93 mg/dL 9.16 3.84  BUN/Creat Ratio 6 - 22 (calc) NOT APPLICABLE NOT APPLICABLE NOT APPLICABLE  Sodium 135 - 146 mmol/L 141 142 140  Potassium 3.5 - 5.3 mmol/L 3.7 3.6 3.7  Chloride 98 - 110 mmol/L 105 102 101  CO2 20 - 32 mmol/L 28 28 29   Calcium 8.6 - 10.4 mg/dL 6.65 99.3    . Current antihypertensive regimen:  ? Olmesartan HCT 20/12.5mg  daily ? Amlodipine 2.5 mg daily . How often are you checking your Blood Pressure? 3-5x per week . Current home BP readings:  o Patient states her blood pressure ranges around 124/79. . What recent interventions/DTPs have been made by any provider to improve Blood Pressure control since last CPP Visit: None ID  . Any recent hospitalizations or ED visits since last visit with CPP? No . What diet changes have been made to improve Blood Pressure Control?  o Patient states she is staying away from sweets due to her neuropathy.Patient states she does not eat red meat ,only chicken and fish.Patient states she likes vegetables and fruits. . What exercise is being done to improve your Blood Pressure Control?  o Patient reports she does not exercise.  Patient states she was unable to do her lab work last time she was at the doctor offices but will get the paperwork when she gets her B12 shoot on 11/24/2020 to get her labs done.  Adherence Review: Is the patient currently on ACE/ARB medication? Yes Does the patient have >5 day gap between  last estimated fill dates? Yes   17.7  Follow-Up:  Pharmacist Review   01/24/2021 Clinical Pharmacist Assistant [phone]

## 2020-11-24 ENCOUNTER — Other Ambulatory Visit: Payer: Self-pay

## 2020-11-24 ENCOUNTER — Ambulatory Visit (INDEPENDENT_AMBULATORY_CARE_PROVIDER_SITE_OTHER): Payer: Medicare HMO

## 2020-11-24 DIAGNOSIS — E538 Deficiency of other specified B group vitamins: Secondary | ICD-10-CM | POA: Diagnosis not present

## 2020-11-24 DIAGNOSIS — I10 Essential (primary) hypertension: Secondary | ICD-10-CM | POA: Diagnosis not present

## 2020-11-24 DIAGNOSIS — E781 Pure hyperglyceridemia: Secondary | ICD-10-CM | POA: Diagnosis not present

## 2020-11-24 DIAGNOSIS — E559 Vitamin D deficiency, unspecified: Secondary | ICD-10-CM | POA: Diagnosis not present

## 2020-11-24 DIAGNOSIS — M85852 Other specified disorders of bone density and structure, left thigh: Secondary | ICD-10-CM | POA: Diagnosis not present

## 2020-11-24 MED ORDER — CYANOCOBALAMIN 1000 MCG/ML IJ SOLN
1000.0000 ug | Freq: Once | INTRAMUSCULAR | Status: AC
  Administered 2020-11-24: 1000 ug via INTRAMUSCULAR

## 2020-11-25 LAB — CBC WITH DIFFERENTIAL/PLATELET
Absolute Monocytes: 696 cells/uL (ref 200–950)
Basophils Absolute: 37 cells/uL (ref 0–200)
Basophils Relative: 0.5 %
Eosinophils Absolute: 59 cells/uL (ref 15–500)
Eosinophils Relative: 0.8 %
HCT: 39.5 % (ref 35.0–45.0)
Hemoglobin: 13.6 g/dL (ref 11.7–15.5)
Lymphs Abs: 2449 cells/uL (ref 850–3900)
MCH: 32 pg (ref 27.0–33.0)
MCHC: 34.4 g/dL (ref 32.0–36.0)
MCV: 92.9 fL (ref 80.0–100.0)
MPV: 9.5 fL (ref 7.5–12.5)
Monocytes Relative: 9.4 %
Neutro Abs: 4159 cells/uL (ref 1500–7800)
Neutrophils Relative %: 56.2 %
Platelets: 266 10*3/uL (ref 140–400)
RBC: 4.25 10*6/uL (ref 3.80–5.10)
RDW: 11.7 % (ref 11.0–15.0)
Total Lymphocyte: 33.1 %
WBC: 7.4 10*3/uL (ref 3.8–10.8)

## 2020-11-25 LAB — COMPLETE METABOLIC PANEL WITH GFR
AG Ratio: 1.7 (calc) (ref 1.0–2.5)
ALT: 15 U/L (ref 6–29)
AST: 20 U/L (ref 10–35)
Albumin: 4.4 g/dL (ref 3.6–5.1)
Alkaline phosphatase (APISO): 48 U/L (ref 37–153)
BUN: 13 mg/dL (ref 7–25)
CO2: 27 mmol/L (ref 20–32)
Calcium: 10.1 mg/dL (ref 8.6–10.4)
Chloride: 105 mmol/L (ref 98–110)
Creat: 0.84 mg/dL (ref 0.60–0.93)
GFR, Est African American: 79 mL/min/{1.73_m2} (ref >=60)
GFR, Est Non African American: 68 mL/min/{1.73_m2} (ref >=60)
Globulin: 2.6 g/dL (calc) (ref 1.9–3.7)
Glucose, Bld: 84 mg/dL (ref 65–99)
Potassium: 3.8 mmol/L (ref 3.5–5.3)
Sodium: 142 mmol/L (ref 135–146)
Total Bilirubin: 0.4 mg/dL (ref 0.2–1.2)
Total Protein: 7 g/dL (ref 6.1–8.1)

## 2020-11-25 LAB — VITAMIN B12: Vitamin B-12: 1039 pg/mL (ref 200–1100)

## 2020-11-25 LAB — LIPID PANEL
Cholesterol: 124 mg/dL (ref <200)
HDL: 53 mg/dL (ref >=50)
LDL Cholesterol (Calc): 48 mg/dL (calc)
Non-HDL Cholesterol (Calc): 71 mg/dL (calc) (ref <130)
Total CHOL/HDL Ratio: 2.3 (calc) (ref <5.0)
Triglycerides: 142 mg/dL (ref <150)

## 2020-11-25 LAB — VITAMIN D 25 HYDROXY (VIT D DEFICIENCY, FRACTURES): Vit D, 25-Hydroxy: 44 ng/mL (ref 30–100)

## 2020-11-28 ENCOUNTER — Ambulatory Visit: Payer: Medicare HMO

## 2020-11-30 ENCOUNTER — Ambulatory Visit (INDEPENDENT_AMBULATORY_CARE_PROVIDER_SITE_OTHER): Payer: Medicare HMO

## 2020-11-30 DIAGNOSIS — Z0000 Encounter for general adult medical examination without abnormal findings: Secondary | ICD-10-CM

## 2020-11-30 NOTE — Patient Instructions (Signed)
Ruth [name] , Thank you for taking time to come for your Medicare Wellness Visit. I appreciate your ongoing commitment to your health goals. Please review the following plan we discussed and let me know if I can assist you in the future.   Screening recommendations/referrals: Colonoscopy: done 06/11/17 Mammogram: done 07/10/20 Bone Density: done 06/01/19 Recommended yearly ophthalmology/optometry visit for glaucoma screening and checkup Recommended yearly dental visit for hygiene and checkup  Vaccinations: Influenza vaccine: done 05/25/20 Pneumococcal vaccine: done 12/09/13 Tdap vaccine: done  Shingles vaccine: 04/13/12   Covid-19:done 10/19/19, 11/16/19 & 07/26/20  Advanced directives: Advance directive discussed with you today. I have provided a copy for you to complete at home and have notarized. Once this is complete please bring a copy in to our office so we can scan it into your chart.  Conditions/risks identified: Recommend increasing physical activity   Next appointment: Follow up in one year for your annual wellness visit    Preventive Care 65 Years and Older, Female Preventive care refers to lifestyle choices and visits with your health care provider that can promote health and wellness. What does preventive care include?  A yearly physical exam. This is also called an annual well check.  Dental exams once or twice a year.  Routine eye exams. Ask your health care provider how often you should have your eyes checked.  Personal lifestyle choices, including:  Daily care of your teeth and gums.  Regular physical activity.  Eating a healthy diet.  Avoiding tobacco and drug use.  Limiting alcohol use.  Practicing safe sex.  Taking low-dose aspirin every day.  Taking vitamin and mineral supplements as recommended by your health care provider. What happens during an annual well check? The services and screenings done by your health care provider during your annual well check  will depend on your age, overall health, lifestyle risk factors, and family history of disease. Counseling  Your health care provider may ask you questions about your:  Alcohol use.  Tobacco use.  Drug use.  Emotional well-being.  Home and relationship well-being.  Sexual activity.  Eating habits.  History of falls.  Memory and ability to understand (cognition).  Work and work Astronomer.  Reproductive health. Screening  You may have the following tests or measurements:  Height, weight, and BMI.  Blood pressure.  Lipid and cholesterol levels. These may be checked every 5 years, or more frequently if you are over 74 years old.  Skin check.  Lung cancer screening. You may have this screening every year starting at age 744 if you have a 30-pack-year history of smoking and currently smoke or have quit within the past 15 years.  Fecal occult blood test (FOBT) of the stool. You may have this test every year starting at age 74.  Flexible sigmoidoscopy or colonoscopy. You may have a sigmoidoscopy every 5 years or a colonoscopy every 10 years starting at age 744.  Hepatitis C blood test.  Hepatitis B blood test.  Sexually transmitted disease (STD) testing.  Diabetes screening. This is done by checking your blood sugar (glucose) after you have not eaten for a while (fasting). You may have this done every 1-3 years.  Bone density scan. This is done to screen for osteoporosis. You may have this done starting at age 744.  Mammogram. This may be done every 1-2 years. Talk to your health care provider about how often you should have regular mammograms. Talk with your health care provider about your test results, treatment  options, and if necessary, the need for more tests. Vaccines  Your health care provider may recommend certain vaccines, such as:  Influenza vaccine. This is recommended every year.  Tetanus, diphtheria, and acellular pertussis (Tdap, Td) vaccine. You may  need a Td booster every 10 years.  Zoster vaccine. You may need this after age 74.  Pneumococcal 13-valent conjugate (PCV13) vaccine. One dose is recommended after age 74.  Pneumococcal polysaccharide (PPSV23) vaccine. One dose is recommended after age 74. Talk to your health care provider about which screenings and vaccines you need and how often you need them. This information is not intended to replace advice given to you by your health care provider. Make sure you discuss any questions you have with your health care provider. Document Released: 10/06/2015 Document Revised: 05/29/2016 Document Reviewed: 07/11/2015 Elsevier Interactive Patient Education  2017 [employer] Prevention in the Home Falls can cause injuries. They can happen to people of all ages. There are many things you can do to make your home safe and to help prevent falls. What can I do on the outside of my home?  Regularly fix the edges of walkways and driveways and fix any cracks.  Remove anything that might make you trip as you walk through a door, such as a raised step or threshold.  Trim any bushes or trees on the path to your home.  Use bright outdoor lighting.  Clear any walking paths of anything that might make someone trip, such as rocks or tools.  Regularly check to see if handrails are loose or broken. Make sure that both sides of any steps have handrails.  Any raised decks and porches should have guardrails on the edges.  Have any leaves, snow, or ice cleared regularly.  Use sand or salt on walking paths during winter.  Clean up any spills in your garage right away. This includes oil or grease spills. What can I do in the bathroom?  Use night lights.  Install grab bars by the toilet and in the tub and shower. Do not use towel bars as grab bars.  Use non-skid mats or decals in the tub or shower.  If you need to sit down in the shower, use a plastic, non-slip stool.  Keep the floor  dry. Clean up any water that spills on the floor as soon as it happens.  Remove soap buildup in the tub or shower regularly.  Attach bath mats securely with double-sided non-slip rug tape.  Do not have throw rugs and other things on the floor that can make you trip. What can I do in the bedroom?  Use night lights.  Make sure that you have a light by your bed that is easy to reach.  Do not use any sheets or blankets that are too big for your bed. They should not hang down onto the floor.  Have a firm chair that has side arms. You can use this for support while you get dressed.  Do not have throw rugs and other things on the floor that can make you trip. What can I do in the kitchen?  Clean up any spills right away.  Avoid walking on wet floors.  Keep items that you use a lot in easy-to-reach places.  If you need to reach something above you, use a strong step stool that has a grab bar.  Keep electrical cords out of the way.  Do not use floor polish or wax that makes floors slippery.  If you must use wax, use non-skid floor wax.  Do not have throw rugs and other things on the floor that can make you trip. What can I do with my stairs?  Do not leave any items on the stairs.  Make sure that there are handrails on both sides of the stairs and use them. Fix handrails that are broken or loose. Make sure that handrails are as long as the stairways.  Check any carpeting to make sure that it is firmly attached to the stairs. Fix any carpet that is loose or worn.  Avoid having throw rugs at the top or bottom of the stairs. If you do have throw rugs, attach them to the floor with carpet tape.  Make sure that you have a light switch at the top of the stairs and the bottom of the stairs. If you do not have them, ask someone to add them for you. What else can I do to help prevent falls?  Wear shoes that:  Do not have high heels.  Have rubber bottoms.  Are comfortable and fit you  well.  Are closed at the toe. Do not wear sandals.  If you use a stepladder:  Make sure that it is fully opened. Do not climb a closed stepladder.  Make sure that both sides of the stepladder are locked into place.  Ask someone to hold it for you, if possible.  Clearly mark and make sure that you can see:  Any grab bars or handrails.  First and last steps.  Where the edge of each step is.  Use tools that help you move around (mobility aids) if they are needed. These include:  Canes.  Walkers.  Scooters.  Crutches.  Turn on the lights when you go into a dark area. Replace any light bulbs as soon as they burn out.  Set up your furniture so you have a clear path. Avoid moving your furniture around.  If any of your floors are uneven, fix them.  If there are any pets around you, be aware of where they are.  Review your medicines with your doctor. Some medicines can make you feel dizzy. This can increase your chance of falling. Ask your doctor what other things that you can do to help prevent falls. This information is not intended to replace advice given to you by your health care provider. Make sure you discuss any questions you have with your health care provider. Document Released: 07/06/2009 Document Revised: 02/15/2016 Document Reviewed: 10/14/2014 Elsevier Interactive Patient Education  2017 [employer].

## 2020-11-30 NOTE — Progress Notes (Signed)
Subjective:   Ruth [name] is a 75 y.o. female who presents for Medicare Annual (Subsequent) preventive examination.  Virtual Visit via Telephone Note  I connected with  Ruth [name] on 11/30/20 at  2:50 PM EST by telephone and verified that I am speaking with the correct person using two identifiers.  Location: Patient: home Provider: [facility] Persons participating in the virtual visit: patient/Nurse Health Advisor   I discussed the limitations, risks, security and privacy concerns of performing an evaluation and management service by telephone and the availability of in person appointments. The patient expressed understanding and agreed to proceed.  Interactive audio and video telecommunications were attempted between this nurse and patient, however failed, due to patient having technical difficulties OR patient did not have access to video capability.  We continued and completed visit with audio only.  Some vital signs may be absent or patient reported.   Reather Littler, LPN    Review of Systems     Cardiac Risk Factors include: advanced age (>32men, >50 women);hypertension;dyslipidemia     Objective:    There were no vitals filed for this visit. There is no height or weight on file to calculate BMI.  Advanced Directives 11/30/2020 05/09/2020 04/18/2020 11/25/2019 11/19/2018 06/23/2017 06/18/2017  Does Patient Have a Medical Advance Directive? No No No No No Yes Yes  Type of Advance Directive - - - - - - Living will  Copy of Healthcare Power of Attorney in Chart? - - - - - - -  Would patient like information on creating a medical advance directive? Yes (MAU/Ambulatory/Procedural Areas - Information given) No - Patient declined Yes (MAU/Ambulatory/Procedural Areas - Information given) Yes (MAU/Ambulatory/Procedural Areas - Information given) Yes (MAU/Ambulatory/Procedural Areas - Information given) - -    Current Medications (verified) Outpatient Encounter Medications as of  11/30/2020  Medication Sig  . acetaminophen (TYLENOL) 500 MG tablet Take 1-2 tablets (500-1,000 mg total) by mouth every 8 (eight) hours as needed.  Marland Kitchen albuterol (VENTOLIN HFA) 108 (90 Base) MCG/ACT inhaler Inhale 2 puffs into the lungs every 6 (six) hours as needed for wheezing or shortness of breath.  Marland Kitchen amLODipine (NORVASC) 2.5 MG tablet Take 1 tablet (2.5 mg total) by mouth daily.  . Ascorbic Acid (VITAMIN C) 1000 MG tablet Take 1,000 mg by mouth.  Marland Kitchen BREO ELLIPTA 200-25 MCG/INH AEPB Take 1 puff by mouth daily.  . Calcium-Vitamin D 600-200 MG-UNIT tablet Take 1 tablet by mouth 2 (two) times daily.   . Cholecalciferol (VITAMIN D3) 1000 units CAPS Take by mouth.  Marland Kitchen Cod Liver Oil 1000 MG CAPS Take by mouth.  . Coenzyme Q10 (COQ10) 200 MG CAPS Take 200 mg by mouth daily.  . Cyanocobalamin (B-12) 2000 MCG TABS Take 1 tablet by mouth daily.  . Garlic 1000 MG CAPS Take 1,000 mg by mouth.   . L-Lysine 500 MG TABS Take by mouth.  . levocetirizine (XYZAL) 5 MG tablet Take 1 tablet (5 mg total) by mouth every evening.  . magnesium oxide (MAG-OX) 400 MG tablet Take 500 mg by mouth daily.   . montelukast (SINGULAIR) 10 MG tablet Take 1 tablet (10 mg total) by mouth at bedtime.  . Nutritional Supplements (JOINT FORMULA PO) Take 2 tablets by mouth daily. glucosamine  . olmesartan-hydrochlorothiazide (BENICAR HCT) 20-12.5 MG tablet Take 1 tablet by mouth daily.  Marland Kitchen omeprazole (PRILOSEC) 40 MG capsule Take 1 capsule (40 mg total) by mouth daily.  . Potassium Gluconate 2.5 MEQ TABS Take 1 tablet by  mouth. Pt taking 1 tab 99 mg daily  . pravastatin (PRAVACHOL) 20 MG tablet Take 1 tablet (20 mg total) by mouth daily.  . prednisoLONE acetate (PRED FORTE) 1 % ophthalmic suspension Instill 1 drop into both eyes twice a day  . rOPINIRole (REQUIP) 0.5 MG tablet Take 1 tablet (0.5 mg total) by mouth at bedtime.  . [DISCONTINUED] DM-APAP-CPM (CORICIDIN HBP FLU PO) Take 1 capsule by mouth every 8 (eight) hours as needed  (Cough).   Facility-Administered Encounter Medications as of 11/30/2020  Medication  . cyanocobalamin ((VITAMIN B-12)) injection 1,000 mcg    Allergies (verified) Ace inhibitors, Aspirin, and Crestor [rosuvastatin]   History: Past Medical History:  Diagnosis Date  . Allergy    mycins; cause diarrhea  . Arthritis    since 2007  . Asthma   . Breast screening, unspecified   . Family history of colonic polyps   . GERD (gastroesophageal reflux disease)   . History of cystitis 1987  . Hypertension    since 2005  . Neuropathy    feet  . 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 Restless leg syndrome   . Scoliosis   . Sleep apnea    CPAP  . Ulcer 1966   Past Surgical History:  Procedure Laterality Date  . CARPAL TUNNEL RELEASE Bilateral 2000  . CATARACT EXTRACTION W/PHACO Left 04/18/2020   Procedure: CATARACT EXTRACTION PHACO AND INTRAOCULAR LENS PLACEMENT (IOC) LEFT 4.78  00:32.8;  Surgeon: Galen Manila, MD;  Location: [facility];  Service: Ophthalmology;  Laterality: Left;  . CATARACT EXTRACTION W/PHACO Right 05/09/2020   Procedure: CATARACT EXTRACTION PHACO AND INTRAOCULAR LENS PLACEMENT (IOC) RIGHT 3.41 00:21.8;  Surgeon: Galen Manila, MD;  Location: [facility];  Service: Ophthalmology;  Laterality: Right;  . COLONOSCOPY  06/17/2012   A few two mm ulcers were found in the sigmoid colon, no bleeding present,bx taken-path reporet on the few tiny ulcers ,non specific  . COLONOSCOPY WITH PROPOFOL N/A 06/11/2017   Procedure: COLONOSCOPY WITH PROPOFOL;  Surgeon: Kieth Brightly, MD;  Location: [facility];  Service: Endoscopy;  Laterality: N/A;  . parathyroid surgery  2008  . 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 TUBAL LIGATION     Family History  Adopted: Yes  Problem Relation Age of Onset  . Kidney disease Mother   . Ovarian cancer Mother   . Hypertension Father   . Stroke Father   . Lupus Sister    . Breast cancer Maternal Grandmother   . Breast cancer Cousin        pat cousins   Social History   Socioeconomic History  . Marital status: Married    Spouse name: Maleiya Pergola   . Number of children: 1  . Years of education: Not on file  . Highest education level: Some college, no degree  Occupational History  . Not on file  Tobacco Use  . Smoking status: Never Smoker  . Smokeless tobacco: Never Used  Vaping Use  . Vaping Use: Never used  Substance and Sexual Activity  . Alcohol use: Yes    Comment: wine  . Drug use: No  . Sexual activity: Yes    Partners: Male    Birth control/protection: Post-menopausal  Other Topics Concern  . Not on file  Social History Narrative   Married, retired, involved in church and has one grown son    Social Determinants of Corporate investment banker Strain: Low Risk   .  Difficulty of Paying Living Expenses: Not hard at all  Food Insecurity: No Food Insecurity  . Worried About Programme researcher, broadcasting/film/video in the Last Year: Never true  . Ran Out of Food in the Last Year: Never true  Transportation Needs: No Transportation Needs  . Lack of Transportation (Medical): No  . Lack of Transportation (Non-Medical): No  Physical Activity: Inactive  . Days of Exercise per Week: 0 days  . Minutes of Exercise per Session: 0 min  Stress: No Stress Concern Present  . Feeling of Stress : Not at all  Social Connections: Socially Integrated  . Frequency of Communication with Friends and Family: More than three times a week  . Frequency of Social Gatherings with Friends and Family: More than three times a week  . Attends Religious Services: More than 4 times per year  . Active Member of Clubs or Organizations: Yes  . Attends Banker Meetings: More than 4 times per year  . Marital Status: Married    Tobacco Counseling Counseling given: Not Answered   Clinical Intake:  Pre-visit preparation completed: Yes  Pain : No/denies pain      Nutritional Risks: None Diabetes: No  How often do you need to have someone help you when you read instructions, pamphlets, or other written materials from your doctor or pharmacy?: 1 - Never    Interpreter Needed?: No  Information entered by :: Reather Littler LPN   Activities of Daily Living In your present state of health, do you have any difficulty performing the following activities: 11/30/2020 10/26/2020  Hearing? N N  Comment declines hearing aids -  Vision? N N  Difficulty concentrating or making decisions? N N  Walking or climbing stairs? N N  Dressing or bathing? N N  Doing errands, shopping? N N  Preparing Food and eating ? N -  Using the Toilet? N -  In the past six months, have you accidently leaked urine? N -  Do you have problems with loss of bowel control? N -  Managing your Medications? N -  Managing your Finances? N -  Housekeeping or managing your Housekeeping? N -  Some recent data might be hidden    Patient Care Team: Alba Cory, MD as PCP - General (Family Medicine) Sidney Ace, MD as Referring Physician (Allergy) Lonell Face, MD as Consulting Physician (Neurology) Gaspar Cola, [facility] as Pharmacist (Pharmacist)  Indicate any recent Medical Services you may have received from other than [facility] providers in the past year (date may be approximate).     Assessment:   This is a routine wellness examination for Ruth [name].  Hearing/Vision screen  Hearing Screening             Right ear:           Left ear:           Comments: Pt denies hearing difficulty  Vision Screening Comments: Vision screenings done by [facility]  Dietary issues and exercise activities discussed: Current Exercise Habits: The patient does not participate in regular exercise at present, Exercise limited by: respiratory conditions(s)  Goals    . Chronic Care Management     CARE PLAN ENTRY (see longitudinal  plan of care for additional care plan information)  Current Barriers:  . Chronic Disease Management support, education, and care coordination needs related to Hypertension, Hyperlipidemia, GERD, Osteopenia, Osteoarthritis, and Chronic Bronchitis, and Restless Legs Syndrome    Hypertension BP Readings from Last 3 Encounters:  11/25/19 122/72  10/18/19 130/70  04/15/19 134/78   . Pharmacist Clinical Goal(s): o Over the next 90 days, patient will work with PharmD and providers to maintain BP goal <130/80 . Current regimen:  o Olmesartan HCT 20/12.5mg  daily o Amlodipine 2.5 mg daily . Interventions: o None . Patient self care activities - Over the next 90 days, patient will: o Check BP weekly, document, and provide at future appointments o Ensure daily salt intake < 2300 mg/day  Hyperlipidemia Lab Results  Component Value Date/Time   LDLCALC 85 10/18/2019 10:27 AM   . Pharmacist Clinical Goal(s): o Over the next 90 days, patient will work with PharmD and providers to maintain LDL goal < 100 . Current regimen:  o Pravastatin 5mg  daily . Interventions: o None  Osteopenia . Pharmacist Clinical Goal(s) o Over the next 90 days, patient will work with PharmD and providers to maintain bone health and prevent fractures. . Current regimen:  o Calcium 600/200 mg/iu twice daily o Vitamin D3 1000iu daily . Interventions: o None . Patient self care activities - Over the next 90 days, patient will: o Continue to eat dairy when possible and take calcium and vitamin D supplements  Medication management . Pharmacist Clinical Goal(s): o Over the next 90 days, patient will work with PharmD and providers to achieve optimal medication adherence . Current pharmacy: [facility] . Interventions o Comprehensive medication review performed. o Continue current medication management strategy . Patient self care activities - Over the next 90 days, patient will: o Work with PharmD on transition to  [facility] pharmacy o Take medications as prescribed o Report any questions or concerns to PharmD and/or provider(s)    . DIET - INCREASE WATER INTAKE     Recommend drinking 6-8 glasses of water per day      Depression Screen PHQ 2/9 Scores 11/30/2020 10/26/2020 04/24/2020 11/25/2019 10/18/2019 04/15/2019 11/19/2018  PHQ - 2 Score 0 0 0 0 0 0 0  PHQ- 9 Score - - 0 - 0 0 -    Fall Risk Fall Risk  11/30/2020 10/26/2020 04/24/2020 11/25/2019 10/18/2019  Falls in the past year? 0 0 0 0 0  Number falls in past yr: 0 0 0 0 0  Injury with Fall? 0 0 0 0 0  Risk for fall due to : No Fall Risks - - No Fall Risks -  Follow up Falls prevention discussed - - Falls prevention discussed -    FALL RISK PREVENTION PERTAINING TO THE HOME:  Any stairs in or around the home? Yes  If so, are there any without handrails? No  Home free of loose throw rugs in walkways, pet beds, electrical cords, etc? Yes  Adequate lighting in your home to reduce risk of falls? Yes   ASSISTIVE DEVICES UTILIZED TO PREVENT FALLS:  Life alert? No  Use of a cane, walker or w/c? No  Grab bars in the bathroom? No  Shower chair or bench in shower? Yes  Elevated toilet seat or a handicapped toilet? No   TIMED UP AND GO:  Was the test performed? No .  Telephonic visit.   Cognitive Function: Normal cognitive status assessed by direct observation by this Nurse Health Advisor. No abnormalities found.       6CIT Screen 11/25/2019 11/19/2018  What Year? 0 points 0 points  What month? 0 points 0 points  What time? 0 points 0 points  Count back from 20 0 points 0 points  Months in reverse 2 points 0  points  Repeat phrase 2 points 0 points  Total Score 4 0    Immunizations Immunization History  Administered Date(s) Administered  . Fluad Quad(high Dose 65+) 05/25/2020  . Influenza, High Dose Seasonal PF 06/05/2015, 06/18/2016, 06/17/2017, 06/09/2018, 06/16/2019  . Influenza-Unspecified 06/05/2015  . Moderna Sars-Covid-2 Vaccination  10/19/2019, 11/16/2019, 07/26/2020  . Pneumococcal Conjugate-13 12/09/2013  . Pneumococcal Polysaccharide-23 04/13/2012  . Tdap 04/13/2012  . Zoster 07/24/2013    TDAP status: Up to date  Flu Vaccine status: Up to date  Pneumococcal vaccine status: Up to date  Covid-19 vaccine status: Completed vaccines  Qualifies for Shingles Vaccine? Yes   Zostavax completed Yes   Shingrix Completed?: No.    Education has been provided regarding the importance of this vaccine. Patient has been advised to call insurance company to determine out of pocket expense if they have not yet received this vaccine. Advised may also receive vaccine at local pharmacy or Health Dept. Verbalized acceptance and understanding.  Screening Tests Health Maintenance  Topic Date Due  . MAMMOGRAM  07/10/2021  . TETANUS/TDAP  04/13/2022  . COLONOSCOPY (Pts 45-15yrs Insurance coverage will need to be confirmed)  06/12/2027  . INFLUENZA VACCINE  Completed  . DEXA SCAN  Completed  . COVID-19 Vaccine  Completed  . Hepatitis C Screening  Completed  . PNA vac Low Risk Adult  Completed  . HPV VACCINES  Aged Out    Health Maintenance  There are no preventive care reminders to display for this patient.   Colorectal cancer screening: Type of screening: Colonoscopy. Completed 06/11/17. Repeat every 10 years  Mammogram status: Completed 07/10/20. Repeat every year  Bone Density status: Completed 06/01/19. Results reflect: Bone density results: OSTEOPENIA. Repeat every 2 years.  Lung Cancer Screening: (Low Dose CT Chest recommended if Age 28-80 years, 30 pack-year currently smoking OR have quit w/in 15years.) does not qualify.   Additional Screening:  Hepatitis C Screening: does qualify; Completed 04/10/12  Vision Screening: Recommended annual ophthalmology exams for early detection of glaucoma and other disorders of the eye. Is the patient up to date with their annual eye exam?  Yes  Who is the provider or what is the  name of the office in which the patient attends annual eye exams? [facility]  Dental Screening: Recommended annual dental exams for proper oral hygiene  Community Resource Referral / Chronic Care Management: CRR required this visit?  No   CCM required this visit?  No      Plan:     I have personally reviewed and noted the following in the patient's chart:   . Medical and social history . Use of alcohol, tobacco or illicit drugs  . Current medications and supplements . Functional ability and status . Nutritional status . Physical activity . Advanced directives . List of other physicians . Hospitalizations, surgeries, and ER visits in previous 12 months . Vitals . Screenings to include cognitive, depression, and falls . Referrals and appointments  In addition, I have reviewed and discussed with patient certain preventive protocols, quality metrics, and best practice recommendations. A written personalized care plan for preventive services as well as general preventive health recommendations were provided to patient.     Reather Littler, LPN   6/81/2751   Nurse Notes: none

## 2020-12-11 ENCOUNTER — Telehealth: Payer: Self-pay | Admitting: Family Medicine

## 2020-12-11 ENCOUNTER — Other Ambulatory Visit: Payer: Self-pay | Admitting: Family Medicine

## 2020-12-11 DIAGNOSIS — I10 Essential (primary) hypertension: Secondary | ICD-10-CM

## 2020-12-11 NOTE — Telephone Encounter (Signed)
Disregard

## 2020-12-18 ENCOUNTER — Other Ambulatory Visit: Payer: Self-pay | Admitting: Family Medicine

## 2020-12-25 ENCOUNTER — Other Ambulatory Visit: Payer: Self-pay

## 2020-12-25 ENCOUNTER — Ambulatory Visit (INDEPENDENT_AMBULATORY_CARE_PROVIDER_SITE_OTHER): Payer: Medicare HMO

## 2020-12-25 DIAGNOSIS — E538 Deficiency of other specified B group vitamins: Secondary | ICD-10-CM

## 2020-12-25 DIAGNOSIS — K219 Gastro-esophageal reflux disease without esophagitis: Secondary | ICD-10-CM

## 2020-12-25 MED ORDER — OMEPRAZOLE 40 MG PO CPDR: 40.0000 mg | DELAYED_RELEASE_CAPSULE | Freq: Every day | ORAL | 0 refills | Status: DC

## 2021-01-03 DIAGNOSIS — H35373 Puckering of macula, bilateral: Secondary | ICD-10-CM | POA: Diagnosis not present

## 2021-01-03 DIAGNOSIS — Z961 Presence of intraocular lens: Secondary | ICD-10-CM | POA: Diagnosis not present

## 2021-01-25 ENCOUNTER — Telehealth: Payer: Self-pay

## 2021-01-25 NOTE — Progress Notes (Signed)
Chronic Care Management Pharmacy Assistant   Name: Ruth [name]  MRN: [id number] DOB: 1945-10-28  Reason for Encounter:Chronic bronchitis Disease State Call.  Recent office visits:  11/30/2020 Reather Littler LPN (PCP Office)  Recent consult visits:  No recent consult visit  Hospital visits:  None in previous 6 months  Medications: Outpatient Encounter Medications as of 01/25/2021  Medication Sig  . acetaminophen (TYLENOL) 500 MG tablet Take 1-2 tablets (500-1,000 mg total) by mouth every 8 (eight) hours as needed.  Marland Kitchen albuterol (VENTOLIN HFA) 108 (90 Base) MCG/ACT inhaler Inhale 2 puffs into the lungs every 6 (six) hours as needed for wheezing or shortness of breath.  Marland Kitchen amLODipine (NORVASC) 2.5 MG tablet Take 1 tablet (2.5 mg total) by mouth daily.  . Ascorbic Acid (VITAMIN C) 1000 MG tablet Take 1,000 mg by mouth.  Marland Kitchen BREO ELLIPTA 200-25 MCG/INH AEPB Take 1 puff by mouth daily.  . Calcium-Vitamin D 600-200 MG-UNIT tablet Take 1 tablet by mouth 2 (two) times daily.   . Cholecalciferol (VITAMIN D3) 1000 units CAPS Take by mouth.  Marland Kitchen Cod Liver Oil 1000 MG CAPS Take by mouth.  . Coenzyme Q10 (COQ10) 200 MG CAPS Take 200 mg by mouth daily.  . Cyanocobalamin (B-12) 2000 MCG TABS Take 1 tablet by mouth daily.  . Garlic 1000 MG CAPS Take 1,000 mg by mouth.   . L-Lysine 500 MG TABS Take by mouth.  . levocetirizine (XYZAL) 5 MG tablet Take 1 tablet (5 mg total) by mouth every evening.  . magnesium oxide (MAG-OX) 400 MG tablet Take 500 mg by mouth daily.   . montelukast (SINGULAIR) 10 MG tablet Take 1 tablet (10 mg total) by mouth at bedtime.  . Nutritional Supplements (JOINT FORMULA PO) Take 2 tablets by mouth daily. glucosamine  . olmesartan-hydrochlorothiazide (BENICAR HCT) 20-12.5 MG tablet Take 1 tablet by mouth once daily  . omeprazole (PRILOSEC) 40 MG capsule Take 1 capsule (40 mg total) by mouth daily.  . Potassium Gluconate 2.5 MEQ TABS Take 1 tablet by mouth. Pt taking 1 tab 99 mg  daily  . pravastatin (PRAVACHOL) 20 MG tablet Take 1 tablet by mouth once daily  . prednisoLONE acetate (PRED FORTE) 1 % ophthalmic suspension Instill 1 drop into both eyes twice a day  . rOPINIRole (REQUIP) 0.5 MG tablet Take 1 tablet (0.5 mg total) by mouth at bedtime.   Facility-Administered Encounter Medications as of 01/25/2021  Medication  . cyanocobalamin ((VITAMIN B-12)) injection 1,000 mcg    Star Rating Drugs: Pravastatin 20 mg last filled on 12/26/2020 for 90 day supply at [facility].  . Current Chronic bronchitis regimen:   Albuterol HFA 108 mcg/act 2 puff q6hr PRN   Breo Ellipta 200-25 mcg/act 1 puff daily   Montelukast 10 mg QHS  . No flowsheet data found. . Any recent hospitalizations or ED visits since last visit with CPP? No . Denies Chronic bronchitis  symptoms, including Increased shortness of breath , Rescue medicine is not helping, Shortness of breath at rest, Symptoms worse with exercise, Symptoms worse at night and Wheezing . What recent interventions/DTPs have been made by any provider to improve breathing since last visit:None ID . Have you had exacerbation/flare-up since last visit? No  o Patient reports she not having any issue with bronchitis, but she is with her allergies.Patient states her symptoms consist of sneezing,runny nose ,burning nose, a little cough,itcy eyes.Patient denies fever,lost of taste and smell, and fatigue.Patient states she is currently taking her xyzal,  montelukast and she use her inhaler to help with symptoms.  . What do you do when you are short of breath?  Rescue medication   Respiratory Devices/Equipment . Do you have a nebulizer? No . Do you use a Peak Flow Meter? No . Do you use a maintenance inhaler? Yes . How often do you forget to use your daily inhaler? Never . Do you use a rescue inhaler? Yes . How often do you use your rescue inhaler?  prn . Do you use a spacer with your inhaler? No  Patient states her inhalers  are affordable at this time. Patient states she would not get approved for PAP for her inhalers due to her and her husband incomes.Patient states when she has a follow up with Dr [place] Callas once a year he always gives her one inhaler of Breo for free. Adherence Review: . Does the patient have >5 day gap between last estimated fill date for maintenance inhaler medications? Yes   Everlean Cherry Clinical Pharmacist Assistant [phone]

## 2021-01-31 ENCOUNTER — Encounter: Payer: Self-pay | Admitting: Family Medicine

## 2021-01-31 ENCOUNTER — Ambulatory Visit (INDEPENDENT_AMBULATORY_CARE_PROVIDER_SITE_OTHER): Payer: Medicare HMO | Admitting: Family Medicine

## 2021-01-31 ENCOUNTER — Other Ambulatory Visit: Payer: Self-pay

## 2021-01-31 VITALS — BP 124/78 | HR 74 | Temp 98.2°F | Resp 16 | Ht 63.0 in | Wt 140.2 lb

## 2021-01-31 DIAGNOSIS — Z23 Encounter for immunization: Secondary | ICD-10-CM | POA: Diagnosis not present

## 2021-01-31 DIAGNOSIS — E538 Deficiency of other specified B group vitamins: Secondary | ICD-10-CM

## 2021-01-31 DIAGNOSIS — Z1231 Encounter for screening mammogram for malignant neoplasm of breast: Secondary | ICD-10-CM

## 2021-01-31 DIAGNOSIS — Z0000 Encounter for general adult medical examination without abnormal findings: Secondary | ICD-10-CM

## 2021-01-31 DIAGNOSIS — N764 Abscess of vulva: Secondary | ICD-10-CM | POA: Diagnosis not present

## 2021-01-31 MED ORDER — CYANOCOBALAMIN 1000 MCG/ML IJ SOLN
1000.0000 ug | Freq: Once | INTRAMUSCULAR | Status: AC
  Administered 2021-01-31: 1000 ug via INTRAMUSCULAR

## 2021-01-31 MED ORDER — SULFAMETHOXAZOLE-TRIMETHOPRIM 800-160 MG PO TABS
1.0000 | ORAL_TABLET | Freq: Two times a day (BID) | ORAL | 0 refills | Status: DC
Start: 2021-01-31 — End: 2021-04-12

## 2021-01-31 MED ORDER — SHINGRIX 50 MCG/0.5ML IM SUSR: 0.5000 mL | Freq: Once | INTRAMUSCULAR | 1 refills | Status: AC

## 2021-01-31 MED ORDER — METRONIDAZOLE 500 MG PO TABS: 500.0000 mg | ORAL_TABLET | Freq: Three times a day (TID) | ORAL | 0 refills | Status: AC

## 2021-01-31 NOTE — Progress Notes (Signed)
Name: Ruth [name]   MRN: [id number]    DOB: Sep 27, 1945   Date:01/31/2021       Progress Note  Subjective  Chief Complaint  Annual Exam  HPI  Patient presents for annual CPE and boil - patient is aware it may have additional cost   She states noticed a small bump about 6 weeks ago, tender to touch and growing in size, she states it burns when she voids, tried some back fat but did not work. No fever or chills. She is unable to see the area  B12 deficiency: discussed switching to SL B12 instead of injections and she will try next month   Diet: balanced  Exercise: needs to increase physical activity   [place] Office Visit from 04/24/2020 in [facility]  AUDIT-C Score 0     Depression: Phq 9 is  negative Depression screen [facility] 2/9 01/31/2021 11/30/2020 10/26/2020 04/24/2020 11/25/2019  Decreased Interest 0 0 0 0 0  Down, Depressed, Hopeless 0 0 0 0 0  PHQ - 2 Score 0 0 0 0 0  Altered sleeping 0 - - 0 -  Tired, decreased energy 0 - - 0 -  Change in appetite 0 - - 0 -  Feeling bad or failure about yourself  0 - - 0 -  Trouble concentrating 0 - - 0 -  Moving slowly or fidgety/restless 0 - - 0 -  Suicidal thoughts 0 - - 0 -  PHQ-9 Score 0 - - 0 -  Difficult doing work/chores Not difficult at all - - - -   Hypertension: BP Readings from Last 3 Encounters:  01/31/21 124/78  10/26/20 132/84  05/09/20 (!) 126/59   Obesity: Wt Readings from Last 3 Encounters:  01/31/21 140 lb 3.2 oz (63.6 kg)  10/26/20 139 lb 1.6 oz (63.1 kg)  05/09/20 141 lb (64 kg)   BMI Readings from Last 3 Encounters:  01/31/21 24.84 kg/m  10/26/20 24.64 kg/m  05/09/20 27.54 kg/m     Vaccines:  Shingrix: she will go to local pharmacy  Pneumonia: educated and discussed with patient. Flu: educated and discussed with patient.  Hep C Screening: 03/31/12 STD testing and prevention (HIV/chl/gon/syphilis): N/A Intimate partner violence: negative Sexual History :occasionally with  husband, denies pain  Menstrual History/LMP/Abnormal Bleeding: discussed post-menopausal bleeding Incontinence Symptoms: no problems   Breast cancer:  - Last Mammogram: 07/10/20 - BRCA gene screening: N/A  Osteoporosis: Discussed high calcium and vitamin D supplementation, weight bearing exercises  Cervical cancer screening: N/A  Skin cancer: Discussed monitoring for atypical lesions  Colorectal cancer: 06/11/17   Lung cancer: Low Dose CT Chest recommended if Age 76-80 years, 5 pack-year currently smoking OR have quit w/in 15years. Patient does not qualify.   ECG: 10/05/15  Advanced Care Planning: A voluntary discussion about advance care planning including the explanation and discussion of advance directives.  Discussed health care proxy and Living will, and the patient was able to identify a health care proxy as husband    Lipids: Lab Results  Component Value Date   CHOL 124 11/24/2020   CHOL 155 10/18/2019   CHOL 151 11/12/2018   Lab Results  Component Value Date   HDL 53 11/24/2020   HDL 43 (L) 10/18/2019   HDL 48 (L) 11/12/2018   Lab Results  Component Value Date   LDLCALC 48 11/24/2020   [place] 85 10/18/2019   Downs 82 11/12/2018   Lab Results  Component Value Date   TRIG 142  11/24/2020   TRIG 177 (H) 10/18/2019   TRIG 118 11/12/2018   Lab Results  Component Value Date   CHOLHDL 2.3 11/24/2020   CHOLHDL 3.6 10/18/2019   CHOLHDL 3.1 11/12/2018   No results found for: LDLDIRECT  Glucose: Glucose, Bld  Date Value Ref Range Status  11/24/2020 84 65 - 99 mg/dL Final    Comment:    .            Fasting reference interval .   10/18/2019 82 65 - 99 mg/dL Final    Comment:    .            Fasting reference interval .   11/12/2018 74 65 - 99 mg/dL Final    Comment:    .            Fasting reference interval .     Patient Active Problem List   Diagnosis Date Noted  . OSA on CPAP 04/15/2019  . B12 deficiency 12/09/2018  . Dyslipidemia  11/08/2017  . Vitamin D deficiency 10/31/2016  . Osteopenia of left hip 10/31/2016  . 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  . BPV (benign positional vertigo) 04/17/2016  . RLS (restless legs syndrome) 04/17/2016  . GERD (gastroesophageal reflux disease) 03/11/2016  . History of hyperparathyroidism 03/11/2016  . History of gastric ulcer 03/11/2016  . Essential hypertension 04/24/2015  . Primary osteoarthritis involving multiple joints 04/24/2015  . Lumbago 04/24/2015  . Family history of colonic polyps     Past Surgical History:  Procedure Laterality Date  . CARPAL TUNNEL RELEASE Bilateral 2000  . CATARACT EXTRACTION W/PHACO Left 04/18/2020   Procedure: CATARACT EXTRACTION PHACO AND INTRAOCULAR LENS PLACEMENT (IOC) LEFT 4.78  00:32.8;  Surgeon: Birder Robson, MD;  Location: [facility];  Service: Ophthalmology;  Laterality: Left;  . CATARACT EXTRACTION W/PHACO Right 05/09/2020   Procedure: CATARACT EXTRACTION PHACO AND INTRAOCULAR LENS PLACEMENT (IOC) RIGHT 3.41 00:21.8;  Surgeon: Birder Robson, MD;  Location: [facility];  Service: Ophthalmology;  Laterality: Right;  . COLONOSCOPY  06/17/2012   A few two mm ulcers were found in the sigmoid colon, no bleeding present,bx taken-path reporet on the few tiny ulcers ,non specific  . COLONOSCOPY WITH PROPOFOL N/A 06/11/2017   Procedure: COLONOSCOPY WITH PROPOFOL;  Surgeon: Christene Lye, MD;  Location: [facility];  Service: Endoscopy;  Laterality: N/A;  . parathyroid surgery  2008  . 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 TUBAL LIGATION      Family History  Adopted: Yes  Problem Relation Age of Onset  . Kidney disease Mother   . Ovarian cancer Mother   . Hypertension Father   . Stroke Father   . Lupus Sister   . Breast cancer Maternal Grandmother   . Breast cancer Cousin        pat cousins    Social History   Socioeconomic History  . Marital status: Married     Spouse name: [place] Hanlon   . Number of children: 1  . Years of education: Not on file  . Highest education level: Some college, no degree  Occupational History  . Not on file  Tobacco Use  . Smoking status: Never Smoker  . Smokeless tobacco: Never Used  Vaping Use  . Vaping Use: Never used  Substance and Sexual Activity  . Alcohol use: Yes    Comment: wine  . Drug use: No  . Sexual activity: Yes    Partners: Male  Birth control/protection: Post-menopausal  Other Topics Concern  . Not on file  Social History Narrative   Married, retired, involved in church and has one grown son    Social Determinants of Radio broadcast assistant Strain: Low Risk   . Difficulty of Paying Living Expenses: Not hard at all  Food Insecurity: No Food Insecurity  . Worried About Charity fundraiser in the Last Year: Never true  . Ran Out of Food in the Last Year: Never true  Transportation Needs: No Transportation Needs  . Lack of Transportation (Medical): No  . Lack of Transportation (Non-Medical): No  Physical Activity: Inactive  . Days of Exercise per Week: 0 days  . Minutes of Exercise per Session: 0 min  Stress: No Stress Concern Present  . Feeling of Stress : Not at all  Social Connections: Socially Integrated  . Frequency of Communication with Friends and Family: More than three times a week  . Frequency of Social Gatherings with Friends and Family: More than three times a week  . Attends Religious Services: 1 to 4 times per year  . Active Member of Clubs or Organizations: Yes  . Attends Archivist Meetings: More than 4 times per year  . Marital Status: Married  Human resources officer Violence: Not At Risk  . Fear of Current or Ex-Partner: No  . Emotionally Abused: No  . Physically Abused: No  . Sexually Abused: No     Current Outpatient Medications:  .  acetaminophen (TYLENOL) 500 MG tablet, Take 1-2 tablets (500-1,000 mg total) by mouth every 8 (eight) hours as  needed., Disp: 100 tablet, Rfl: 2 .  albuterol (VENTOLIN HFA) 108 (90 Base) MCG/ACT inhaler, Inhale 2 puffs into the lungs every 6 (six) hours as needed for wheezing or shortness of breath., Disp: 1 each, Rfl: 0 .  amLODipine (NORVASC) 2.5 MG tablet, Take 1 tablet (2.5 mg total) by mouth daily., Disp: 90 tablet, Rfl: 1 .  Ascorbic Acid (VITAMIN C) 1000 MG tablet, Take 1,000 mg by mouth., Disp: , Rfl:  .  BREO ELLIPTA 200-25 MCG/INH AEPB, Take 1 puff by mouth daily., Disp: , Rfl:  .  Calcium-Vitamin D 600-200 MG-UNIT tablet, Take 1 tablet by mouth 2 (two) times daily. , Disp: , Rfl:  .  Cholecalciferol (VITAMIN D3) 1000 units CAPS, Take by mouth., Disp: , Rfl:  .  Cod Liver Oil 1000 MG CAPS, Take by mouth., Disp: , Rfl:  .  Coenzyme Q10 (COQ10) 200 MG CAPS, Take 200 mg by mouth daily., Disp: , Rfl:  .  Cyanocobalamin (B-12) 2000 MCG TABS, Take 1 tablet by mouth daily., Disp: , Rfl:  .  fexofenadine (ALLEGRA) 180 MG tablet, 1 tablet, Disp: , Rfl:  .  Garlic 9702 MG CAPS, Take 1,000 mg by mouth. , Disp: , Rfl:  .  L-Lysine 500 MG TABS, Take by mouth., Disp: , Rfl:  .  levocetirizine (XYZAL) 5 MG tablet, Take 1 tablet (5 mg total) by mouth every evening., Disp: 90 tablet, Rfl: 0 .  magnesium oxide (MAG-OX) 400 MG tablet, Take 500 mg by mouth daily. , Disp: , Rfl:  .  montelukast (SINGULAIR) 10 MG tablet, Take 1 tablet (10 mg total) by mouth at bedtime., Disp: 90 tablet, Rfl: 1 .  Nutritional Supplements (JOINT FORMULA PO), Take 2 tablets by mouth daily. glucosamine, Disp: , Rfl:  .  olmesartan-hydrochlorothiazide (BENICAR HCT) 20-12.5 MG tablet, Take 1 tablet by mouth once daily, Disp: 90 tablet, Rfl: 0 .  omeprazole (PRILOSEC) 40 MG capsule, Take 1 capsule (40 mg total) by mouth daily., Disp: 90 capsule, Rfl: 0 .  Potassium Gluconate 2.5 MEQ TABS, Take 1 tablet by mouth. Pt taking 1 tab 99 mg daily, Disp: , Rfl:  .  pravastatin (PRAVACHOL) 20 MG tablet, Take 1 tablet by mouth once daily, Disp: 90  tablet, Rfl: 0 .  rOPINIRole (REQUIP) 0.5 MG tablet, Take 1 tablet (0.5 mg total) by mouth at bedtime., Disp: 90 tablet, Rfl: 1 .  Zoster Vaccine Adjuvanted [facility]) injection, Inject 0.5 mLs into the muscle once for 1 dose., Disp: 0.5 mL, Rfl: 1  Current Facility-Administered Medications:  .  cyanocobalamin ((VITAMIN B-12)) injection 1,000 mcg, 1,000 mcg, Intramuscular, Q30 days, Steele Sizer, MD, 1,000 mcg at 12/25/20 7681  Allergies  Allergen Reactions  . Ace Inhibitors Cough  . Aspirin Nausea Only    Abdominal pain  . Crestor [Rosuvastatin]     Muscle pain      ROS  Constitutional: Negative for fever or weight change.  Respiratory: Negative for cough and shortness of breath.   Cardiovascular: Negative for chest pain or palpitations.  Gastrointestinal: Negative for abdominal pain, no bowel changes.  Musculoskeletal: Negative for gait problem or joint swelling.  Skin: Negative for rash.  Neurological: Negative for dizziness or headache.  No other specific complaints in a complete review of systems (except as listed in HPI above).  Objective  Vitals:   01/31/21 0838  BP: 124/78  Pulse: 74  Resp: 16  Temp: 98.2 F (36.8 C)  SpO2: 98%  Weight: 140 lb 3.2 oz (63.6 kg)  Height: 5' 3" (1.6 m)    Body mass index is 24.84 kg/m.  Physical Exam  Constitutional: Patient appears well-developed and well-nourished. No distress.  HENT: Head: Normocephalic and atraumatic. Ears: B TMs ok, no erythema or effusion; Nose: Nose normal. Mouth/Throat: Oropharynx is clear and moist. No oropharyngeal exudate.  Eyes: Conjunctivae and EOM are normal. Pupils are equal, round, and reactive to light. No scleral icterus.  Neck: Normal range of motion. Neck supple. No JVD present. No thyromegaly present.  Cardiovascular: Normal rate, regular rhythm and normal heart sounds.  No murmur heard. No BLE edema. Pulmonary/Chest: Effort normal and breath sounds normal. No respiratory  distress. Abdominal: Soft. Bowel sounds are normal, no distension. There is no tenderness. no masses Breast: no masses , no nipple discharge or rashes FEMALE GENITALIA:  External genitalia showed area of induration and tenderness on right labia majora, with a small area in the center that seems opened but not draining any material  External urethra normal Vaginal vault normal without discharge or lesions RECTAL: no rectal masses or hemorrhoids Musculoskeletal: Normal range of motion, no joint effusions. No gross deformities Neurological: he is alert and oriented to person, place, and time. No cranial nerve deficit. Coordination, balance, strength, speech and gait are normal.  Skin: Skin is warm and dry. No rash noted. No erythema.  Psychiatric: Patient has a normal mood and affect. behavior is normal. Judgment and thought content normal.  Recent Results (from the past 2160 hour(s))  Vitamin B12     Status: None   Collection Time: 11/24/20  9:39 AM  Result Value Ref Range   Vitamin B-12 1,039 200 - 1,100 pg/mL  VITAMIN D 25 Hydroxy (Vit-D Deficiency, Fractures)     Status: None   Collection Time: 11/24/20  9:39 AM  Result Value Ref Range   Vit D, 25-Hydroxy 44 30 - 100 ng/mL  Comment: Vitamin D Status         25-OH Vitamin D: . Deficiency:                    <20 ng/mL Insufficiency:             20 - 29 ng/mL Optimal:                 > or = 30 ng/mL . For 25-OH Vitamin D testing on patients on  D2-supplementation and patients for whom quantitation  of D2 and D3 fractions is required, the [facility]ure[facility](TM) 25-OH VIT D, (D2,D3), LC/MS/MS is recommended: order  code [phone] (patients >41yr). See Note 1 . Note 1 . For additional information, please refer to  http://education.[facility].com/faq/FAQ199  (This link is being provided for informational/ educational purposes only.)   COMPLETE METABOLIC PANEL WITH GFR     Status: None   Collection Time: 11/24/20  9:39 AM  Result  Value Ref Range   Glucose, Bld 84 65 - 99 mg/dL    Comment: .            Fasting reference interval .    BUN 13 7 - 25 mg/dL   Creat 0.84 0.60 - 0.93 mg/dL    Comment: For patients >464years of age, the reference limit for Creatinine is approximately 13% higher for people identified as African-American. .    GFR, Est Non African American 68 > OR = 60 mL/min/1.763m  GFR, Est African American 79 > OR = 60 mL/min/1.7388m BUN/Creatinine Ratio NOT APPLICABLE 6 - 22 (calc)   Sodium 142 135 - 146 mmol/L   Potassium 3.8 3.5 - 5.3 mmol/L   Chloride 105 98 - 110 mmol/L   CO2 27 20 - 32 mmol/L   Calcium 10.1 8.6 - 10.4 mg/dL   Total Protein 7.0 6.1 - 8.1 g/dL   Albumin 4.4 3.6 - 5.1 g/dL   Globulin 2.6 1.9 - 3.7 g/dL (calc)   AG Ratio 1.7 1.0 - 2.5 (calc)   Total Bilirubin 0.4 0.2 - 1.2 mg/dL   Alkaline phosphatase (APISO) 48 37 - 153 U/L   AST 20 10 - 35 U/L   ALT 15 6 - 29 U/L  CBC with Differential/Platelet     Status: None   Collection Time: 11/24/20  9:39 AM  Result Value Ref Range   WBC 7.4 3.8 - 10.8 Thousand/uL   RBC 4.25 3.80 - 5.10 Million/uL   Hemoglobin 13.6 11.7 - 15.5 g/dL   HCT 39.5 35.0 - 45.0 %   MCV 92.9 80.0 - 100.0 fL   MCH 32.0 27.0 - 33.0 pg   MCHC 34.4 32.0 - 36.0 g/dL   RDW 11.7 11.0 - 15.0 %   Platelets 266 140 - 400 Thousand/uL   MPV 9.5 7.5 - 12.5 fL   Neutro Abs 4,159 1,500 - 7,800 cells/uL   Lymphs Abs 2,449 850 - 3,900 cells/uL   Absolute Monocytes 696 200 - 950 cells/uL   Eosinophils Absolute 59 15 - 500 cells/uL   Basophils Absolute 37 0 - 200 cells/uL   Neutrophils Relative % 56.2 %   Total Lymphocyte 33.1 %   Monocytes Relative 9.4 %   Eosinophils Relative 0.8 %   Basophils Relative 0.5 %  Lipid panel     Status: None   Collection Time: 11/24/20  9:39 AM  Result Value Ref Range   Cholesterol 124 <200 mg/dL   HDL 53 > OR =  50 mg/dL   Triglycerides 142 <150 mg/dL   LDL Cholesterol (Calc) 48 mg/dL (calc)    Comment: Reference range:  <100 . Desirable range <100 mg/dL for primary prevention;   <70 mg/dL for patients with CHD or diabetic patients  with > or = 2 CHD risk factors. Marland Kitchen LDL-C is now calculated using the Martin-Hopkins  calculation, which is a validated novel method providing  better accuracy than the Friedewald equation in the  estimation of LDL-C.  Cresenciano Genre et al. Annamaria Helling. 7106;269(48): 2061-2068  (http://education.[facility].com/faq/FAQ164)    Total CHOL/HDL Ratio 2.3 <5.0 (calc)   Non-HDL Cholesterol (Calc) 71 <130 mg/dL (calc)    Comment: For patients with diabetes plus 1 major ASCVD risk  factor, treating to a non-HDL-C goal of <100 mg/dL  (LDL-C of <70 mg/dL) is considered a therapeutic  option.      Fall Risk: Fall Risk  01/31/2021 11/30/2020 10/26/2020 04/24/2020 11/25/2019  Falls in the past year? 0 0 0 0 0  Number falls in past yr: 0 0 0 0 0  Injury with Fall? 0 0 0 0 0  Risk for fall due to : - No Fall Risks - - No Fall Risks  Follow up - Falls prevention discussed - - Falls prevention discussed     Functional Status Survey: Is the patient deaf or have difficulty hearing?: No Does the patient have difficulty seeing, even when wearing glasses/contacts?: No Does the patient have difficulty concentrating, remembering, or making decisions?: No Does the patient have difficulty walking or climbing stairs?: No Does the patient have difficulty dressing or bathing?: No Does the patient have difficulty doing errands alone such as visiting a doctor's office or shopping?: No   Assessment & Plan  1. Well adult exam   2. Breast cancer screening by mammogram  - MM 3D SCREEN BREAST BILATERAL; Future  3. B12 deficiency  - cyanocobalamin ((VITAMIN B-12)) injection 1,000 mcg  4. Need for shingles vaccine  - Zoster Vaccine Adjuvanted [facility]) injection; Inject 0.5 mLs into the muscle once for 1 dose.  Dispense: 0.5 mL; Refill: 1  5. Abscess of right genital labia  -  sulfamethoxazole-trimethoprim (BACTRIM DS) 800-160 MG tablet; Take 1 tablet by mouth 2 (two) times daily.  Dispense: 14 tablet; Refill: 0 - metroNIDAZOLE (FLAGYL) 500 MG tablet; Take 1 tablet (500 mg total) by mouth 3 (three) times daily for 7 days.  Dispense: 21 tablet; Refill: 0  -USPSTF grade A and B recommendations reviewed with patient; age-appropriate recommendations, preventive care, screening tests, etc discussed and encouraged; healthy living encouraged; see AVS for patient education given to patient -Discussed importance of 150 minutes of physical activity weekly, eat two servings of fish weekly, eat one serving of tree nuts ( cashews, pistachios, pecans, almonds.Marland Kitchen) every other day, eat 6 servings of fruit/vegetables daily and drink plenty of water and avoid sweet beverages.

## 2021-01-31 NOTE — Patient Instructions (Addendum)
Please get B12 sub-lingual otc, ask pharmacist   Preventive Care 74 Years and Older, Female Preventive care refers to lifestyle choices and visits with your health care provider that can promote health and wellness. This includes:  A yearly physical exam. This is also called an annual wellness visit.  Regular dental and eye exams.  Immunizations.  Screening for certain conditions.  Healthy lifestyle choices, such as: ? Eating a healthy diet. ? Getting regular exercise. ? Not using drugs or products that contain nicotine and tobacco. ? Limiting alcohol use. What can I expect for my preventive care visit? Physical exam Your health care provider will check your:  Height and weight. These may be used to calculate your BMI (body mass index). BMI is a measurement that tells if you are at a healthy weight.  Heart rate and blood pressure.  Body temperature.  Skin for abnormal spots. Counseling Your health care provider may ask you questions about your:  Past medical problems.  Family's medical history.  Alcohol, tobacco, and drug use.  Emotional well-being.  Home life and relationship well-being.  Sexual activity.  Diet, exercise, and sleep habits.  History of falls.  Memory and ability to understand (cognition).  Work and work Statistician.  Pregnancy and menstrual history.  Access to firearms. What immunizations do I need? Vaccines are usually given at various ages, according to a schedule. Your health care provider will recommend vaccines for you based on your age, medical history, and lifestyle or other factors, such as travel or where you work.   What tests do I need? Blood tests  Lipid and cholesterol levels. These may be checked every 5 years, or more often depending on your overall health.  Hepatitis C test.  Hepatitis B test. Screening  Lung cancer screening. You may have this screening every year starting at age 80 if you have a 30-pack-year history  of smoking and currently smoke or have quit within the past 15 years.  Colorectal cancer screening. ? All adults should have this screening starting at age 36 and continuing until age 94. ? Your health care provider may recommend screening at age 66 if you are at increased risk. ? You will have tests every 1-10 years, depending on your results and the type of screening test.  Diabetes screening. ? This is done by checking your blood sugar (glucose) after you have not eaten for a while (fasting). ? You may have this done every 1-3 years.  Mammogram. ? This may be done every 1-2 years. ? Talk with your health care provider about how often you should have regular mammograms.  Abdominal aortic aneurysm (AAA) screening. You may need this if you are a current or former smoker.  BRCA-related cancer screening. This may be done if you have a family history of breast, ovarian, tubal, or peritoneal cancers. Other tests  STD (sexually transmitted disease) testing, if you are at risk.  Bone density scan. This is done to screen for osteoporosis. You may have this done starting at age 90. Talk with your health care provider about your test results, treatment options, and if necessary, the need for more tests. Follow these instructions at home: Eating and drinking  Eat a diet that includes fresh fruits and vegetables, whole grains, lean protein, and low-fat dairy products. Limit your intake of foods with high amounts of sugar, saturated fats, and salt.  Take vitamin and mineral supplements as recommended by your health care provider.  Do not drink alcohol if your  health care provider tells you not to drink.  If you drink alcohol: ? Limit how much you have to 0-1 drink a day. ? Be aware of how much alcohol is in your drink. In the [place] one drink equals one 12 oz bottle of beer (355 mL), one 5 oz glass of wine (148 mL), or one 1 oz glass of hard liquor (44 mL).   Lifestyle  Take daily care of  your teeth and gums. Brush your teeth every morning and night with fluoride toothpaste. Floss one time each day.  Stay active. Exercise for at least 30 minutes 5 or more days each week.  Do not use any products that contain nicotine or tobacco, such as cigarettes, e-cigarettes, and chewing tobacco. If you need help quitting, ask your health care provider.  Do not use drugs.  If you are sexually active, practice safe sex. Use a condom or other form of protection in order to prevent STIs (sexually transmitted infections).  Talk with your health care provider about taking a low-dose aspirin or statin.  Find healthy ways to cope with stress, such as: ? Meditation, yoga, or listening to music. ? Journaling. ? Talking to a trusted person. ? Spending time with friends and family. Safety  Always wear your seat belt while driving or riding in a vehicle.  Do not drive: ? If you have been drinking alcohol. Do not ride with someone who has been drinking. ? When you are tired or distracted. ? While texting.  Wear a helmet and other protective equipment during sports activities.  If you have firearms in your house, make sure you follow all gun safety procedures. What's next?  Visit your health care provider once a year for an annual wellness visit.  Ask your health care provider how often you should have your eyes and teeth checked.  Stay up to date on all vaccines. This information is not intended to replace advice given to you by your health care provider. Make sure you discuss any questions you have with your health care provider. Document Revised: 08/30/2020 Document Reviewed: 09/03/2018 Elsevier Patient Education  [place] 74 Years and Older, Female Preventive care refers to lifestyle choices and visits with your health care provider that can promote health and wellness. This includes:  A yearly physical exam. This is also called an annual wellness  visit.  Regular dental and eye exams.  Immunizations.  Screening for certain conditions.  Healthy lifestyle choices, such as: ? Eating a healthy diet. ? Getting regular exercise. ? Not using drugs or products that contain nicotine and tobacco. ? Limiting alcohol use. What can I expect for my preventive care visit? Physical exam Your health care provider will check your:  Height and weight. These may be used to calculate your BMI (body mass index). BMI is a measurement that tells if you are at a healthy weight.  Heart rate and blood pressure.  Body temperature.  Skin for abnormal spots. Counseling Your health care provider may ask you questions about your:  Past medical problems.  Family's medical history.  Alcohol, tobacco, and drug use.  Emotional well-being.  Home life and relationship well-being.  Sexual activity.  Diet, exercise, and sleep habits.  History of falls.  Memory and ability to understand (cognition).  Work and work Statistician.  Pregnancy and menstrual history.  Access to firearms. What immunizations do I need? Vaccines are usually given at various ages, according to a schedule. Your health care  provider will recommend vaccines for you based on your age, medical history, and lifestyle or other factors, such as travel or where you work.   What tests do I need? Blood tests  Lipid and cholesterol levels. These may be checked every 5 years, or more often depending on your overall health.  Hepatitis C test.  Hepatitis B test. Screening  Lung cancer screening. You may have this screening every year starting at age 74 if you have a 30-pack-year history of smoking and currently smoke or have quit within the past 15 years.  Colorectal cancer screening. ? All adults should have this screening starting at age 66 and continuing until age 36. ? Your health care provider may recommend screening at age 10 if you are at increased risk. ? You will  have tests every 1-10 years, depending on your results and the type of screening test.  Diabetes screening. ? This is done by checking your blood sugar (glucose) after you have not eaten for a while (fasting). ? You may have this done every 1-3 years.  Mammogram. ? This may be done every 1-2 years. ? Talk with your health care provider about how often you should have regular mammograms.  Abdominal aortic aneurysm (AAA) screening. You may need this if you are a current or former smoker.  BRCA-related cancer screening. This may be done if you have a family history of breast, ovarian, tubal, or peritoneal cancers. Other tests  STD (sexually transmitted disease) testing, if you are at risk.  Bone density scan. This is done to screen for osteoporosis. You may have this done starting at age 43. Talk with your health care provider about your test results, treatment options, and if necessary, the need for more tests. Follow these instructions at home: Eating and drinking  Eat a diet that includes fresh fruits and vegetables, whole grains, lean protein, and low-fat dairy products. Limit your intake of foods with high amounts of sugar, saturated fats, and salt.  Take vitamin and mineral supplements as recommended by your health care provider.  Do not drink alcohol if your health care provider tells you not to drink.  If you drink alcohol: ? Limit how much you have to 0-1 drink a day. ? Be aware of how much alcohol is in your drink. In the [place] one drink equals one 12 oz bottle of beer (355 mL), one 5 oz glass of wine (148 mL), or one 1 oz glass of hard liquor (44 mL).   Lifestyle  Take daily care of your teeth and gums. Brush your teeth every morning and night with fluoride toothpaste. Floss one time each day.  Stay active. Exercise for at least 30 minutes 5 or more days each week.  Do not use any products that contain nicotine or tobacco, such as cigarettes, e-cigarettes, and chewing  tobacco. If you need help quitting, ask your health care provider.  Do not use drugs.  If you are sexually active, practice safe sex. Use a condom or other form of protection in order to prevent STIs (sexually transmitted infections).  Talk with your health care provider about taking a low-dose aspirin or statin.  Find healthy ways to cope with stress, such as: ? Meditation, yoga, or listening to music. ? Journaling. ? Talking to a trusted person. ? Spending time with friends and family. Safety  Always wear your seat belt while driving or riding in a vehicle.  Do not drive: ? If you have been drinking alcohol. Do  not ride with someone who has been drinking. ? When you are tired or distracted. ? While texting.  Wear a helmet and other protective equipment during sports activities.  If you have firearms in your house, make sure you follow all gun safety procedures. What's next?  Visit your health care provider once a year for an annual wellness visit.  Ask your health care provider how often you should have your eyes and teeth checked.  Stay up to date on all vaccines. This information is not intended to replace advice given to you by your health care provider. Make sure you discuss any questions you have with your health care provider. Document Revised: 08/30/2020 Document Reviewed: 09/03/2018 Elsevier Patient Education  2021 [employer].

## 2021-02-19 ENCOUNTER — Other Ambulatory Visit: Payer: Self-pay | Admitting: Family Medicine

## 2021-02-19 DIAGNOSIS — K219 Gastro-esophageal reflux disease without esophagitis: Secondary | ICD-10-CM

## 2021-02-26 ENCOUNTER — Telehealth: Payer: Self-pay | Admitting: Family Medicine

## 2021-02-26 ENCOUNTER — Other Ambulatory Visit: Payer: Self-pay | Admitting: Family Medicine

## 2021-02-26 DIAGNOSIS — K219 Gastro-esophageal reflux disease without esophagitis: Secondary | ICD-10-CM

## 2021-02-26 NOTE — Telephone Encounter (Signed)
Pt called stating that she lost her pill bottle for omeprazole. She states that she is needing to have a refill. Pt also has some concerns regarding her B12 shot. She states that she has been taking b12 supplement under the tongue as advised. She is requesting to know if she still needs to schedule a shot. Please advise.      [facility] [address], [place] - [address]  [address] [place] [address]  Phone: [phone] Fax: [phone]  Hours: Not open 24 hours

## 2021-02-26 NOTE — Telephone Encounter (Signed)
Is she eligable to get shot?

## 2021-02-26 NOTE — Telephone Encounter (Signed)
Pt stated she found her bottle of pills but still had some follow up questions and wanted a call back regarding her B12 shots.

## 2021-02-27 ENCOUNTER — Telehealth: Payer: Self-pay

## 2021-02-27 NOTE — Telephone Encounter (Signed)
Pt.notified

## 2021-02-28 NOTE — Telephone Encounter (Signed)
erro  neous encounter

## 2021-03-27 ENCOUNTER — Other Ambulatory Visit: Payer: Self-pay | Admitting: Family Medicine

## 2021-03-27 DIAGNOSIS — G2581 Restless legs syndrome: Secondary | ICD-10-CM

## 2021-03-27 DIAGNOSIS — I10 Essential (primary) hypertension: Secondary | ICD-10-CM

## 2021-03-27 NOTE — Telephone Encounter (Signed)
Last seen 5.11.2022  Upcoming 7.20.2022

## 2021-03-27 NOTE — Telephone Encounter (Signed)
}  Notes to clinic:  script has expired  Review for continued use    Requested Prescriptions  Pending Prescriptions Disp Refills   rOPINIRole (REQUIP) 0.5 MG tablet [Pharmacy Med Name: rOPINIRole HCl 0.5 MG Oral Tablet] 90 tablet 0    Sig: TAKE 1 TABLET BY MOUTH AT BEDTIME      Neurology:  Parkinsonian Agents Passed - 03/27/2021  9:34 AM      Passed - Last BP in normal range    BP Readings from Last 1 Encounters:  01/31/21 124/78          Passed - Valid encounter within last 12 months    Recent Outpatient Visits           1 month ago Well adult exam   [facility] Cory, MD   5 months ago Essential hypertension   [facility] Alba Cory, MD   11 months ago Dyslipidemia   [facility] Alba Cory, MD   1 year ago Dyslipidemia   [facility], Danna Hefty, MD   1 year ago Essential hypertension   [facility] Alba Cory, MD       Future Appointments             In 1 month Alba Cory, MD [facility]   In 8 months  [facility]              Signed Prescriptions Disp Refills   pravastatin (PRAVACHOL) 20 MG tablet 90 tablet 0    Sig: Take 1 tablet by mouth once daily      Cardiovascular:  Antilipid - Statins Passed - 03/27/2021  9:34 AM      Passed - Total Cholesterol in normal range and within 360 days    Cholesterol, Total  Date Value Ref Range Status  06/06/2015 165 100 - 199 mg/dL Final   Cholesterol  Date Value Ref Range Status  11/24/2020 124 <200 mg/dL Final          Passed - LDL in normal range and within 360 days    LDL Cholesterol (Calc)  Date Value Ref Range Status  11/24/2020 48 mg/dL (calc) Final    Comment:    Reference range: <100 . Desirable range <100 mg/dL for primary prevention;   <70 mg/dL for patients with CHD or diabetic patients  with > or = 2 CHD risk  factors. Marland Kitchen LDL-C is now calculated using the Martin-Hopkins  calculation, which is a validated novel method providing  better accuracy than the Friedewald equation in the  estimation of LDL-C.  Horald Pollen et al. Lenox Ahr. 7253;664(40): 2061-2068  (http://education.[employer].com/faq/FAQ164)           Passed - HDL in normal range and within 360 days    HDL  Date Value Ref Range Status  11/24/2020 53 > OR = 50 mg/dL Final  34/74/2595 48 >63 mg/dL Final    Comment:    According to ATP-III Guidelines, HDL-C >59 mg/dL is considered a negative risk factor for CHD.           Passed - Triglycerides in normal range and within 360 days    Triglycerides  Date Value Ref Range Status  11/24/2020 142 <150 mg/dL Final          Passed - Patient is not pregnant      Passed - Valid encounter within last 12 months    Recent  Outpatient Visits           1 month ago Well adult exam   [facility] Alba Cory, MD   5 months ago Essential hypertension   [facility], Danna Hefty, MD   11 months ago Dyslipidemia   [facility], [facility]   1 year ago Dyslipidemia   [facility] Alba Cory, MD   1 year ago Essential hypertension   [facility] Alba Cory, MD       Future Appointments             In 1 month Alba Cory, MD [facility]   In 8 months  [facility]               olmesartan-hydrochlorothiazide (BENICAR HCT) 20-12.5 MG tablet 90 tablet 0    Sig: Take 1 tablet by mouth once daily      Cardiovascular: ARB + Diuretic Combos Passed - 03/27/2021  9:34 AM      Passed - K in normal range and within 180 days    Potassium  Date Value Ref Range Status  11/24/2020 3.8 3.5 - 5.3 mmol/L Final          Passed - Na in normal range and within 180 days    Sodium  Date Value Ref Range Status  11/24/2020  142 135 - 146 mmol/L Final  06/06/2015 141 134 - 144 mmol/L Final          Passed - Cr in normal range and within 180 days    Creat  Date Value Ref Range Status  11/24/2020 0.84 0.60 - 0.93 mg/dL Final    Comment:    For patients >45 years of age, the reference limit for Creatinine is approximately 13% higher for people identified as African-American. .           Passed - Ca in normal range and within 180 days    Calcium  Date Value Ref Range Status  11/24/2020 10.1 8.6 - 10.4 mg/dL Final          Passed - Patient is not pregnant      Passed - Last BP in normal range    BP Readings from Last 1 Encounters:  01/31/21 124/78          Passed - Valid encounter within last 6 months    Recent Outpatient Visits           1 month ago Well adult exam   [facility] Alba Cory, MD   5 months ago Essential hypertension   [facility] Alba Cory, MD   11 months ago Dyslipidemia   [facility] Alba Cory, MD   1 year ago Dyslipidemia   [facility] Alba [facility], MD   1 year ago Essential hypertension   [facility] Alba Cory, MD       Future Appointments             In 1 month Carlynn Purl, Danna Hefty, MD [facility]   In 8 months  [facility]

## 2021-04-07 ENCOUNTER — Other Ambulatory Visit: Payer: Self-pay | Admitting: Family Medicine

## 2021-04-07 DIAGNOSIS — G2581 Restless legs syndrome: Secondary | ICD-10-CM

## 2021-04-07 NOTE — Telephone Encounter (Signed)
Requested Prescriptions  Pending Prescriptions Disp Refills  . rOPINIRole (REQUIP) 0.5 MG tablet [Pharmacy Med Name: rOPINIRole HCl 0.5 MG Oral Tablet] 90 tablet 0    Sig: TAKE 1 TABLET BY MOUTH AT BEDTIME     Neurology:  Parkinsonian Agents Passed - 04/07/2021  1:27 PM      Passed - Last BP in normal range    BP Readings from Last 1 Encounters:  01/31/21 124/78         Passed - Valid encounter within last 12 months    Recent Outpatient Visits          2 months ago Well adult exam   [facility] Alba Cory, MD   5 months ago Essential hypertension   [facility] Alba Cory, MD   11 months ago Dyslipidemia   [facility] Alba Cory, MD   1 year ago Dyslipidemia   [facility] Alba Cory, MD   1 year ago Essential hypertension   [facility] Alba Cory, MD      Future Appointments            In 2 weeks Alba Cory, MD [facility]   In 8 months  [facility]

## 2021-04-10 ENCOUNTER — Telehealth: Payer: Self-pay

## 2021-04-10 NOTE — Progress Notes (Signed)
Spoke to patient to reschedule patient telephone appointment on 04/11/2021 for CCM at 10:00 am with Angelena Sole the Clinical pharmacist to 04/12/2021 at 4:00 pm.  Patient Verbalized agreement to the changing her appointment.Patient aware to have all medications, supplements, blood pressure  logs to visit.  Questions: Have you had any recent office visit or specialist visit outside of [facility]?no Are there any concerns you would like to discuss during your office visit?  Patient states she is concern she may need to restart her B-12 injections.Patient states she has been feeling tried and sluggish. Patient reports her provider stop the B-12 injection a few months ago.Patient states she increase her B-12 tablets to 100 mg daily with no improvement. Are you having any problems obtaining your medications? No If patient has any PAP medications ask if they are having any problems getting their PAP medication or refill?NO  Care Gaps: Shingrix COVID-19 Vaccine Star Rating Drug: Pravastatin 20 mg last filled on 03/27/2021 for 90 day supply at [facility].  Any gaps in medications fill history? N/A  Everlean Cherry Clinical Pharmacist Assistant [phone]

## 2021-04-11 ENCOUNTER — Telehealth: Payer: Self-pay

## 2021-04-11 NOTE — Progress Notes (Signed)
    Chronic Care Management Pharmacy Assistant   Name: Ruth [name]  MRN: [id number] DOB: 1946-04-08  Please void this note as it was opened in error

## 2021-04-12 ENCOUNTER — Ambulatory Visit (INDEPENDENT_AMBULATORY_CARE_PROVIDER_SITE_OTHER): Payer: Medicare HMO

## 2021-04-12 DIAGNOSIS — I10 Essential (primary) hypertension: Secondary | ICD-10-CM

## 2021-04-12 DIAGNOSIS — E785 Hyperlipidemia, unspecified: Secondary | ICD-10-CM | POA: Diagnosis not present

## 2021-04-12 DIAGNOSIS — J45998 Other asthma: Secondary | ICD-10-CM

## 2021-04-12 NOTE — Progress Notes (Signed)
Chronic Care Management Pharmacy Note  04/13/2021 Name:  Ruth [name] MRN:  [id number] DOB:  10-16-1945  Summary: Patient presents today for CCM follow-up. She reports doing well overall, but has noticed she has had lower energy, napping more often since stopping her Vitamin B12 injections. She does continue to take SL vitamin B12 supplementation.   She is concerned about worsening trigger finger in her right hand.  Recommendations/Changes made from today's visit: Recommend rechecking vitamin B12   Plan: CPP follow-up in 5 months    Subjective: Ruth [name] is an 75 y.o. year old female who is a primary patient of Steele Sizer, MD.  The CCM team was consulted for assistance with disease management and care coordination needs.    Engaged with patient by telephone for follow up visit in response to provider referral for pharmacy case management and/or care coordination services.   Consent to Services:  The patient was given information about Chronic Care Management services, agreed to services, and gave verbal consent prior to initiation of services.  Please see initial visit note for detailed documentation.   Patient Care Team: Steele Sizer, MD as PCP - General (Family Medicine) Mosetta Anis, MD as Referring Physician (Allergy) Vladimir Crofts, MD as Consulting Physician (Neurology) Germaine Pomfret, [facility] as Pharmacist (Pharmacist)  Recent office visits: 01/31/21: Patient presentes to Dr. Ancil Boozer for follow-up. Metronidazole + Bactrim for abscess.  11/15/20: Patient presents to Dr. Ancil Boozer for follow-up.   Recent consult visits: None in previous 6 months  Hospital visits: None in previous 6 months   Objective:  Lab Results  Component Value Date   CREATININE 0.84 11/24/2020   BUN 13 11/24/2020   GFRNONAA 68 11/24/2020   GFRAA 79 11/24/2020   NA 142 11/24/2020   K 3.8 11/24/2020   CALCIUM 10.1 11/24/2020   CO2 27 11/24/2020   GLUCOSE 84 11/24/2020     Lab Results  Component Value Date/Time   HGBA1C 5.3 10/31/2016 09:26 AM    Last diabetic Eye exam: No results found for: HMDIABEYEEXA  Last diabetic Foot exam: No results found for: HMDIABFOOTEX   Lab Results  Component Value Date   CHOL 124 11/24/2020   HDL 53 11/24/2020   LDLCALC 48 11/24/2020   TRIG 142 11/24/2020   CHOLHDL 2.3 11/24/2020    Hepatic Function Latest Ref Rng & Units 11/24/2020 10/18/2019 11/12/2018  Total Protein 6.1 - 8.1 g/dL 7.0 7.1 7.0  Albumin 3.6 - 5.1 g/dL - - -  AST 10 - 35 U/L $Remo'20 22 23  'mClMP$ ALT 6 - 29 U/L $Remo'15 21 17  'lumkD$ Alk Phosphatase 33 - 130 U/L - - -  Total Bilirubin 0.2 - 1.2 mg/dL 0.4 0.4 0.5    Lab Results  Component Value Date/Time   TSH 0.94 11/05/2017 09:49 AM   TSH 0.837 06/06/2015 08:34 AM    CBC Latest Ref Rng & Units 11/24/2020 10/18/2019 11/12/2018  WBC 3.8 - 10.8 Thousand/uL 7.4 7.7 8.5  Hemoglobin 11.7 - 15.5 g/dL 13.6 13.7 14.1  Hematocrit 35.0 - 45.0 % 39.5 40.3 39.9  Platelets 140 - 400 Thousand/uL 266 281 334    Lab Results  Component Value Date/Time   VD25OH 44 11/24/2020 09:39 AM   VD25OH 38 10/18/2019 10:27 AM    Clinical ASCVD: No  The ASCVD Risk score Mikey Bussing DC Jr., et al., 2013) failed to calculate for the following reasons:   The valid total cholesterol range is 130 to 320 mg/dL    Depression  screen [facility] 2/9 01/31/2021 11/30/2020 10/26/2020  Decreased Interest 0 0 0  Down, Depressed, Hopeless 0 0 0  PHQ - 2 Score 0 0 0  Altered sleeping 0 - -  Tired, decreased energy 0 - -  Change in appetite 0 - -  Feeling bad or failure about yourself  0 - -  Trouble concentrating 0 - -  Moving slowly or fidgety/restless 0 - -  Suicidal thoughts 0 - -  PHQ-9 Score 0 - -  Difficult doing work/chores Not difficult at all - -    Social History   Tobacco Use  Smoking Status Never  Smokeless Tobacco Never   BP Readings from Last 3 Encounters:  01/31/21 124/78  10/26/20 132/84  05/09/20 (!) 126/59   Pulse Readings from Last 3  Encounters:  01/31/21 74  10/26/20 90  05/09/20 64   Wt Readings from Last 3 Encounters:  01/31/21 140 lb 3.2 oz (63.6 kg)  10/26/20 139 lb 1.6 oz (63.1 kg)  05/09/20 141 lb (64 kg)   BMI Readings from Last 3 Encounters:  01/31/21 24.84 kg/m  10/26/20 24.64 kg/m  05/09/20 27.54 kg/m    Assessment/Interventions: Review of patient past medical history, allergies, medications, health status, including review of consultants reports, laboratory and other test data, was performed as part of comprehensive evaluation and provision of chronic care management services.   SDOH:  (Social Determinants of Health) assessments and interventions performed: Yes SDOH Interventions    Flowsheet Row Most Recent Value  SDOH Interventions   Financial Strain Interventions Intervention Not Indicated      SDOH Screenings   Alcohol Screen: Low Risk    Last Alcohol Screening Score (AUDIT): 0  Depression (PHQ2-9): Low Risk    PHQ-2 Score: 0  Financial Resource Strain: Low Risk    Difficulty of Paying Living Expenses: Not hard at all  Food Insecurity: No Food Insecurity   Worried About Charity fundraiser in the Last Year: Never true   Ran Out of Food in the Last Year: Never true  Housing: Low Risk    Last Housing Risk Score: 0  Physical Activity: Inactive   Days of Exercise per Week: 0 days   Minutes of Exercise per Session: 0 min  Social Connections: Engineer, building services of Communication with Friends and Family: More than three times a week   Frequency of Social Gatherings with Friends and Family: More than three times a week   Attends Religious Services: 1 to 4 times per year   Active Member of [employer] or Organizations: Yes   Attends Music therapist: More than 4 times per year   Marital Status: Married  Stress: No Stress Concern Present   Feeling of Stress : Not at all  Tobacco Use: Low Risk    Smoking Tobacco Use: Never   Smokeless Tobacco Use: Never   Transportation Needs: No Transportation Needs   Lack of Transportation (Medical): No   Lack of Transportation (Non-Medical): No    CCM Care Plan  Allergies  Allergen Reactions   Ace Inhibitors Cough   Aspirin Nausea Only    Abdominal pain   Crestor [Rosuvastatin]     Muscle pain     Medications Reviewed Today     Reviewed by Steele Sizer, MD (Physician) on 01/31/21 at [place] List Status: <None>   Medication Order Taking? Sig Documenting Provider Last Dose Status Informant  acetaminophen (TYLENOL) 500 MG tablet [id number] Yes Take 1-2 tablets (500-1,000  mg total) by mouth every 8 (eight) hours as needed. Steele Sizer, MD Taking Active   albuterol (VENTOLIN HFA) 108 (90 Base) MCG/ACT inhaler [id number] Yes Inhale 2 puffs into the lungs every 6 (six) hours as needed for wheezing or shortness of breath. Steele Sizer, MD Taking Active   amLODipine (NORVASC) 2.5 MG tablet [id number] Yes Take 1 tablet (2.5 mg total) by mouth daily. Steele Sizer, MD Taking Active   Ascorbic Acid (VITAMIN C) 1000 MG tablet [id number] Yes Take 1,000 mg by mouth. [provider] Taking Active            Med Note Clemetine Marker D   Thu Nov 30, 2020  9:09 AM)    [place] Patter 200-25 MCG/INH AEPB [id number] Yes Take 1 puff by mouth daily. Mosetta Anis, MD Taking Active   Calcium-Vitamin D 600-200 MG-UNIT tablet [id number] Yes Take 1 tablet by mouth 2 (two) times daily.  [provider] Taking Active Self           Med Note Gloris Ham, [place] [employer] D   Thu Nov 30, 2020  9:06 AM)    Cholecalciferol (VITAMIN D3) 1000 units CAPS [id number] Yes Take by mouth. [provider] Taking Active            Med Note Clemetine Marker D   Thu Nov 30, 2020  9:10 AM)    Cod Liver Oil 1000 MG CAPS [id number] Yes Take by mouth. [provider] Taking Active   Coenzyme Q10 (COQ10) 200 MG CAPS [id number] Yes Take 200 mg by mouth daily. [provider] Taking Active   cyanocobalamin ((VITAMIN  B-12)) injection 1,000 mcg [id number]   Steele Sizer, MD  Active   Cyanocobalamin (B-12) 2000 MCG TABS [id number] Yes Take 1 tablet by mouth daily. [provider] Taking Active   fexofenadine (ALLEGRA) 180 MG tablet [id number] Yes 1 tablet [provider] Taking Active   Garlic 7989 MG CAPS [id number] Yes Take 1,000 mg by mouth.  [provider] Taking Active Self  L-Lysine 500 MG TABS [id number] Yes Take by mouth. [provider] Taking Active            Med Note Gloris Ham, [place] [employer] D   Thu Nov 30, 2020  9:09 AM)    levocetirizine (XYZAL) 5 MG tablet [id number] Yes Take 1 tablet (5 mg total) by mouth every evening. Steele Sizer, MD Taking Active   magnesium oxide (MAG-OX) 400 MG tablet [id number] Yes Take 500 mg by mouth daily.  [provider] Taking Active Self           Med Note Gloris Ham, [place] [employer] D   Thu Nov 30, 2020  9:05 AM)    montelukast (SINGULAIR) 10 MG tablet [id number] Yes Take 1 tablet (10 mg total) by mouth at bedtime. Steele Sizer, MD Taking Active   Nutritional Supplements (JOINT FORMULA PO) [id number] Yes Take 2 tablets by mouth daily. glucosamine [provider] Taking Active   olmesartan-hydrochlorothiazide (BENICAR HCT) 20-12.5 MG tablet [id number] Yes Take 1 tablet by mouth once daily Steele Sizer, MD Taking Active   omeprazole (PRILOSEC) 40 MG capsule [id number] Yes Take 1 capsule (40 mg total) by mouth daily. Steele Sizer, MD Taking Active   Potassium Gluconate 2.5 MEQ TABS [id number] Yes Take 1 tablet by mouth. Pt taking 1 tab 99 mg daily [provider] Taking Active   pravastatin (PRAVACHOL) 20 MG tablet [id number] Yes Take 1 tablet by mouth once daily Steele Sizer, MD Taking Active   prednisoLONE acetate (  PRED FORTE) 1 % ophthalmic suspension [id number] Yes Instill 1 drop into both eyes twice a day [provider] Taking Active   rOPINIRole (REQUIP) 0.5 MG tablet [id number] Yes Take 1 tablet (0.5 mg total)  by mouth at bedtime. 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   BPV (benign positional vertigo) 04/17/2016   RLS (restless legs syndrome) 04/17/2016   GERD (gastroesophageal reflux disease) 03/11/2016   History of hyperparathyroidism 03/11/2016   History of gastric ulcer 03/11/2016   Essential hypertension 04/24/2015   Primary osteoarthritis involving multiple joints 04/24/2015   Lumbago 04/24/2015   Family history of colonic polyps     Immunization History  Administered Date(s) Administered   Fluad Quad(high Dose 65+) 05/25/2020   Influenza, High Dose Seasonal PF 06/05/2015, 06/18/2016, 06/17/2017, 06/09/2018, 06/16/2019   Influenza-Unspecified 06/05/2015   Moderna Sars-Covid-2 Vaccination 10/19/2019, 11/16/2019, 07/26/2020   Pneumococcal Conjugate-13 12/09/2013   Pneumococcal Polysaccharide-23 04/13/2012   Tdap 04/13/2012   Zoster, Live 07/24/2013    Conditions to be addressed/monitored:  Hypertension, Hyperlipidemia, GERD, Osteopenia, Osteoarthritis, and Chronic Bronchitis, and Restless Legs Syndromes  Care Plan : General Pharmacy (Adult)  Updates made by Germaine Pomfret, RPH since 04/13/2021 12:00 AM     Problem: Hypertension, Hyperlipidemia, GERD, Osteopenia, Osteoarthritis, and Chronic Bronchitis, and Restless Legs Syndromes   Priority: High     Long-Range Goal: Patient-Specific Goal   Start Date: 04/13/2021  Expected End Date: 04/13/2022  This Visit's Progress: On track  Priority: High  Note:   Current Barriers:  No barriers noted  Pharmacist Clinical Goal(s):  Patient will maintain control of blood pressure as evidenced by BP less than 140/90  through collaboration with PharmD and provider.    Interventions: 1:1 collaboration with Steele Sizer, MD regarding development and update of comprehensive plan of care as evidenced by provider attestation and co-signature Inter-disciplinary care team collaboration (see longitudinal plan of care) Comprehensive medication review performed; medication list updated in electronic medical record  Hypertension (BP goal <140/90) -Controlled -Current treatment: Amlodipine 2.5 mg daily  Olmesartan-HCTZ 20-12.5 mg daily -Medications previously tried: NA  -Current home readings: NA -Denies hypotensive/hypertensive symptoms -Counseled to monitor BP at home weekly, document, and provide log at future appointments -Recommended to continue current medication  Hyperlipidemia: (LDL goal < 100) -Controlled -Current treatment: Pravastatin 20 mg daily  -Medications previously tried: NA  -Recommended to continue current medication  COPD (Goal: control symptoms and prevent exacerbations) -Controlled -Current treatment  Ventolin HFA 2 puffs every 6 hours as needed Breo Ellipta 1 puff daily  Montelukast 10 mg nightly  -Current treatment  Levocetirizine 5 mg nightly  Fexofenadine 180 mg daily  -Medications previously tried: NA  -Exacerbations requiring treatment in last 6 months: None -Patient reports consistent use of maintenance inhaler -Frequency of rescue inhaler use: Infrequently -Counseled on Proper inhaler technique; -Recommended to continue current medication   Patient Goals/Self-Care Activities Patient will:  - check blood pressure weekly, document, and provide at future appointments  Follow Up Plan: Telephone follow up appointment with care management team member scheduled for:  09/05/2021 at 2:00 PM      Medication Assistance: None required.  Patient affirms current coverage meets needs.  Compliance/Adherence/Medication fill  history: Care Gaps: Shingrix COVID-19 Vaccine  Star-Rating Drugs: Pravastatin 20 mg last  filled on 03/27/2021 for 90 day supply at [facility].  Patient's preferred pharmacy is:  [facility] [address], [place] - [place] [address] Phone: [phone] Fax: [phone]  Uses pill box? Yes Pt endorses 100% compliance  We discussed: Current pharmacy is preferred with insurance plan and patient is satisfied with pharmacy services Patient decided to: Continue current medication management strategy  Care Plan and Follow Up Patient Decision:  Patient agrees to Care Plan and Follow-up.  Plan: Telephone follow up appointment with care management team member scheduled for:  09/05/2021 at 2:00 PM  Malva Limes, [facility] [phone]

## 2021-04-13 NOTE — Patient Instructions (Signed)
Visit Information It was great speaking with you today!  Please let me know if you have any questions about our visit.   Goals Addressed             This Visit's Progress    Track and Manage My Blood Pressure-Hypertension       Timeframe:  Long-Range Goal Priority:  High Start Date:  04/13/2021                            Expected End Date:  04/13/2022                     Follow Up Date 06/22/2021    - check blood pressure weekly    Why is this important?   You won't feel high blood pressure, but it can still hurt your blood vessels.  High blood pressure can cause heart or kidney problems. It can also cause a stroke.  Making lifestyle changes like losing a little weight or eating less salt will help.  Checking your blood pressure at home and at different times of the day can help to control blood pressure.  If the doctor prescribes medicine remember to take it the way the doctor ordered.  Call the office if you cannot afford the medicine or if there are questions about it.     Notes:         Patient Care Plan: General Pharmacy (Adult)     Problem Identified: Hypertension, Hyperlipidemia, GERD, Osteopenia, Osteoarthritis, and Chronic Bronchitis, and Restless Legs Syndromes   Priority: High     Long-Range Goal: Patient-Specific Goal   Start Date: 04/13/2021  Expected End Date: 04/13/2022  This Visit's Progress: On track  Priority: High  Note:   Current Barriers:  No barriers noted  Pharmacist Clinical Goal(s):  Patient will maintain control of blood pressure as evidenced by BP less than 140/90  through collaboration with PharmD and provider.   Interventions: 1:1 collaboration with Alba Cory, MD regarding development and update of comprehensive plan of care as evidenced by provider attestation and co-signature Inter-disciplinary care team collaboration (see longitudinal plan of care) Comprehensive medication review performed; medication list updated in electronic  medical record  Hypertension (BP goal <140/90) -Controlled -Current treatment: Amlodipine 2.5 mg daily  Olmesartan-HCTZ 20-12.5 mg daily -Medications previously tried: NA  -Current home readings: NA -Denies hypotensive/hypertensive symptoms -Counseled to monitor BP at home weekly, document, and provide log at future appointments -Recommended to continue current medication  Hyperlipidemia: (LDL goal < 100) -Controlled -Current treatment: Pravastatin 20 mg daily  -Medications previously tried: NA  -Recommended to continue current medication  COPD (Goal: control symptoms and prevent exacerbations) -Controlled -Current treatment  Ventolin HFA 2 puffs every 6 hours as needed Breo Ellipta 1 puff daily  Montelukast 10 mg nightly  -Current treatment  Levocetirizine 5 mg nightly  Fexofenadine 180 mg daily  -Medications previously tried: NA  -Exacerbations requiring treatment in last 6 months: None -Patient reports consistent use of maintenance inhaler -Frequency of rescue inhaler use: Infrequently -Counseled on Proper inhaler technique; -Recommended to continue current medication   Patient Goals/Self-Care Activities Patient will:  - check blood pressure weekly, document, and provide at future appointments  Follow Up Plan: Telephone follow up appointment with care management team member scheduled for:  09/05/2021 at 2:00 PM      Patient agreed to services and verbal consent obtained.   Patient verbalizes understanding of instructions provided  today and agrees to view in MyChart.   Cheyenne Adas, CPP Clinical Pharmacist [facility] [phone]

## 2021-04-25 NOTE — Progress Notes (Signed)
Name: Ruth [name]   MRN: [id number]    DOB: 08-09-1946   Date:04/26/2021       Progress Note  Subjective  Chief Complaint  Follow Up  HPI  OSA: she started CPAP machine in  May 2020, she was keeping it on for 6 hours, she stopped using months ago, she states she has RLS and neuropathy and has to get up during the night and is too much work to loosen her mask, she states she has been sleeping well lately, advised to try CPAP machine again    GERD: tried to stop omeprazole but unable to tolerated symptoms without medication, she has been unable to take wean down to every other day.   Polyneuropathy: seen by Dr. Sherryll Burger in the past , she states still has some nerve pains, but not as severe , she stopped taking   gabapentin and lyrica because she got drowsy . She has been taking Requip and drinking plenty of water and symptoms have been controlled    HTN: bp is at goal, she is compliant with Benicar/HCTZ and norvasc 2.5 mg daily , bp has been at goal, she asked if she could stop Norvasc 2.5 mg , we will adjust dose Benicar hctz , she will double what she has at home and return for bp check and see if she needs to stay on 40/25 or change to 40/12.5    Dyslipidemia:We started her on medications last year , last LDL was 48, continue current regiment     Asthma: under the care of Dr. [place] Callas, she states well controlled with medication, no wheezing or SOB but she has an occasional cough. She has Breo daly now, and a rescue inhaler prn , reminded her to have yearly flu vaccine   B12 : currently on SL B12 , and doing well, fatigue has improved. She used to take B12 injections but level was high last year.   Senile purpura: reassurance given   Patient Active Problem List   Diagnosis Date Noted   OSA on CPAP 04/15/2019   B12 deficiency 12/09/2018   Dyslipidemia 11/08/2017   Vitamin D deficiency 10/31/2016   Osteopenia of left hip 10/31/2016   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   BPV (benign  positional vertigo) 04/17/2016   RLS (restless legs syndrome) 04/17/2016   GERD (gastroesophageal reflux disease) 03/11/2016   History of hyperparathyroidism 03/11/2016   History of gastric ulcer 03/11/2016   Essential hypertension 04/24/2015   Primary osteoarthritis involving multiple joints 04/24/2015   Lumbago 04/24/2015   Family history of colonic polyps     Past Surgical History:  Procedure Laterality Date   CARPAL TUNNEL RELEASE Bilateral 2000   CATARACT EXTRACTION W/PHACO Left 04/18/2020   Procedure: CATARACT EXTRACTION PHACO AND INTRAOCULAR LENS PLACEMENT (IOC) LEFT 4.78  00:32.8;  Surgeon: Galen Manila, MD;  Location: [facility];  Service: Ophthalmology;  Laterality: Left;   CATARACT EXTRACTION W/PHACO Right 05/09/2020   Procedure: CATARACT EXTRACTION PHACO AND INTRAOCULAR LENS PLACEMENT (IOC) RIGHT 3.41 00:21.8;  Surgeon: Galen Manila, MD;  Location: [facility];  Service: Ophthalmology;  Laterality: Right;   COLONOSCOPY  06/17/2012   A few two mm ulcers were found in the sigmoid colon, no bleeding present,bx taken-path reporet on the few tiny ulcers ,non specific   COLONOSCOPY WITH PROPOFOL N/A 06/11/2017   Procedure: COLONOSCOPY WITH PROPOFOL;  Surgeon: Kieth Brightly, MD;  Location: [facility];  Service: Endoscopy;  Laterality: N/A;   parathyroid surgery  2008  TEMPOROMANDIBULAR JOINT ARTHROPLASTY Left 1984   1989,1990-bilateral   TUBAL LIGATION      Family History  Adopted: Yes  Problem Relation Age of Onset   Kidney disease Mother    Ovarian cancer Mother    Hypertension Father    Stroke Father    Lupus Sister    Breast cancer Maternal Grandmother    Breast cancer Cousin        pat cousins    Social History   Tobacco Use   Smoking status: Never   Smokeless tobacco: Never  Substance Use Topics   Alcohol use: Yes    Comment: wine     Current Outpatient Medications:    acetaminophen (TYLENOL) 500 MG tablet, Take 1-2  tablets (500-1,000 mg total) by mouth every 8 (eight) hours as needed., Disp: 100 tablet, Rfl: 2   albuterol (VENTOLIN HFA) 108 (90 Base) MCG/ACT inhaler, Inhale 2 puffs into the lungs every 6 (six) hours as needed for wheezing or shortness of breath., Disp: 1 each, Rfl: 0   amLODipine (NORVASC) 2.5 MG tablet, Take 1 tablet (2.5 mg total) by mouth daily., Disp: 90 tablet, Rfl: 1   Ascorbic Acid (VITAMIN C) 1000 MG tablet, Take 1,000 mg by mouth., Disp: , Rfl:    BREO ELLIPTA 200-25 MCG/INH AEPB, Take 1 puff by mouth daily., Disp: , Rfl:    Calcium-Vitamin D 600-200 MG-UNIT tablet, Take 1 tablet by mouth 2 (two) times daily. , Disp: , Rfl:    Cholecalciferol (VITAMIN D3) 1000 units CAPS, Take by mouth., Disp: , Rfl:    Cod Liver Oil 1000 MG CAPS, Take by mouth., Disp: , Rfl:    Coenzyme Q10 (COQ10) 200 MG CAPS, Take 200 mg by mouth daily., Disp: , Rfl:    Cyanocobalamin (B-12 PO), Place 2,500 lozenges under the tongue daily., Disp: , Rfl:    Garlic 1000 MG CAPS, Take 1,000 mg by mouth. , Disp: , Rfl:    L-Lysine 500 MG TABS, Take by mouth., Disp: , Rfl:    levocetirizine (XYZAL) 5 MG tablet, Take 1 tablet (5 mg total) by mouth every evening., Disp: 90 tablet, Rfl: 0   magnesium oxide (MAG-OX) 400 MG tablet, Take 500 mg by mouth daily. , Disp: , Rfl:    montelukast (SINGULAIR) 10 MG tablet, Take 1 tablet (10 mg total) by mouth at bedtime., Disp: 90 tablet, Rfl: 1   Nutritional Supplements (JOINT FORMULA PO), Take 2 tablets by mouth daily. glucosamine, Disp: , Rfl:    olmesartan-hydrochlorothiazide (BENICAR HCT) 20-12.5 MG tablet, Take 1 tablet by mouth once daily, Disp: 90 tablet, Rfl: 0   omeprazole (PRILOSEC) 40 MG capsule, Take 1 capsule by mouth once daily, Disp: 90 capsule, Rfl: 0   Potassium Gluconate 2.5 MEQ TABS, Take 1 tablet by mouth. Pt taking 1 tab 99 mg daily, Disp: , Rfl:    pravastatin (PRAVACHOL) 20 MG tablet, Take 1 tablet by mouth once daily, Disp: 90 tablet, Rfl: 0   rOPINIRole  (REQUIP) 0.5 MG tablet, TAKE 1 TABLET BY MOUTH AT BEDTIME, Disp: 90 tablet, Rfl: 0  Current Facility-Administered Medications:    cyanocobalamin ((VITAMIN B-12)) injection 1,000 mcg, 1,000 mcg, Intramuscular, Q30 days, Alba Cory, MD, 1,000 mcg at 12/25/20 6440  Allergies  Allergen Reactions   Ace Inhibitors Cough   Aspirin Nausea Only    Abdominal pain   Crestor [Rosuvastatin]     Muscle pain     I personally reviewed active problem list, medication list, allergies, family history,  social history, health maintenance with the patient/caregiver today.   ROS  Constitutional: Negative for fever or weight change.  Respiratory: Negative for cough and shortness of breath.   Cardiovascular: Negative for chest pain or palpitations.  Gastrointestinal: Negative for abdominal pain, no bowel changes.  Musculoskeletal: Negative for gait problem or joint swelling.  Skin: Negative for rash.  Neurological: Negative for dizziness or headache.  No other specific complaints in a complete review of systems (except as listed in HPI above).   Objective  Vitals:   04/26/21 0809  BP: 130/76  Pulse: 83  Resp: 16  Temp: 98.1 F (36.7 C)  SpO2: 97%  Weight: 143 lb (64.9 kg)  Height: 5\' 3"  (1.6 m)    Body mass index is 25.33 kg/m.  Physical Exam  Constitutional: Patient appears well-developed and well-nourished.  No distress.  HEENT: head atraumatic, normocephalic, pupils equal and reactive to light, neck supple Cardiovascular: Normal rate, regular rhythm and normal heart sounds.  No murmur heard. No BLE edema. Pulmonary/Chest: Effort normal and breath sounds normal. No respiratory distress. Abdominal: Soft.  There is no tenderness. Psychiatric: Patient has a normal mood and affect. behavior is normal. Judgment and thought content normal.   PHQ2/9: Depression screen [facility] 2/9 04/26/2021 01/31/2021 11/30/2020 10/26/2020 04/24/2020  Decreased Interest 0 0 0 0 0  Down, Depressed, Hopeless 0 0 0  0 0  PHQ - 2 Score 0 0 0 0 0  Altered sleeping - 0 - - 0  Tired, decreased energy - 0 - - 0  Change in appetite - 0 - - 0  Feeling bad or failure about yourself  - 0 - - 0  Trouble concentrating - 0 - - 0  Moving slowly or fidgety/restless - 0 - - 0  Suicidal thoughts - 0 - - 0  PHQ-9 Score - 0 - - 0  Difficult doing work/chores - Not difficult at all - - -    phq 9 is negative   Fall Risk: Fall Risk  04/26/2021 01/31/2021 11/30/2020 10/26/2020 04/24/2020  Falls in the past year? 0 0 0 0 0  Number falls in past yr: 0 0 0 0 0  Injury with Fall? 0 0 0 0 0  Risk for fall due to : - - No Fall Risks - -  Follow up - - Falls prevention discussed - -     Functional Status Survey: Is the patient deaf or have difficulty hearing?: No Does the patient have difficulty seeing, even when wearing glasses/contacts?: No Does the patient have difficulty concentrating, remembering, or making decisions?: No Does the patient have difficulty walking or climbing stairs?: No Does the patient have difficulty dressing or bathing?: No Does the patient have difficulty doing errands alone such as visiting a doctor's office or shopping?: No    Assessment & Plan  1. Essential hypertension  - olmesartan-hydrochlorothiazide (BENICAR HCT) 20-12.5 MG tablet; Take 2 tablets by mouth daily. And hold norvasc to see if bp is at goal  Dispense: 1 tablet; Refill: 0  2. Bronchitis, allergic, unspecified asthma severity, uncomplicated  - montelukast (SINGULAIR) 10 MG tablet; Take 1 tablet (10 mg total) by mouth at bedtime.  Dispense: 90 tablet; Refill: 1  3. RLS (restless legs syndrome)  - rOPINIRole (REQUIP) 0.5 MG tablet; Take 1 tablet (0.5 mg total) by mouth at bedtime.  Dispense: 90 tablet; Refill: 1  4. Gastroesophageal reflux disease without esophagitis  - omeprazole (PRILOSEC) 40 MG capsule; Take 1 capsule (40 mg total)  by mouth daily.  Dispense: 90 capsule; Refill: 1  5. Senile purpura (HCC)   6.  Dyslipidemia  - pravastatin (PRAVACHOL) 20 MG tablet; Take 1 tablet (20 mg total) by mouth daily.  Dispense: 90 tablet; Refill: 1  7. Vitamin D deficiency   8. OSA (obstructive sleep apnea)   9. Peripheral polyneuropathy

## 2021-04-26 ENCOUNTER — Other Ambulatory Visit: Payer: Self-pay

## 2021-04-26 ENCOUNTER — Ambulatory Visit (INDEPENDENT_AMBULATORY_CARE_PROVIDER_SITE_OTHER): Payer: Medicare HMO | Admitting: Family Medicine

## 2021-04-26 ENCOUNTER — Encounter: Payer: Self-pay | Admitting: Family Medicine

## 2021-04-26 VITALS — BP 130/76 | HR 83 | Temp 98.1°F | Resp 16 | Ht 63.0 in | Wt 143.0 lb

## 2021-04-26 DIAGNOSIS — G4733 Obstructive sleep apnea (adult) (pediatric): Secondary | ICD-10-CM

## 2021-04-26 DIAGNOSIS — D692 Other nonthrombocytopenic purpura: Secondary | ICD-10-CM

## 2021-04-26 DIAGNOSIS — E785 Hyperlipidemia, unspecified: Secondary | ICD-10-CM

## 2021-04-26 DIAGNOSIS — J45909 Unspecified asthma, uncomplicated: Secondary | ICD-10-CM

## 2021-04-26 DIAGNOSIS — G629 Polyneuropathy, unspecified: Secondary | ICD-10-CM | POA: Diagnosis not present

## 2021-04-26 DIAGNOSIS — G2581 Restless legs syndrome: Secondary | ICD-10-CM

## 2021-04-26 DIAGNOSIS — K219 Gastro-esophageal reflux disease without esophagitis: Secondary | ICD-10-CM | POA: Diagnosis not present

## 2021-04-26 DIAGNOSIS — I10 Essential (primary) hypertension: Secondary | ICD-10-CM | POA: Diagnosis not present

## 2021-04-26 DIAGNOSIS — E559 Vitamin D deficiency, unspecified: Secondary | ICD-10-CM

## 2021-04-26 MED ORDER — OMEPRAZOLE 40 MG PO CPDR: 40.0000 mg | DELAYED_RELEASE_CAPSULE | Freq: Every day | ORAL | 1 refills | Status: DC

## 2021-04-26 MED ORDER — ROPINIROLE HCL 0.5 MG PO TABS: 0.5000 mg | ORAL_TABLET | Freq: Every day | ORAL | 1 refills | Status: DC

## 2021-04-26 MED ORDER — PRAVASTATIN SODIUM 20 MG PO TABS: 20.0000 mg | ORAL_TABLET | Freq: Every day | ORAL | 1 refills | Status: DC

## 2021-04-26 MED ORDER — MONTELUKAST SODIUM 10 MG PO TABS: 10.0000 mg | ORAL_TABLET | Freq: Every day | ORAL | 1 refills | Status: DC

## 2021-04-26 MED ORDER — OLMESARTAN MEDOXOMIL-HCTZ 20-12.5 MG PO TABS: 2.0000 | ORAL_TABLET | Freq: Every day | ORAL | 0 refills | Status: DC

## 2021-05-04 ENCOUNTER — Ambulatory Visit: Payer: Medicare HMO | Admitting: Family Medicine

## 2021-05-10 ENCOUNTER — Other Ambulatory Visit: Payer: Self-pay

## 2021-05-10 ENCOUNTER — Ambulatory Visit: Payer: Medicare HMO

## 2021-05-10 ENCOUNTER — Telehealth: Payer: Self-pay

## 2021-05-10 DIAGNOSIS — I10 Essential (primary) hypertension: Secondary | ICD-10-CM

## 2021-05-10 NOTE — Progress Notes (Signed)
Pt here for blood pressure check.  Blood pressure today is 124/66.  Pt is currently on Benicar 2 tablets a day.

## 2021-05-10 NOTE — Progress Notes (Signed)
    Chronic Care Management Pharmacy Assistant   Name: Ruth [name]  MRN: [id number] DOB: May 25, 1946  Reason for Encounter: Medication Review/General Adherence Call   Recent office visits:  04/26/2021 Dr.Sowles MD (PCP) Change olmesartan-hydrochlorothiazide (BENICAR HCT) 20-12.5 MG tablet; Take 2 tablets by mouth daily. And hold norvasc to see if bp is at goal   Recent consult visits:  No recent Consult Visits  Hospital visits:  None in previous 6 months  Medications: Outpatient Encounter Medications as of 05/10/2021  Medication Sig   acetaminophen (TYLENOL) 500 MG tablet Take 1-2 tablets (500-1,000 mg total) by mouth every 8 (eight) hours as needed.   albuterol (VENTOLIN HFA) 108 (90 Base) MCG/ACT inhaler Inhale 2 puffs into the lungs every 6 (six) hours as needed for wheezing or shortness of breath.   Ascorbic Acid (VITAMIN C) 1000 MG tablet Take 1,000 mg by mouth.   BREO ELLIPTA 200-25 MCG/INH AEPB Take 1 puff by mouth daily.   Calcium-Vitamin D 600-200 MG-UNIT tablet Take 1 tablet by mouth 2 (two) times daily.    Cholecalciferol (VITAMIN D3) 1000 units CAPS Take by mouth.   Cod Liver Oil 1000 MG CAPS Take by mouth.   Coenzyme Q10 (COQ10) 200 MG CAPS Take 200 mg by mouth daily.   Cyanocobalamin (B-12 PO) Place 2,500 lozenges under the tongue daily.   Garlic 1000 MG CAPS Take 1,000 mg by mouth.    L-Lysine 500 MG TABS Take by mouth.   levocetirizine (XYZAL) 5 MG tablet Take 1 tablet (5 mg total) by mouth every evening.   magnesium oxide (MAG-OX) 400 MG tablet Take 500 mg by mouth daily.    montelukast (SINGULAIR) 10 MG tablet Take 1 tablet (10 mg total) by mouth at bedtime.   Nutritional Supplements (JOINT FORMULA PO) Take 2 tablets by mouth daily. glucosamine   olmesartan-hydrochlorothiazide (BENICAR HCT) 20-12.5 MG tablet Take 2 tablets by mouth daily. And hold norvasc to see if bp is at goal   omeprazole (PRILOSEC) 40 MG capsule Take 1 capsule (40 mg total) by mouth daily.    Potassium Gluconate 2.5 MEQ TABS Take 1 tablet by mouth. Pt taking 1 tab 99 mg daily   pravastatin (PRAVACHOL) 20 MG tablet Take 1 tablet (20 mg total) by mouth daily.   rOPINIRole (REQUIP) 0.5 MG tablet Take 1 tablet (0.5 mg total) by mouth at bedtime.   Facility-Administered Encounter Medications as of 05/10/2021  Medication   cyanocobalamin ((VITAMIN B-12)) injection 1,000 mcg    Care Gaps: Shingrix Vaccine Influenza Vaccine Star Rating Drugs: Pravastatin 20 mg last filled 03/27/2021 90 day supply at [facility]. Olmesartan-HCTZ 20-12.5 mg Last filled 03/28/2021 90 day supply at [facility]. Medication Fill Gaps: None ID  Called patient and discussed medication adherence  with patient, no issues at this time with current medication.   Patient denies ED visit since her last CPP follow up.  Patient denies any side effects with her medication. Patient denies any problems with her current pharmacy  Patient states she had some changes made by her PCP with her Olmesartan-HCTZ and stop Amlodipine.Patient reports her blood pressure has been doing wonderful with no complaints.Patient states she is feeling great.  Everlean Cherry Clinical Pharmacist Assistant [phone]

## 2021-05-15 ENCOUNTER — Other Ambulatory Visit: Payer: Self-pay | Admitting: Family Medicine

## 2021-05-15 DIAGNOSIS — I10 Essential (primary) hypertension: Secondary | ICD-10-CM

## 2021-05-15 NOTE — Telephone Encounter (Signed)
Last seen 04/26/2021

## 2021-05-15 NOTE — Telephone Encounter (Signed)
   Notes to clinic:  Verify for refill Looks like patient maybe taking 2 tabs daily    Requested Prescriptions  Pending Prescriptions Disp Refills   olmesartan-hydrochlorothiazide (BENICAR HCT) 20-12.5 MG tablet [Pharmacy Med Name: Olmesartan Medoxomil-HCTZ 20-12.5 MG Oral Tablet] 90 tablet 0    Sig: Take 1 tablet by mouth once daily     Cardiovascular: ARB + Diuretic Combos Passed - 05/15/2021  7:13 AM      Passed - K in normal range and within 180 days    Potassium  Date Value Ref Range Status  11/24/2020 3.8 3.5 - 5.3 mmol/L Final          Passed - Na in normal range and within 180 days    Sodium  Date Value Ref Range Status  11/24/2020 142 135 - 146 mmol/L Final  06/06/2015 141 134 - 144 mmol/L Final          Passed - Cr in normal range and within 180 days    Creat  Date Value Ref Range Status  11/24/2020 0.84 0.60 - 0.93 mg/dL Final    Comment:    For patients >21 years of age, the reference limit for Creatinine is approximately 13% higher for people identified as African-American. .           Passed - Ca in normal range and within 180 days    Calcium  Date Value Ref Range Status  11/24/2020 10.1 8.6 - 10.4 mg/dL Final          Passed - Patient is not pregnant      Passed - Last BP in normal range    BP Readings from Last 1 Encounters:  04/26/21 130/76          Passed - Valid encounter within last 6 months    Recent Outpatient Visits           2 weeks ago Senile purpura [facility])   [facility] Alba Cory, MD   3 months ago Well adult exam   [facility] Alba Cory, MD   6 months ago Essential hypertension   [facility] Alba Cory, MD   1 year ago Dyslipidemia   [facility] Alba Cory, MD   1 year ago Dyslipidemia   [facility] Alba Cory, MD       Future Appointments             In 5 months Carlynn Purl, Danna Hefty, MD [facility]   In 6 months  [facility]

## 2021-05-18 ENCOUNTER — Other Ambulatory Visit: Payer: Self-pay | Admitting: Family Medicine

## 2021-05-18 ENCOUNTER — Ambulatory Visit: Payer: Self-pay | Admitting: *Deleted

## 2021-05-18 DIAGNOSIS — I10 Essential (primary) hypertension: Secondary | ICD-10-CM

## 2021-05-18 NOTE — Telephone Encounter (Signed)
Copied from CRM [id number]. Topic: General - Other >> May 18, 2021 12:02 PM Ruth [name] A wrote: Reason for CRM: The patient has concerns related to their olmesartan-hydrochlorothiazide (BENICAR HCT) 20-12.5 MG tablet [[id number]] prescription  The patient is uncertain if they should continue taking taking two 12.5 MG tablets or if they'll be prescribed 25 MG tablets instead  The patient is concerned that they may run out of the medication sooner than anticipated if the prescription quantity is not modified   Please contact further when possible

## 2021-05-18 NOTE — Telephone Encounter (Signed)
    Reason for Disposition  [1] Caller has NON-URGENT medicine question about med that PCP prescribed AND [2] triager unable to answer question  Answer Assessment - Initial Assessment Questions 1. NAME of MEDICATION: "What medicine are you calling about?"     Olmesartan- hydrochlorothiazide ( benicar HCT) 20-12.5 mg  2. QUESTION: "What is your question?" (e.g., double dose of medicine, side effect)     Will next refill give enough medication to last for 3 months due to increase in how many tabs she is supposed to take  3. PRESCRIBING HCP: "Who prescribed it?" Reason: if prescribed by specialist, call should be referred to that group.     Dr. Carlynn Purl  4. SYMPTOMS: "Do you have any symptoms?"     na 5. SEVERITY: If symptoms are present, ask "Are they mild, moderate or severe?"     na 6. PREGNANCY:  "Is there any chance that you are pregnant?" "When was your last menstrual period?"     na  Protocols used: Medication Question Call-A-AH

## 2021-05-18 NOTE — Telephone Encounter (Signed)
Patient called to ask if she will receive refills adequate to the amount needed for 3 month supply, due to dose increase for olmesartan- hydrochlorothiazide ( Benicar HCT) . Patient reports she was taking 1 tab now will be taking 2 tabs. Please clarify for patient.  Reported to patient to contact pharmacy if needed. Pharmacy should be able to give adequate amount of medication due to her increase in dose from PCP. If she needs refill call clinic to request medication refill. Patient verbalized understanding.

## 2021-05-18 NOTE — Telephone Encounter (Signed)
Requested medication (s) are due for refill today: Yes  Requested medication (s) are on the active medication list: Yes  Last refill:  05/15/21  Future visit scheduled: Yes  Notes to clinic:  Will need new Rx with taking 2 tabs daily, see pharmacy notes     Requested Prescriptions  Pending Prescriptions Disp Refills   olmesartan-hydrochlorothiazide (BENICAR HCT) 20-12.5 MG tablet [Pharmacy Med Name: OLMESARTAN HCTZ 20-12.5MG  TAB] 90 tablet 1    Sig: Take 1 tablet by mouth once daily     Cardiovascular: ARB + Diuretic Combos Passed - 05/18/2021  4:26 PM      Passed - K in normal range and within 180 days    Potassium  Date Value Ref Range Status  11/24/2020 3.8 3.5 - 5.3 mmol/L Final          Passed - Na in normal range and within 180 days    Sodium  Date Value Ref Range Status  11/24/2020 142 135 - 146 mmol/L Final  06/06/2015 141 134 - 144 mmol/L Final          Passed - Cr in normal range and within 180 days    Creat  Date Value Ref Range Status  11/24/2020 0.84 0.60 - 0.93 mg/dL Final    Comment:    For patients >57 years of age, the reference limit for Creatinine is approximately 13% higher for people identified as African-American. .           Passed - Ca in normal range and within 180 days    Calcium  Date Value Ref Range Status  11/24/2020 10.1 8.6 - 10.4 mg/dL Final          Passed - Patient is not pregnant      Passed - Last BP in normal range    BP Readings from Last 1 Encounters:  04/26/21 130/76          Passed - Valid encounter within last 6 months    Recent Outpatient Visits           3 weeks ago Senile purpura [facility])   [facility] Alba Cory, MD   3 months ago Well adult exam   [facility] Alba Cory, MD   6 months ago Essential hypertension   [facility] Alba Cory, MD   1 year ago Dyslipidemia   [facility] Alba Cory, MD   1 year  ago Dyslipidemia   [facility] Alba Cory, MD       Future Appointments             In 5 months Carlynn Purl, Danna Hefty, MD [facility], [facility]   In 6 months  [facility]

## 2021-05-21 ENCOUNTER — Other Ambulatory Visit: Payer: Self-pay | Admitting: Family Medicine

## 2021-05-21 ENCOUNTER — Other Ambulatory Visit: Payer: Self-pay

## 2021-05-21 DIAGNOSIS — I10 Essential (primary) hypertension: Secondary | ICD-10-CM

## 2021-05-21 MED ORDER — OLMESARTAN MEDOXOMIL-HCTZ 40-25 MG PO TABS: 1.0000 | ORAL_TABLET | Freq: Every day | ORAL | 0 refills | Status: DC

## 2021-05-23 ENCOUNTER — Telehealth: Payer: Self-pay

## 2021-05-23 NOTE — Telephone Encounter (Signed)
Copied from CRM [id number]. Topic: Quick Communication - Rx Refill/Question >> May 23, 2021  4:05 PM Pawlus, Maxine Glenn A wrote: Pt had some questions regarding her medications and dosage, pt was under the impression she was supposed to take 2 tablets of olmesartan-hydrochlorothiazide (BENICAR HCT) 40-25 MG tablet, pt stated the instructions are not correct.

## 2021-05-24 NOTE — Telephone Encounter (Signed)
Left voicemail to clarify medication dosage with patient. Advised to return our call if she has any further questions.

## 2021-05-24 NOTE — Telephone Encounter (Signed)
Spoke with patient, clarified information.

## 2021-05-24 NOTE — Telephone Encounter (Signed)
Pt is calling back concerning bp medication and she will be out of medication today. Pt needs clarification

## 2021-06-04 ENCOUNTER — Other Ambulatory Visit: Payer: Self-pay

## 2021-06-04 ENCOUNTER — Ambulatory Visit (INDEPENDENT_AMBULATORY_CARE_PROVIDER_SITE_OTHER): Payer: Medicare HMO

## 2021-06-04 DIAGNOSIS — Z23 Encounter for immunization: Secondary | ICD-10-CM | POA: Diagnosis not present

## 2021-06-04 MED ORDER — SHINGRIX 50 MCG/0.5ML IM SUSR
0.5000 mL | Freq: Once | INTRAMUSCULAR | 0 refills | Status: AC
Start: 2021-06-04 — End: 2021-06-04

## 2021-06-13 ENCOUNTER — Telehealth: Payer: Self-pay

## 2021-06-13 NOTE — Progress Notes (Signed)
Chronic Care Management Pharmacy Assistant   Name: Ruth [name]  MRN: [id number] DOB: 1946/05/07  Reason for Encounter:Hypertension Disease State Call.   Recent office visits:  No recent Office Visit  Recent consult visits:  No recent Consult Visit  Hospital visits:  None in previous 6 months  Medications: Outpatient Encounter Medications as of 06/13/2021  Medication Sig   acetaminophen (TYLENOL) 500 MG tablet Take 1-2 tablets (500-1,000 mg total) by mouth every 8 (eight) hours as needed.   albuterol (VENTOLIN HFA) 108 (90 Base) MCG/ACT inhaler Inhale 2 puffs into the lungs every 6 (six) hours as needed for wheezing or shortness of breath.   Ascorbic Acid (VITAMIN C) 1000 MG tablet Take 1,000 mg by mouth.   BREO ELLIPTA 200-25 MCG/INH AEPB Take 1 puff by mouth daily.   Calcium-Vitamin D 600-200 MG-UNIT tablet Take 1 tablet by mouth 2 (two) times daily.    Cholecalciferol (VITAMIN D3) 1000 units CAPS Take by mouth.   Cod Liver Oil 1000 MG CAPS Take by mouth.   Coenzyme Q10 (COQ10) 200 MG CAPS Take 200 mg by mouth daily.   Cyanocobalamin (B-12 PO) Place 2,500 lozenges under the tongue daily.   Garlic 1000 MG CAPS Take 1,000 mg by mouth.    L-Lysine 500 MG TABS Take by mouth.   levocetirizine (XYZAL) 5 MG tablet Take 1 tablet (5 mg total) by mouth every evening.   magnesium oxide (MAG-OX) 400 MG tablet Take 500 mg by mouth daily.    montelukast (SINGULAIR) 10 MG tablet Take 1 tablet (10 mg total) by mouth at bedtime.   Nutritional Supplements (JOINT FORMULA PO) Take 2 tablets by mouth daily. glucosamine   olmesartan-hydrochlorothiazide (BENICAR HCT) 40-25 MG tablet Take 1 tablet by mouth daily.   omeprazole (PRILOSEC) 40 MG capsule Take 1 capsule (40 mg total) by mouth daily.   Potassium Gluconate 2.5 MEQ TABS Take 1 tablet by mouth. Pt taking 1 tab 99 mg daily   pravastatin (PRAVACHOL) 20 MG tablet Take 1 tablet (20 mg total) by mouth daily.   rOPINIRole (REQUIP) 0.5 MG  tablet Take 1 tablet (0.5 mg total) by mouth at bedtime.   Facility-Administered Encounter Medications as of 06/13/2021  Medication   cyanocobalamin ((VITAMIN B-12)) injection 1,000 mcg   Care Gaps: Shingrix Vaccine Star Rating Drugs: Pravastatin 20 mg last filled 06/13/2021 90 day supply at [facility]. Olmesartan-HCTZ 20-12.5 mg Last filled 05/23/2021 90 day supply at [facility]. Medication Fill Gaps: None ID  Reviewed chart prior to disease state call. Spoke with patient regarding BP  Recent Office Vitals: BP Readings from Last 3 Encounters:  04/26/21 130/76  01/31/21 124/78  10/26/20 132/84   Pulse Readings from Last 3 Encounters:  04/26/21 83  01/31/21 74  10/26/20 90    Wt Readings from Last 3 Encounters:  04/26/21 143 lb (64.9 kg)  01/31/21 140 lb 3.2 oz (63.6 kg)  10/26/20 139 lb 1.6 oz (63.1 kg)     Kidney Function Lab Results  Component Value Date/Time   CREATININE 0.84 11/24/2020 09:39 AM   CREATININE 0.73 10/18/2019 10:27 AM   GFRNONAA 68 11/24/2020 09:39 AM   GFRAA 79 11/24/2020 09:39 AM    BMP Latest Ref Rng & Units 11/24/2020 10/18/2019 11/12/2018  Glucose 65 - 99 mg/dL 84 82 74  BUN 7 - 25 mg/dL 13 14 14   Creatinine 0.60 - 0.93 mg/dL 1.85 6.31  BUN/Creat Ratio 6 - 22 (calc) NOT APPLICABLE NOT APPLICABLE NOT APPLICABLE  Sodium 135 - 146 mmol/L 142 141 142  Potassium 3.5 - 5.3 mmol/L 3.8 3.7 3.6  Chloride 98 - 110 mmol/L 105 105 102  CO2 20 - 32 mmol/L 27 28 28   Calcium 8.6 - 10.4 mg/dL 97.3 53.2    Current antihypertensive regimen:   olmesartan-hydrochlorothiazide (BENICAR HCT) 40-25 MG 1 tablet daily   Patient denies lightheaded,dizziness, but reports headaches before she has her coffee.Patient states once she has her coffee her headaches improves. Patient denies any issues with the medication changes of olmesartan-HCTZ.  How often are you checking your Blood Pressure? infrequently Current home BP readings:  Patient states  she has not been checking her blood pressure the way she should,but she will start checking it every other day to make sure her medications are doing what it should. Patient reports her blood pressure two weeks ago was 120/79. What recent interventions/DTPs have been made by any provider to improve Blood Pressure control since last CPP Visit: None Any recent hospitalizations or ED visits since last visit with CPP? No What diet changes have been made to improve Blood Pressure Control?  Patient states she use all Nature's blend season instead of salt. What exercise is being done to improve your Blood Pressure Control?  Patient states she does not exercise on a regular bases, but she stays active moving around,fishing, and walking here and there.   Patient states before her B-12 injection was stop she never took a nap,but after her B12 was stop she started sleeping more then usual.Patient began taking B12 2,000 mg vitamin.Patient admitted she was not taking the B12 correctly at first, but now she will place the tablet under her tongue as directed.Patient reports since she made the change of the correct way to take B12 she has notice she is sleeping less.Patient states she will reach out if she feels she needs to have her B12 blood work check again to make sure she doesn't need to restart the injection as before.Patient reports she will continue to take the B12 PO.  Patient reports received  her first dose of Shingrix from 99.2.Patient states she will go back in 2-6 months for her 2nd dose.  Patient states she was going to get her booster for COVID-19 Moderna ,but [facility] did not have it in stock.Patient reports [facility] will reach out to her when they have it in stock.  Adherence Review: Is the patient currently on ACE/ARB medication? Yes Does the patient have >5 day gap between last estimated fill dates? No   Telephone follow up appointment with Care management team  member scheduled for : 09/05/2021 at 2:00 pm.  09/07/2021 Clinical Pharmacist Assistant [phone]

## 2021-07-18 DIAGNOSIS — M65342 Trigger finger, left ring finger: Secondary | ICD-10-CM | POA: Diagnosis not present

## 2021-07-18 DIAGNOSIS — M65332 Trigger finger, left middle finger: Secondary | ICD-10-CM | POA: Diagnosis not present

## 2021-07-18 DIAGNOSIS — M65331 Trigger finger, right middle finger: Secondary | ICD-10-CM | POA: Diagnosis not present

## 2021-08-06 ENCOUNTER — Telehealth: Payer: Self-pay

## 2021-08-06 NOTE — Progress Notes (Signed)
Chronic Care Management Pharmacy Assistant   Name: Ruth [name]  MRN: [id number] DOB: [birth year]  Reason for Encounter:Hypertension Disease State Call.  Recent office visits:  No recent Office Visit   Recent consult visits:  No recent Consult Visit  Hospital visits:  None in previous 6 months  Medications: Outpatient Encounter Medications as of 08/06/2021  Medication Sig   acetaminophen (TYLENOL) 500 MG tablet Take 1-2 tablets (500-1,000 mg total) by mouth every 8 (eight) hours as needed.   albuterol (VENTOLIN HFA) 108 (90 Base) MCG/ACT inhaler Inhale 2 puffs into the lungs every 6 (six) hours as needed for wheezing or shortness of breath.   Ascorbic Acid (VITAMIN C) 1000 MG tablet Take 1,000 mg by mouth.   BREO ELLIPTA 200-25 MCG/INH AEPB Take 1 puff by mouth daily.   Calcium-Vitamin D 600-200 MG-UNIT tablet Take 1 tablet by mouth 2 (two) times daily.    Cholecalciferol (VITAMIN D3) 1000 units CAPS Take by mouth.   Cod Liver Oil 1000 MG CAPS Take by mouth.   Coenzyme Q10 (COQ10) 200 MG CAPS Take 200 mg by mouth daily.   Cyanocobalamin (B-12 PO) Place 2,500 lozenges under the tongue daily.   Garlic 1000 MG CAPS Take 1,000 mg by mouth.    L-Lysine 500 MG TABS Take by mouth.   levocetirizine (XYZAL) 5 MG tablet Take 1 tablet (5 mg total) by mouth every evening.   magnesium oxide (MAG-OX) 400 MG tablet Take 500 mg by mouth daily.    montelukast (SINGULAIR) 10 MG tablet Take 1 tablet (10 mg total) by mouth at bedtime.   Nutritional Supplements (JOINT FORMULA PO) Take 2 tablets by mouth daily. glucosamine   olmesartan-hydrochlorothiazide (BENICAR HCT) 40-25 MG tablet Take 1 tablet by mouth daily.   omeprazole (PRILOSEC) 40 MG capsule Take 1 capsule (40 mg total) by mouth daily.   Potassium Gluconate 2.5 MEQ TABS Take 1 tablet by mouth. Pt taking 1 tab 99 mg daily   pravastatin (PRAVACHOL) 20 MG tablet Take 1 tablet (20 mg total) by mouth daily.   rOPINIRole (REQUIP) 0.5 MG  tablet Take 1 tablet (0.5 mg total) by mouth at bedtime.   Facility-Administered Encounter Medications as of 08/06/2021  Medication   cyanocobalamin ((VITAMIN B-12)) injection 1,000 mcg      Care Gaps: Shingrix Vaccine Covid Vaccine Star Rating Drugs: Pravastatin 20 mg last filled 06/13/2021 90 day supply at [facility]. Olmesartan-HCTZ 20-12.5 mg Last filled 05/23/2021 90 day supply at [facility]. Medication Fill Gaps: None ID  Reviewed chart prior to disease state call. Spoke with patient regarding BP  Recent Office Vitals: BP Readings from Last 3 Encounters:  04/26/21 130/76  01/31/21 124/78  10/26/20 132/84   Pulse Readings from Last 3 Encounters:  04/26/21 83  01/31/21 74  10/26/20 90    Wt Readings from Last 3 Encounters:  04/26/21 143 lb (64.9 kg)  01/31/21 140 lb 3.2 oz (63.6 kg)  10/26/20 139 lb 1.6 oz (63.1 kg)     Kidney Function Lab Results  Component Value Date/Time   CREATININE 0.84 11/24/2020 09:39 AM   CREATININE 0.73 10/18/2019 10:27 AM   GFRNONAA 68 11/24/2020 09:39 AM   GFRAA 79 11/24/2020 09:39 AM    BMP Latest Ref Rng & Units 11/24/2020 10/18/2019 11/12/2018  Glucose 65 - 99 mg/dL 84 82 74  BUN 7 - 25 mg/dL 13 14 14   Creatinine 0.60 - 0.93 mg/dL 2.29 7.98  BUN/Creat Ratio 6 - 22 (calc) NOT  APPLICABLE NOT APPLICABLE NOT APPLICABLE  Sodium 135 - 146 mmol/L 142 141 142  Potassium 3.5 - 5.3 mmol/L 3.8 3.7 3.6  Chloride 98 - 110 mmol/L 105 105 102  CO2 20 - 32 mmol/L 27 28 28   Calcium 8.6 - 10.4 mg/dL 89.3 73.4    Current antihypertensive regimen:  olmesartan-hydrochlorothiazide (BENICAR HCT) 40-25 MG 1 tablet daily  Patient denies lightheadedness,headaches, or dizziness.  How often are you checking your Blood Pressure? 3-5x per week Current home BP readings:  Patient states her blood pressure has been doing "good" ranging around 124/76.Patient states she has been checking her blood pressure more, but does not have her  log on hand because she is out of town. What recent interventions/DTPs have been made by any provider to improve Blood Pressure control since last CPP Visit: None ID Any recent hospitalizations or ED visits since last visit with CPP? No What diet changes have been made to improve Blood Pressure Control?  Patient denies any changes her diet. What exercise is being done to improve your Blood Pressure Control?  Patient denies any changes.  Patient reports she is currently on vacation, and denies any refills/concerns for the clinical pharmacist.Patient is aware she can return my call if she has any questions or concerns.  Adherence Review: Is the patient currently on ACE/ARB medication? Yes Does the patient have >5 day gap between last estimated fill dates? No  Telephone follow up appointment with Care management team member scheduled for : 09/05/2021 at 2:00 pm.  09/07/2021 Clinical Pharmacist Assistant [phone]

## 2021-08-13 ENCOUNTER — Other Ambulatory Visit: Payer: Self-pay | Admitting: Family Medicine

## 2021-08-23 ENCOUNTER — Other Ambulatory Visit: Payer: Self-pay | Admitting: Family Medicine

## 2021-09-04 ENCOUNTER — Telehealth: Payer: Self-pay

## 2021-09-04 NOTE — Progress Notes (Signed)
APPOINTMENT REMINDER   Ruth [name] was reminded to have all medications, supplements and any blood glucose and blood pressure readings available for review with Angelena Sole, Pharm. D, at her telephone visit on 09/05/2021 at 2:00 pm.  Patient confirm appointment.  Everlean Cherry Clinical Pharmacist Assistant [phone]

## 2021-09-05 ENCOUNTER — Ambulatory Visit (INDEPENDENT_AMBULATORY_CARE_PROVIDER_SITE_OTHER): Payer: Medicare HMO

## 2021-09-05 DIAGNOSIS — G4733 Obstructive sleep apnea (adult) (pediatric): Secondary | ICD-10-CM

## 2021-09-05 DIAGNOSIS — I10 Essential (primary) hypertension: Secondary | ICD-10-CM

## 2021-09-05 DIAGNOSIS — K219 Gastro-esophageal reflux disease without esophagitis: Secondary | ICD-10-CM

## 2021-09-05 DIAGNOSIS — Z9989 Dependence on other enabling machines and devices: Secondary | ICD-10-CM

## 2021-09-05 NOTE — Progress Notes (Signed)
Chronic Care Management Pharmacy Note  09/05/2021 Name:  Ruth [name] MRN:  [id number] DOB:  May 29, 1946  Summary: Patient presents today for CCM follow-up. Her blood pressures are well controlled at home. She has not been using her CPAP machine.    Recommendations/Changes made from today's visit: -Continue current medications -Discussed a slow taper of omeprazole and utilization of either Tums or Pepcid to help manage rebound symptoms.   -Counseled patient on importance of CPAP use.   Plan: CPP follow-up in 6 months    Subjective: Ruth [name] is an 75 y.o. year old female who is a primary patient of Steele Sizer, MD.  The CCM team was consulted for assistance with disease management and care coordination needs.    Engaged with patient by telephone for follow up visit in response to provider referral for pharmacy case management and/or care coordination services.   Consent to Services:  The patient was given information about Chronic Care Management services, agreed to services, and gave verbal consent prior to initiation of services.  Please see initial visit note for detailed documentation.   Patient Care Team: Steele Sizer, MD as PCP - [name] (Family Medicine) Mosetta Anis, MD as Referring Physician (Allergy) Vladimir Crofts, MD as Consulting Physician (Neurology) Germaine Pomfret, [facility] as Pharmacist (Pharmacist)  Recent office visits: 04/26/21: Patient presented to Dr. Ancil Boozer for follow-up. Amlodipine stopped, Benicar increased to 40-25 mg daily.  01/31/21: Patient presentes to Dr. Ancil Boozer for follow-up. Metronidazole + Bactrim for abscess.  11/15/20: Patient presents to Dr. Ancil Boozer for follow-up.   Recent consult visits: None in previous 6 months  Hospital visits: None in previous 6 months   Objective:  Lab Results  Component Value Date   CREATININE 0.84 11/24/2020   BUN 13 11/24/2020   GFRNONAA 68 11/24/2020   GFRAA 79 11/24/2020   NA 142  11/24/2020   K 3.8 11/24/2020   CALCIUM 10.1 11/24/2020   CO2 27 11/24/2020   GLUCOSE 84 11/24/2020    Lab Results  Component Value Date/Time   HGBA1C 5.3 10/31/2016 09:26 AM    Last diabetic Eye exam: No results found for: HMDIABEYEEXA  Last diabetic Foot exam: No results found for: HMDIABFOOTEX   Lab Results  Component Value Date   CHOL 124 11/24/2020   HDL 53 11/24/2020   LDLCALC 48 11/24/2020   TRIG 142 11/24/2020   CHOLHDL 2.3 11/24/2020    Hepatic Function Latest Ref Rng & Units 11/24/2020 10/18/2019 11/12/2018  Total Protein 6.1 - 8.1 g/dL 7.0 7.1 7.0  Albumin 3.6 - 5.1 g/dL - - -  AST 10 - 35 U/L $Remo'20 22 23  'OVgeU$ ALT 6 - 29 U/L $Remo'15 21 17  'yMfYi$ Alk Phosphatase 33 - 130 U/L - - -  Total Bilirubin 0.2 - 1.2 mg/dL 0.4 0.4 0.5    Lab Results  Component Value Date/Time   TSH 0.94 11/05/2017 09:49 AM   TSH 0.837 06/06/2015 08:34 AM    CBC Latest Ref Rng & Units 11/24/2020 10/18/2019 11/12/2018  WBC 3.8 - 10.8 Thousand/uL 7.4 7.7 8.5  Hemoglobin 11.7 - 15.5 g/dL 13.6 13.7 14.1  Hematocrit 35.0 - 45.0 % 39.5 40.3 39.9  Platelets 140 - 400 Thousand/uL 266 281 334    Lab Results  Component Value Date/Time   VD25OH 44 11/24/2020 09:39 AM   VD25OH 38 10/18/2019 10:27 AM    Clinical ASCVD: No  The ASCVD Risk score (Arnett DK, et al., 2019) failed to calculate for the following reasons:  The valid total cholesterol range is 130 to 320 mg/dL    Depression screen [facility] 2/9 04/26/2021 01/31/2021 11/30/2020  Decreased Interest 0 0 0  Down, Depressed, Hopeless 0 0 0  PHQ - 2 Score 0 0 0  Altered sleeping - 0 -  Tired, decreased energy - 0 -  Change in appetite - 0 -  Feeling bad or failure about yourself  - 0 -  Trouble concentrating - 0 -  Moving slowly or fidgety/restless - 0 -  Suicidal thoughts - 0 -  PHQ-9 Score - 0 -  Difficult doing work/chores - Not difficult at all -    Social History   Tobacco Use  Smoking Status Never  Smokeless Tobacco Never   BP Readings from Last 3  Encounters:  04/26/21 130/76  01/31/21 124/78  10/26/20 132/84   Pulse Readings from Last 3 Encounters:  04/26/21 83  01/31/21 74  10/26/20 90   Wt Readings from Last 3 Encounters:  04/26/21 143 lb (64.9 kg)  01/31/21 140 lb 3.2 oz (63.6 kg)  10/26/20 139 lb 1.6 oz (63.1 kg)   BMI Readings from Last 3 Encounters:  04/26/21 25.33 kg/m  01/31/21 24.84 kg/m  10/26/20 24.64 kg/m    Assessment/Interventions: Review of patient past medical history, allergies, medications, health status, including review of consultants reports, laboratory and other test data, was performed as part of comprehensive evaluation and provision of chronic care management services.   SDOH:  (Social Determinants of Health) assessments and interventions performed: Yes SDOH Interventions    Flowsheet Row Most Recent Value  SDOH Interventions   Financial Strain Interventions Intervention Not Indicated       SDOH Screenings   Alcohol Screen: Not on file  Depression (PHQ2-9): Low Risk    PHQ-2 Score: 0  Financial Resource Strain: Low Risk    Difficulty of Paying Living Expenses: Not hard at all  Food Insecurity: No Food Insecurity   Worried About Charity fundraiser in the Last Year: Never true   Ran Out of Food in the Last Year: Never true  Housing: Low Risk    Last Housing Risk Score: 0  Physical Activity: Inactive   Days of Exercise per Week: 0 days   Minutes of Exercise per Session: 0 min  Social Connections: Engineer, building services of Communication with Friends and Family: More than three times a week   Frequency of Social Gatherings with Friends and Family: More than three times a week   Attends Religious Services: 1 to 4 times per year   Active Member of [employer] or Organizations: Yes   Attends Music therapist: More than 4 times per year   Marital Status: Married  Stress: No Stress Concern Present   Feeling of Stress : Not at all  Tobacco Use: Low Risk    Smoking  Tobacco Use: Never   Smokeless Tobacco Use: Never   Passive Exposure: Not on file  Transportation Needs: No Transportation Needs   Lack of Transportation (Medical): No   Lack of Transportation (Non-Medical): No    CCM Care Plan  Allergies  Allergen Reactions   Ace Inhibitors Cough   Aspirin Nausea Only    Abdominal pain   Crestor [Rosuvastatin]     Muscle pain     Medications Reviewed Today     Reviewed by Carlene Coria, CMA (Certified Medical Assistant) on 04/26/21 at [place] List Status: <None>   Medication Order Taking? Sig Documenting Provider Last  Dose Status Informant  acetaminophen (TYLENOL) 500 MG tablet [id number] Yes Take 1-2 tablets (500-1,000 mg total) by mouth every 8 (eight) hours as needed. Steele Sizer, MD Taking Active   albuterol (VENTOLIN HFA) 108 (90 Base) MCG/ACT inhaler [id number] Yes Inhale 2 puffs into the lungs every 6 (six) hours as needed for wheezing or shortness of breath. Steele Sizer, MD Taking Active   amLODipine (NORVASC) 2.5 MG tablet [id number] Yes Take 1 tablet (2.5 mg total) by mouth daily. Steele Sizer, MD Taking Active   Ascorbic Acid (VITAMIN C) 1000 MG tablet [id number] Yes Take 1,000 mg by mouth. [provider] Taking Active            Med Note Clemetine Marker D   Thu Nov 30, 2020  9:09 AM)    [place] Patter 200-25 MCG/INH AEPB [id number] Yes Take 1 puff by mouth daily. Mosetta Anis, MD Taking Active   Calcium-Vitamin D 600-200 MG-UNIT tablet [id number] Yes Take 1 tablet by mouth 2 (two) times daily.  [provider] Taking Active Self           Med Note Gloris Ham, [place] [employer] D   Thu Nov 30, 2020  9:06 AM)    Cholecalciferol (VITAMIN D3) 1000 units CAPS [id number] Yes Take by mouth. [provider] Taking Active            Med Note Clemetine Marker D   Thu Nov 30, 2020  9:10 AM)    Cod Liver Oil 1000 MG CAPS [id number] Yes Take by mouth. [provider] Taking Active   Coenzyme Q10 (COQ10) 200 MG CAPS  [id number] Yes Take 200 mg by mouth daily. [provider] Taking Active   cyanocobalamin ((VITAMIN B-12)) injection 1,000 mcg [id number]   Steele Sizer, MD  Active   Cyanocobalamin (B-12 PO) [id number] Yes Place 2,500 lozenges under the tongue daily. [provider] Taking Active   Garlic 2355 MG CAPS [id number] Yes Take 1,000 mg by mouth.  [provider] Taking Active Self  L-Lysine 500 MG TABS [id number] Yes Take by mouth. [provider] Taking Active            Med Note Gloris Ham, [place] [employer] D   Thu Nov 30, 2020  9:09 AM)    levocetirizine (XYZAL) 5 MG tablet [id number] Yes Take 1 tablet (5 mg total) by mouth every evening. Steele Sizer, MD Taking Active   magnesium oxide (MAG-OX) 400 MG tablet [id number] Yes Take 500 mg by mouth daily.  [provider] Taking Active Self           Med Note Gloris Ham, [place] [employer] D   Thu Nov 30, 2020  9:05 AM)    montelukast (SINGULAIR) 10 MG tablet [id number] Yes Take 1 tablet (10 mg total) by mouth at bedtime. Steele Sizer, MD Taking Active   Nutritional Supplements (JOINT FORMULA PO) [id number] Yes Take 2 tablets by mouth daily. glucosamine [provider] Taking Active   olmesartan-hydrochlorothiazide (BENICAR HCT) 20-12.5 MG tablet [id number] Yes Take 1 tablet by mouth once daily Steele Sizer, MD Taking Active   omeprazole (PRILOSEC) 40 MG capsule [id number] Yes Take 1 capsule by mouth once daily Steele Sizer, MD Taking Active   Potassium Gluconate 2.5 MEQ TABS [id number] Yes Take 1 tablet by mouth. 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   BPV (benign positional vertigo) 04/17/2016   RLS (restless legs syndrome) 04/17/2016   GERD (gastroesophageal reflux disease) 03/11/2016   History of hyperparathyroidism 03/11/2016   History of gastric ulcer 03/11/2016   Essential hypertension 04/24/2015   Primary osteoarthritis involving multiple joints 04/24/2015   Lumbago 04/24/2015   Family history of colonic polyps     Immunization History  Administered Date(s) Administered   Fluad Quad(high Dose 65+) 05/25/2020, 06/04/2021   Influenza, High Dose Seasonal PF 06/05/2015, 06/18/2016, 06/17/2017, 06/09/2018, 06/16/2019   Influenza-Unspecified 06/05/2015   Moderna Sars-Covid-2 Vaccination 10/19/2019, 11/16/2019, 07/26/2020, 01/06/2021   Pneumococcal Conjugate-13 12/09/2013   Pneumococcal Polysaccharide-23 04/13/2012   Tdap 04/13/2012   Zoster, Live 07/24/2013    Conditions to be addressed/monitored:  Hypertension, Hyperlipidemia, GERD, Osteopenia, Osteoarthritis, and Chronic Bronchitis, and Restless Legs Syndromes  Care Plan : [name] (Adult)  Updates made by Germaine Pomfret, RPH since 09/05/2021 12:00 AM     Problem: Hypertension, Hyperlipidemia, GERD, Osteopenia, Osteoarthritis, and Chronic Bronchitis, and Restless Legs Syndromes   Priority: High     Long-Range Goal: Patient-Specific Goal   Start Date: 04/13/2021  Expected End Date: 04/13/2022  This Visit's Progress: On track  Recent Progress: On track  Priority: High  Note:   Current Barriers:  No barriers noted  Pharmacist Clinical Goal(s):  Patient will maintain control of blood pressure as evidenced by BP less than 140/90  through collaboration with PharmD and provider.   Interventions: 1:1 collaboration with Steele Sizer, MD regarding development and update of comprehensive plan of care as  evidenced by provider attestation and co-signature Inter-disciplinary care team collaboration (see longitudinal plan of care) Comprehensive medication review performed; medication list updated in electronic medical record  Hypertension (BP goal <140/90) -Controlled -Current treatment: Olmesartan-HCTZ 40-25 mg daily -Medications previously tried: NA  -Current home readings:   125/67 -Denies hypotensive/hypertensive symptoms -Counseled to monitor BP at home weekly, document, and provide log at future appointments -Recommended to continue current medication  Hyperlipidemia: (LDL goal < 100) -Controlled -Current treatment: Pravastatin 20 mg daily  -Medications previously tried: NA  -Recommended to continue current medication  COPD (Goal: control symptoms and prevent exacerbations) -Controlled -Current treatment  Ventolin HFA 2 puffs every 6 hours as needed Breo Ellipta 1 puff daily  Montelukast 10 mg nightly  -Current treatment  Levocetirizine 5 mg nightly  Fexofenadine 180 mg daily  -Medications previously tried: NA  -Exacerbations requiring treatment in last 6 months: None -Patient reports consistent use of maintenance inhaler -Frequency of rescue inhaler use: Infrequently -Counseled patient on importance of CPAP use.  -Recommended to continue current medication  GERD (Goal: Prevent heartburn) -Controlled -Current treatment  Omeprazole 40 mg daily  -Medications previously tried: NA -Patient with significant rebound heartburn, but interested in eventually retrying to discontinue medication. Discussed a slow taper of omeprazole and utilization of either Tums or Pepcid to help manage rebound symptoms.   -Recommended to continue current medication   Patient Goals/Self-Care Activities Patient will:  - check blood pressure weekly, document, and provide at future appointments  Follow Up Plan: Telephone follow up appointment with  care management team member scheduled for:   09/05/2021 at 2:00 PM       Medication Assistance: None required.  Patient affirms current coverage meets needs.  Compliance/Adherence/Medication fill history: Care Gaps: Shingrix COVID-19 Vaccine  Star-Rating Drugs: Pravastatin 20 mg last filled on 03/27/2021 for 90 day supply at [facility].  Patient's preferred pharmacy is:  [facility] [address] [place] [address] Phone: [phone] Fax: [phone]  Uses pill box? Yes Pt endorses 100% compliance  We discussed: Current pharmacy is preferred with insurance plan and patient is satisfied with pharmacy services Patient decided to: Continue current medication management strategy  Care Plan and Follow Up Patient Decision:  Patient agrees to Care Plan and Follow-up.  Plan: Telephone follow up appointment with care management team member scheduled for:  09/05/2021 at 2:00 PM  Malva Limes, [facility] [phone]

## 2021-09-05 NOTE — Patient Instructions (Signed)
Visit Information It was great speaking with you today!  Please let me know if you have any questions about our visit.   Goals Addressed             This Visit's Progress    Track and Manage My Blood Pressure-Hypertension   On track    Timeframe:  Long-Range Goal Priority:  High Start Date:  04/13/2021                            Expected End Date:  04/13/2022                     Follow Up within 90 days    - check blood pressure weekly    Why is this important?   You won't feel high blood pressure, but it can still hurt your blood vessels.  High blood pressure can cause heart or kidney problems. It can also cause a stroke.  Making lifestyle changes like losing a little weight or eating less salt will help.  Checking your blood pressure at home and at different times of the day can help to control blood pressure.  If the doctor prescribes medicine remember to take it the way the doctor ordered.  Call the office if you cannot afford the medicine or if there are questions about it.     Notes:         Patient Care Plan: General Pharmacy (Adult)     Problem Identified: Hypertension, Hyperlipidemia, GERD, Osteopenia, Osteoarthritis, and Chronic Bronchitis, and Restless Legs Syndromes   Priority: High     Long-Range Goal: Patient-Specific Goal   Start Date: 04/13/2021  Expected End Date: 04/13/2022  This Visit's Progress: On track  Recent Progress: On track  Priority: High  Note:   Current Barriers:  No barriers noted  Pharmacist Clinical Goal(s):  Patient will maintain control of blood pressure as evidenced by BP less than 140/90  through collaboration with PharmD and provider.   Interventions: 1:1 collaboration with Alba Cory, MD regarding development and update of comprehensive plan of care as evidenced by provider attestation and co-signature Inter-disciplinary care team collaboration (see longitudinal plan of care) Comprehensive medication review performed;  medication list updated in electronic medical record  Hypertension (BP goal <140/90) -Controlled -Current treatment: Olmesartan-HCTZ 40-25 mg daily -Medications previously tried: NA  -Current home readings:   125/67 -Denies hypotensive/hypertensive symptoms -Counseled to monitor BP at home weekly, document, and provide log at future appointments -Recommended to continue current medication  Hyperlipidemia: (LDL goal < 100) -Controlled -Current treatment: Pravastatin 20 mg daily  -Medications previously tried: NA  -Recommended to continue current medication  COPD (Goal: control symptoms and prevent exacerbations) -Controlled -Current treatment  Ventolin HFA 2 puffs every 6 hours as needed Breo Ellipta 1 puff daily  Montelukast 10 mg nightly  -Current treatment  Levocetirizine 5 mg nightly  Fexofenadine 180 mg daily  -Medications previously tried: NA  -Exacerbations requiring treatment in last 6 months: None -Patient reports consistent use of maintenance inhaler -Frequency of rescue inhaler use: Infrequently -Counseled patient on importance of CPAP use.  -Recommended to continue current medication  GERD (Goal: Prevent heartburn) -Controlled -Current treatment  Omeprazole 40 mg daily  -Medications previously tried: NA -Patient with significant rebound heartburn, but interested in eventually retrying to discontinue medication. Discussed a slow taper of omeprazole and utilization of either Tums or Pepcid to help manage rebound symptoms.   -Recommended to  continue current medication   Patient Goals/Self-Care Activities Patient will:  - check blood pressure weekly, document, and provide at future appointments  Follow Up Plan: Telephone follow up appointment with care management team member scheduled for:  09/05/2021 at 2:00 PM    Patient agreed to services and verbal consent obtained.   Patient verbalizes understanding of instructions provided today and agrees to view in  MyChart.   Cheyenne Adas, CPP Clinical Pharmacist Practitioner  [facility] [phone]

## 2021-09-22 DIAGNOSIS — J449 Chronic obstructive pulmonary disease, unspecified: Secondary | ICD-10-CM | POA: Diagnosis not present

## 2021-09-22 DIAGNOSIS — I10 Essential (primary) hypertension: Secondary | ICD-10-CM

## 2021-09-22 DIAGNOSIS — E785 Hyperlipidemia, unspecified: Secondary | ICD-10-CM

## 2021-10-05 ENCOUNTER — Telehealth: Payer: Self-pay

## 2021-10-05 NOTE — Progress Notes (Signed)
Chronic Care Management Pharmacy Assistant   Name: Ruth [name]  MRN: [id number] DOB: [birth year]  Reason for Encounter:Hypertension Disease State Call.   Recent office visits:  No recent office visit.  Recent consult visits:  No recent Consult Visit  Hospital visits:  None in previous 6 months  Medications: Outpatient Encounter Medications as of 10/05/2021  Medication Sig   acetaminophen (TYLENOL) 500 MG tablet Take 1-2 tablets (500-1,000 mg total) by mouth every 8 (eight) hours as needed.   albuterol (VENTOLIN HFA) 108 (90 Base) MCG/ACT inhaler Inhale 2 puffs into the lungs every 6 (six) hours as needed for wheezing or shortness of breath.   Ascorbic Acid (VITAMIN C) 1000 MG tablet Take 1,000 mg by mouth.   BREO ELLIPTA 200-25 MCG/INH AEPB Take 1 puff by mouth daily.   Calcium-Vitamin D 600-200 MG-UNIT tablet Take 1 tablet by mouth 2 (two) times daily.    Cholecalciferol (VITAMIN D3) 1000 units CAPS Take by mouth.   Cod Liver Oil 1000 MG CAPS Take by mouth.   Coenzyme Q10 (COQ10) 200 MG CAPS Take 200 mg by mouth daily.   Cyanocobalamin (B-12 PO) Place 2,500 lozenges under the tongue daily.   Garlic 1000 MG CAPS Take 1,000 mg by mouth.    L-Lysine 500 MG TABS Take by mouth.   levocetirizine (XYZAL) 5 MG tablet Take 1 tablet (5 mg total) by mouth every evening.   magnesium oxide (MAG-OX) 400 MG tablet Take 500 mg by mouth daily.    montelukast (SINGULAIR) 10 MG tablet Take 1 tablet (10 mg total) by mouth at bedtime.   Nutritional Supplements (JOINT FORMULA PO) Take 2 tablets by mouth daily. glucosamine   olmesartan-hydrochlorothiazide (BENICAR HCT) 40-25 MG tablet Take 1 tablet by mouth once daily   omeprazole (PRILOSEC) 40 MG capsule Take 1 capsule (40 mg total) by mouth daily.   Potassium Gluconate 2.5 MEQ TABS Take 1 tablet by mouth. Pt taking 1 tab 99 mg daily   pravastatin (PRAVACHOL) 20 MG tablet Take 1 tablet (20 mg total) by mouth daily.   rOPINIRole (REQUIP) 0.5 MG  tablet Take 1 tablet (0.5 mg total) by mouth at bedtime.   Facility-Administered Encounter Medications as of 10/05/2021  Medication   cyanocobalamin ((VITAMIN B-12)) injection 1,000 mcg    Care Gaps: Shingrix Vaccine Covid Vaccine Star Rating Drugs: Pravastatin 20 mg last [facility]. Olmesartan-HCTZ 20-12.5 mg Last [facility]15/2021 [facility]. Medication Fill Gaps: None ID  Reviewed chart prior to disease state call. Spoke with patient regarding BP  Recent Office Vitals: BP Readings from Last 3 Encounters:  04/26/21 130/76  01/31/21 124/78  10/26/20 132/84   Pulse Readings from Last 3 Encounters:  04/26/21 83  01/31/21 74  10/26/20 90    Wt Readings from Last 3 Encounters:  04/26/21 143 lb (64.9 kg)  01/31/21 140 lb 3.2 oz (63.6 kg)  10/26/20 139 lb 1.6 oz (63.1 kg)     Kidney Function Lab Results  Component Value Date/Time   CREATININE 0.84 11/24/2020 09:39 AM   CREATININE 0.73 10/18/2019 10:27 AM   GFRNONAA 68 11/24/2020 09:39 AM   GFRAA 79 11/24/2020 09:39 AM    BMP Latest Ref Rng & Units 11/24/2020 10/18/2019 11/12/2018  Glucose 65 - 99 mg/dL 84 82 74  BUN 7 - 25 mg/dL 13 14 14   Creatinine 0.60 - 0.93 mg/dL 8.750.84 6.430.73 3.290.86  BUN/Creat Ratio 6 - 22 (calc) NOT APPLICABLE  NOT APPLICABLE NOT APPLICABLE  Sodium 135 - 146 mmol/L 142 141 142  Potassium 3.5 - 5.3 mmol/L 3.8 3.7 3.6  Chloride 98 - 110 mmol/L 105 105 102  CO2 20 - 32 mmol/L Calcium 8.6 - 10.4 mg/dL 78.2 95.6 21.3    Current antihypertensive regimen:  Olmesartan-HCTZ 40-25 mg daily How often are you checking your Blood Pressure? weekly Current home BP readings:  On 10/05/2021 it was 139/60.Patient states she has been busy all morning, but her blood pressure usually ranges around 120/136/70's. What recent interventions/DTPs have been made by any provider to improve Blood Pressure control since last CPP Visit: None ID Any recent  hospitalizations or ED visits since last visit with CPP? No What diet changes have been made to improve Blood Pressure Control?  Patient states she use [employer] seasoning instead of salt or pepper since it is low in sodium. Patient states she enjoys soup,eggs, and Dr.Pepper once in awhile. What exercise is being done to improve your Blood Pressure Control?  Patient states she does not exercise, but he is very active during the day.Patient states she wakes up at 8:30 am and goes to bed between 12:30 am -1:00 am.Patient states she does not take any naps unless she takes medication that can cause drowsiness.  Patient states she has realize around the recent holidays after she ate sweets that the sweets trigger neuropathy in her legs.Patient states she was unaware that sweets can cause neuropathy.Patient reports she is taking OTC medication for her discomfort, and prefers not to add any medications at this time.Patient states one of her provider wanted her to start a Zpack recently, but she decided not to.Patient states when she takes a Zpack it is hard for her to be active, and she needs to move around especially when her neuropathy is trigger.   Patient states she does not believe she has sleep apnea, and would like to be tested again in a facility instead at home. Patient reports when she was tested the first time it was at her home, and she did it herself during 2020 due to COVID.Patient states she was up and down all night, and the machine was only on for 2 in half hours.Patient reports she was not sure to if she was using the machine correctly.Patient states she would like be tested again to make sure she has it because "I know my body and I really do not think I have sleep apnea". Patient inform me she is not using her CPAP machine at this time because she does not think she has sleep apnea, and would like a second test to confirm.Patient states if this second test confirm she has sleep apnea she  will use the CPAP machine.  Notified Clinical pharmacist.  Per Clinical pharmacist, I will ask Dr. Carlynn Purl to put in a new referral for a sleep study.Inform patient to contact you if she hasn't heard anything regarding scheduling it in 2 weeks.  Patient verbalized understanding.  Adherence Review: Is the patient currently on ACE/ARB medication? Yes Does the patient have >5 day gap between last estimated fill dates? No   Telephone follow up appointment with Care management team member scheduled for : 02/27/2022 at 8:30 am.  Everlean Cherry Clinical Pharmacist Assistant [phone]

## 2021-10-10 ENCOUNTER — Other Ambulatory Visit: Payer: Self-pay | Admitting: Family Medicine

## 2021-10-10 DIAGNOSIS — G4733 Obstructive sleep apnea (adult) (pediatric): Secondary | ICD-10-CM

## 2021-10-26 NOTE — Progress Notes (Signed)
Name: Ruth [name]   MRN: [id number]    DOB: 1945-12-04   Date:10/29/2021       Progress Note  Subjective  Chief Complaint  Follow Up  HPI  OSA: she sees Dr. [place] Callas, she started CPAP machine in  May 2020, she stopped using it months ago because of RLS and neuropathy that kept her awake at night She will have a repeat in house study soon.    GERD: tried to stop omeprazole but unable to tolerated symptoms without medication, she has been unable to take wean down to every other day. She tried to follow a GERD appropriate diet   Polyneuropathy: seen by Dr. Sherryll Burger in the past , she states still has some nerve pains, but not as severe , she stopped taking   gabapentin and lyrica because she got drowsy . She has been taking Requip and drinking plenty of water and magnesium and seems to help with symptoms    HTN: bp is at goal, she has been off Norvasc 2.5 mg and is doing well on higher dose of Benicar HCTZ 40/25 mg daily . Denies chest pain, palpitation or sob.    Dyslipidemia:We started her on medications last year , last LDL was 48, we will recheck labs today      Asthma: under the care of Dr.  Callas, she states well controlled with medication, no wheezing or SOB but she has an occasional cough. She has Breo but not taking it daily, she has a dry cough, she was exposed to COVID-19 recently - 4 days ago, mild scratch throat but no other symptoms. We will check for COVID-19   B12 : currently on SL B12 , and doing well, fatigue has improved. She used to take B12 injections but level was high last year, we will recheck labs .   Senile purpura: reassurance given , stable   Patient Active Problem List   Diagnosis Date Noted   OSA on CPAP 04/15/2019   B12 deficiency 12/09/2018   Dyslipidemia 11/08/2017   Vitamin D deficiency 10/31/2016   Osteopenia of left hip 10/31/2016   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   BPV (benign positional vertigo) 04/17/2016   RLS (restless legs syndrome)  04/17/2016   GERD (gastroesophageal reflux disease) 03/11/2016   History of hyperparathyroidism 03/11/2016   History of gastric ulcer 03/11/2016   Essential hypertension 04/24/2015   Primary osteoarthritis involving multiple joints 04/24/2015   Lumbago 04/24/2015   Family history of colonic polyps     Past Surgical History:  Procedure Laterality Date   CARPAL TUNNEL RELEASE Bilateral 2000   CATARACT EXTRACTION W/PHACO Left 04/18/2020   Procedure: CATARACT EXTRACTION PHACO AND INTRAOCULAR LENS PLACEMENT (IOC) LEFT 4.78  00:32.8;  Surgeon: Galen Manila, MD;  [facility];  Service: Ophthalmology;  Laterality: Left;   CATARACT EXTRACTION W/PHACO Right 05/09/2020   Procedure: CATARACT EXTRACTION PHACO AND INTRAOCULAR LENS PLACEMENT (IOC) RIGHT 3.41 00:21.8;  Surgeon: Galen Manila, MD;  Location: [facility];  Service: Ophthalmology;  Laterality: Right;   COLONOSCOPY  06/17/2012   A few two mm ulcers were found in the sigmoid colon, no bleeding present,bx taken-path reporet on the few tiny ulcers ,non specific   COLONOSCOPY WITH PROPOFOL N/A 06/11/2017   Procedure: COLONOSCOPY WITH PROPOFOL;  Surgeon: Kieth Brightly, MD;  Location: [facility];  Service: Endoscopy;  Laterality: N/A;   parathyroid surgery  2008   TEMPOROMANDIBULAR JOINT ARTHROPLASTY Left 1984   1989,1990-bilateral   TUBAL LIGATION  Family History  Adopted: Yes  Problem Relation Age of Onset   Kidney disease Mother    Ovarian cancer Mother    Hypertension Father    Stroke Father    Lupus Sister    Breast cancer Maternal Grandmother    Breast cancer Cousin        pat cousins    Social History   Tobacco Use   Smoking status: Never   Smokeless tobacco: Never  Substance Use Topics   Alcohol use: Yes    Comment: wine     Current Outpatient Medications:    acetaminophen (TYLENOL) 500 MG tablet, Take 1-2 tablets (500-1,000 mg total) by mouth every 8 (eight) hours as  needed., Disp: 100 tablet, Rfl: 2   albuterol (VENTOLIN HFA) 108 (90 Base) MCG/ACT inhaler, Inhale 2 puffs into the lungs every 6 (six) hours as needed for wheezing or shortness of breath., Disp: 1 each, Rfl: 0   Ascorbic Acid (VITAMIN C) 1000 MG tablet, Take 1,000 mg by mouth., Disp: , Rfl:    BREO ELLIPTA 200-25 MCG/INH AEPB, Take 1 puff by mouth daily., Disp: , Rfl:    Calcium-Vitamin D 600-200 MG-UNIT tablet, Take 1 tablet by mouth 2 (two) times daily. , Disp: , Rfl:    Cholecalciferol (VITAMIN D3) 1000 units CAPS, Take by mouth., Disp: , Rfl:    Cod Liver Oil 1000 MG CAPS, Take by mouth., Disp: , Rfl:    Coenzyme Q10 (COQ10) 200 MG CAPS, Take 200 mg by mouth daily., Disp: , Rfl:    Cyanocobalamin (B-12 PO), Place 2,500 lozenges under the tongue daily., Disp: , Rfl:    Garlic 1000 MG CAPS, Take 1,000 mg by mouth. , Disp: , Rfl:    L-Lysine 500 MG TABS, Take by mouth., Disp: , Rfl:    levocetirizine (XYZAL) 5 MG tablet, Take 1 tablet (5 mg total) by mouth every evening., Disp: 90 tablet, Rfl: 0   magnesium oxide (MAG-OX) 400 MG tablet, Take 500 mg by mouth daily. , Disp: , Rfl:    Nutritional Supplements (JOINT FORMULA PO), Take 2 tablets by mouth daily. glucosamine, Disp: , Rfl:    Potassium Gluconate 2.5 MEQ TABS, Take 1 tablet by mouth. Pt taking 1 tab 99 mg daily, Disp: , Rfl:    montelukast (SINGULAIR) 10 MG tablet, Take 1 tablet (10 mg total) by mouth at bedtime., Disp: 90 tablet, Rfl: 1   olmesartan-hydrochlorothiazide (BENICAR HCT) 40-25 MG tablet, Take 1 tablet by mouth daily., Disp: 90 tablet, Rfl: 1   omeprazole (PRILOSEC) 40 MG capsule, Take 1 capsule (40 mg total) by mouth daily., Disp: 90 capsule, Rfl: 1   pravastatin (PRAVACHOL) 20 MG tablet, Take 1 tablet (20 mg total) by mouth daily., Disp: 90 tablet, Rfl: 1   rOPINIRole (REQUIP) 0.5 MG tablet, Take 1 tablet (0.5 mg total) by mouth at bedtime., Disp: 90 tablet, Rfl: 1  Allergies  Allergen Reactions   Ace Inhibitors Cough    Aspirin Nausea Only    Abdominal pain   Crestor [Rosuvastatin]     Muscle pain     I personally reviewed active problem list, medication list, allergies, family history, social history, health maintenance with the patient/caregiver today.   ROS  Constitutional: Negative for fever or weight change.  Respiratory: Negative for cough and shortness of breath.   Cardiovascular: Negative for chest pain or palpitations.  Gastrointestinal: Negative for abdominal pain, no bowel changes.  Musculoskeletal: Negative for gait problem or joint swelling.  Skin: Negative  for rash.  Neurological: Negative for dizziness or headache.  No other specific complaints in a complete review of systems (except as listed in HPI above).   Objective  Vitals:   10/29/21 0817  BP: 122/78  Pulse: 93  Resp: 16  SpO2: 98%  Weight: 143 lb (64.9 kg)  Height: 5' (1.524 m)    Body mass index is 27.93 kg/m.  Physical Exam  Constitutional: Patient appears well-developed and well-nourished. Overweight.  No distress.  HEENT: head atraumatic, normocephalic, pupils equal and reactive to ligh, neck supple Cardiovascular: Normal rate, regular rhythm and normal heart sounds.  No murmur heard. No BLE edema. Pulmonary/Chest: Effort normal and breath sounds normal. No respiratory distress. Abdominal: Soft.  There is no tenderness. Psychiatric: Patient has a normal mood and affect. behavior is normal. Judgment and thought content normal.   PHQ2/9: Depression screen [facility] 2/9 10/29/2021 04/26/2021 01/31/2021 11/30/2020 10/26/2020  Decreased Interest 0 0 0 0 0  Down, Depressed, Hopeless 0 0 0 0 0  PHQ - 2 Score 0 0 0 0 0  Altered sleeping 0 - 0 - -  Tired, decreased energy 0 - 0 - -  Change in appetite 0 - 0 - -  Feeling bad or failure about yourself  0 - 0 - -  Trouble concentrating 0 - 0 - -  Moving slowly or fidgety/restless 0 - 0 - -  Suicidal thoughts 0 - 0 - -  PHQ-9 Score 0 - 0 - -  Difficult doing work/chores - -  Not difficult at all - -  Some recent data might be hidden    phq 9 is negative   Fall Risk: Fall Risk  10/29/2021 04/26/2021 01/31/2021 11/30/2020 10/26/2020  Falls in the past year? 0 0 0 0 0  Number falls in past yr: 0 0 0 0 0  Injury with Fall? 0 0 0 0 0  Risk for fall due to : No Fall Risks - - No Fall Risks -  Follow up Falls prevention discussed - - Falls prevention discussed -      Functional Status Survey: Is the patient deaf or have difficulty hearing?: No Does the patient have difficulty seeing, even when wearing glasses/contacts?: No Does the patient have difficulty concentrating, remembering, or making decisions?: No Does the patient have difficulty walking or climbing stairs?: No Does the patient have difficulty dressing or bathing?: No Does the patient have difficulty doing errands alone such as visiting a doctor's office or shopping?: No    Assessment & Plan  1. Essential hypertension  - COMPLETE METABOLIC PANEL WITH GFR - CBC with Differential/Platelet - olmesartan-hydrochlorothiazide (BENICAR HCT) 40-25 MG tablet; Take 1 tablet by mouth daily.  Dispense: 90 tablet; Refill: 1  2. Gastroesophageal reflux disease without esophagitis  - omeprazole (PRILOSEC) 40 MG capsule; Take 1 capsule (40 mg total) by mouth daily.  Dispense: 90 capsule; Refill: 1  3. Senile purpura (HCC)  Reassurance given   4. Vitamin D deficiency  - VITAMIN D 25 Hydroxy (Vit-D Deficiency, Fractures)  5. RLS (restless legs syndrome)  - rOPINIRole (REQUIP) 0.5 MG tablet; Take 1 tablet (0.5 mg total) by mouth at bedtime.  Dispense: 90 tablet; Refill: 1  6. OSA on CPAP  She is trying to go back for sleep study   7. Dyslipidemia  - Lipid panel - pravastatin (PRAVACHOL) 20 MG tablet; Take 1 tablet (20 mg total) by mouth daily.  Dispense: 90 tablet; Refill: 1  8. B12 deficiency  - Vitamin  B12  9. Peripheral polyneuropathy   10. History of hyperparathyroidism   11. Exposure to  COVID-19 virus  - Novel Coronavirus, NAA ([facility])  12. Dry cough  - Novel Coronavirus, NAA ([facility])  13. Chronic allergic bronchitis  - montelukast (SINGULAIR) 10 MG tablet; Take 1 tablet (10 mg total) by mouth at bedtime.  Dispense: 90 tablet; Refill: 1

## 2021-10-29 ENCOUNTER — Ambulatory Visit (INDEPENDENT_AMBULATORY_CARE_PROVIDER_SITE_OTHER): Payer: Medicare HMO | Admitting: Family Medicine

## 2021-10-29 ENCOUNTER — Other Ambulatory Visit: Payer: Self-pay | Admitting: Family Medicine

## 2021-10-29 ENCOUNTER — Encounter: Payer: Self-pay | Admitting: Family Medicine

## 2021-10-29 VITALS — BP 122/78 | HR 93 | Resp 16 | Ht 60.0 in | Wt 143.0 lb

## 2021-10-29 DIAGNOSIS — D692 Other nonthrombocytopenic purpura: Secondary | ICD-10-CM | POA: Diagnosis not present

## 2021-10-29 DIAGNOSIS — E785 Hyperlipidemia, unspecified: Secondary | ICD-10-CM | POA: Diagnosis not present

## 2021-10-29 DIAGNOSIS — E538 Deficiency of other specified B group vitamins: Secondary | ICD-10-CM

## 2021-10-29 DIAGNOSIS — Z20822 Contact with and (suspected) exposure to covid-19: Secondary | ICD-10-CM | POA: Diagnosis not present

## 2021-10-29 DIAGNOSIS — K219 Gastro-esophageal reflux disease without esophagitis: Secondary | ICD-10-CM | POA: Diagnosis not present

## 2021-10-29 DIAGNOSIS — Z1231 Encounter for screening mammogram for malignant neoplasm of breast: Secondary | ICD-10-CM

## 2021-10-29 DIAGNOSIS — J45998 Other asthma: Secondary | ICD-10-CM

## 2021-10-29 DIAGNOSIS — Z8639 Personal history of other endocrine, nutritional and metabolic disease: Secondary | ICD-10-CM | POA: Diagnosis not present

## 2021-10-29 DIAGNOSIS — G629 Polyneuropathy, unspecified: Secondary | ICD-10-CM | POA: Diagnosis not present

## 2021-10-29 DIAGNOSIS — E559 Vitamin D deficiency, unspecified: Secondary | ICD-10-CM | POA: Diagnosis not present

## 2021-10-29 DIAGNOSIS — Z9989 Dependence on other enabling machines and devices: Secondary | ICD-10-CM

## 2021-10-29 DIAGNOSIS — R058 Other specified cough: Secondary | ICD-10-CM

## 2021-10-29 DIAGNOSIS — I10 Essential (primary) hypertension: Secondary | ICD-10-CM | POA: Diagnosis not present

## 2021-10-29 DIAGNOSIS — G4733 Obstructive sleep apnea (adult) (pediatric): Secondary | ICD-10-CM | POA: Diagnosis not present

## 2021-10-29 DIAGNOSIS — G2581 Restless legs syndrome: Secondary | ICD-10-CM

## 2021-10-29 LAB — CBC WITH DIFFERENTIAL/PLATELET
Absolute Monocytes: 608 cells/uL (ref 200–950)
Basophils Absolute: 19 cells/uL (ref 0–200)
Basophils Relative: 0.3 %
Eosinophils Absolute: 51 cells/uL (ref 15–500)
Eosinophils Relative: 0.8 %
HCT: 39.5 % (ref 35.0–45.0)
Hemoglobin: 13.4 g/dL (ref 11.7–15.5)
Lymphs Abs: 2195 cells/uL (ref 850–3900)
MCH: 31.2 pg (ref 27.0–33.0)
MCHC: 33.9 g/dL (ref 32.0–36.0)
MCV: 91.9 fL (ref 80.0–100.0)
MPV: 9.5 fL (ref 7.5–12.5)
Monocytes Relative: 9.5 %
Neutro Abs: 3526 cells/uL (ref 1500–7800)
Neutrophils Relative %: 55.1 %
Platelets: 277 10*3/uL (ref 140–400)
RBC: 4.3 10*6/uL (ref 3.80–5.10)
RDW: 11.3 % (ref 11.0–15.0)
Total Lymphocyte: 34.3 %
WBC: 6.4 10*3/uL (ref 3.8–10.8)

## 2021-10-29 LAB — COMPLETE METABOLIC PANEL WITH GFR
AG Ratio: 1.5 (calc) (ref 1.0–2.5)
ALT: 17 U/L (ref 6–29)
AST: 21 U/L (ref 10–35)
Albumin: 4.2 g/dL (ref 3.6–5.1)
Alkaline phosphatase (APISO): 51 U/L (ref 37–153)
BUN: 14 mg/dL (ref 7–25)
CO2: 32 mmol/L (ref 20–32)
Calcium: 10.1 mg/dL (ref 8.6–10.4)
Chloride: 103 mmol/L (ref 98–110)
Creat: 0.85 mg/dL (ref 0.60–1.00)
Globulin: 2.8 g/dL (calc) (ref 1.9–3.7)
Glucose, Bld: 88 mg/dL (ref 65–99)
Potassium: 4.1 mmol/L (ref 3.5–5.3)
Sodium: 143 mmol/L (ref 135–146)
Total Bilirubin: 0.4 mg/dL (ref 0.2–1.2)
Total Protein: 7 g/dL (ref 6.1–8.1)
eGFR: 72 mL/min/{1.73_m2} (ref >=60)

## 2021-10-29 LAB — LIPID PANEL
Cholesterol: 120 mg/dL (ref <200)
HDL: 46 mg/dL — ABNORMAL LOW (ref >=50)
LDL Cholesterol (Calc): 52 mg/dL (calc)
Non-HDL Cholesterol (Calc): 74 mg/dL (calc) (ref <130)
Total CHOL/HDL Ratio: 2.6 (calc) (ref <5.0)
Triglycerides: 136 mg/dL (ref <150)

## 2021-10-29 LAB — VITAMIN B12: Vitamin B-12: >2000 pg/mL — ABNORMAL HIGH (ref 200–1100)

## 2021-10-29 LAB — VITAMIN D 25 HYDROXY (VIT D DEFICIENCY, FRACTURES): Vit D, 25-Hydroxy: 43 ng/mL (ref 30–100)

## 2021-10-29 MED ORDER — PRAVASTATIN SODIUM 20 MG PO TABS: 20.0000 mg | ORAL_TABLET | Freq: Every day | ORAL | 1 refills | Status: DC

## 2021-10-29 MED ORDER — OLMESARTAN MEDOXOMIL-HCTZ 40-25 MG PO TABS: 1.0000 | ORAL_TABLET | Freq: Every day | ORAL | 1 refills | Status: DC

## 2021-10-29 MED ORDER — OMEPRAZOLE 40 MG PO CPDR: 40.0000 mg | DELAYED_RELEASE_CAPSULE | Freq: Every day | ORAL | 1 refills | Status: DC

## 2021-10-29 MED ORDER — ROPINIROLE HCL 0.5 MG PO TABS: 0.5000 mg | ORAL_TABLET | Freq: Every day | ORAL | 1 refills | Status: DC

## 2021-10-29 MED ORDER — MONTELUKAST SODIUM 10 MG PO TABS: 10.0000 mg | ORAL_TABLET | Freq: Every day | ORAL | 1 refills | Status: DC

## 2021-10-30 LAB — SPECIMEN STATUS REPORT

## 2021-10-30 LAB — NOVEL CORONAVIRUS, NAA: SARS-CoV-2, NAA: NOT DETECTED

## 2021-10-30 LAB — SARS-COV-2, NAA 2 DAY TAT

## 2021-11-22 DIAGNOSIS — J300 Vasomotor rhinitis: Secondary | ICD-10-CM | POA: Diagnosis not present

## 2021-11-22 DIAGNOSIS — K219 Gastro-esophageal reflux disease without esophagitis: Secondary | ICD-10-CM | POA: Diagnosis not present

## 2021-11-22 DIAGNOSIS — R059 Cough, unspecified: Secondary | ICD-10-CM | POA: Diagnosis not present

## 2021-12-04 ENCOUNTER — Telehealth: Payer: Self-pay

## 2021-12-04 ENCOUNTER — Ambulatory Visit (INDEPENDENT_AMBULATORY_CARE_PROVIDER_SITE_OTHER): Payer: Medicare HMO

## 2021-12-04 ENCOUNTER — Encounter: Payer: Self-pay | Admitting: Family Medicine

## 2021-12-04 VITALS — BP 118/66 | HR 81 | Temp 98.1°F | Resp 16 | Ht 60.0 in | Wt 144.5 lb

## 2021-12-04 DIAGNOSIS — Z780 Asymptomatic menopausal state: Secondary | ICD-10-CM

## 2021-12-04 DIAGNOSIS — Z0000 Encounter for general adult medical examination without abnormal findings: Secondary | ICD-10-CM | POA: Diagnosis not present

## 2021-12-04 NOTE — Patient Instructions (Signed)
Ruth [name] , ?Thank you for taking time to come for your Medicare Wellness Visit. I appreciate your ongoing commitment to your health goals. Please review the following plan we discussed and let me know if I can assist you in the future.  ? ?Screening recommendations/referrals: ?Colonoscopy: done 06/11/17. Repeat 05/2027 ?Mammogram: done 07/10/20; scheduled for 12/25/21 ?Bone Density: done 06/01/19. Please call [phone] to schedule your bone density screening.  ?Recommended yearly ophthalmology/optometry visit for glaucoma screening and checkup ?Recommended yearly dental visit for hygiene and checkup ? ?Vaccinations: ?Influenza vaccine: done 06/04/21 ?Pneumococcal vaccine: done 12/09/13 ?Tdap vaccine: done 04/13/12 ?Shingles vaccine: done 06/04/21  & 11/29/21   ?Covid-19:done 10/19/19, 11/16/19, 07/26/20 & 01/06/21 ? ?Advanced directives: Please bring a copy of your health care power of attorney and living will to the office at your convenience once you have completed that paperwork. ? ?Conditions/risks identified: Recommend increasing physical activity  ? ?Next appointment: Follow up in one year for your annual wellness visit  ? ? ?Preventive Care 40 Years and Older, Female ?Preventive care refers to lifestyle choices and visits with your health care provider that can promote health and wellness. ?What does preventive care include? ?A yearly physical exam. This is also called an annual well check. ?Dental exams once or twice a year. ?Routine eye exams. Ask your health care provider how often you should have your eyes checked. ?Personal lifestyle choices, including: ?Daily care of your teeth and gums. ?Regular physical activity. ?Eating a healthy diet. ?Avoiding tobacco and drug use. ?Limiting alcohol use. ?Practicing safe sex. ?Taking low-dose aspirin every day. ?Taking vitamin and mineral supplements as recommended by your health care provider. ?What happens during an annual well check? ?The services and screenings done by  your health care provider during your annual well check will depend on your age, overall health, lifestyle risk factors, and family history of disease. ?Counseling  ?Your health care provider may ask you questions about your: ?Alcohol use. ?Tobacco use. ?Drug use. ?Emotional well-being. ?Home and relationship well-being. ?Sexual activity. ?Eating habits. ?History of falls. ?Memory and ability to understand (cognition). ?Work and work Astronomer. ?Reproductive health. ?Screening  ?You may have the following tests or measurements: ?Height, weight, and BMI. ?Blood pressure. ?Lipid and cholesterol levels. These may be checked every 5 years, or more frequently if you are over 73 years old. ?Skin check. ?Lung cancer screening. You may have this screening every year starting at age 31 if you have a 30-pack-year history of smoking and currently smoke or have quit within the past 15 years. ?Fecal occult blood test (FOBT) of the stool. You may have this test every year starting at age 43. ?Flexible sigmoidoscopy or colonoscopy. You may have a sigmoidoscopy every 5 years or a colonoscopy every 10 years starting at age 88. ?Hepatitis C blood test. ?Hepatitis B blood test. ?Sexually transmitted disease (STD) testing. ?Diabetes screening. This is done by checking your blood sugar (glucose) after you have not eaten for a while (fasting). You may have this done every 1-3 years. ?Bone density scan. This is done to screen for osteoporosis. You may have this done starting at age 66. ?Mammogram. This may be done every 1-2 years. Talk to your health care provider about how often you should have regular mammograms. ?Talk with your health care provider about your test results, treatment options, and if necessary, the need for more tests. ?Vaccines  ?Your health care provider may recommend certain vaccines, such as: ?Influenza vaccine. This is recommended every year. ?Tetanus, diphtheria,  and acellular pertussis (Tdap, Td) vaccine. You  may need a Td booster every 10 years. ?Zoster vaccine. You may need this after age 36. ?Pneumococcal 13-valent conjugate (PCV13) vaccine. One dose is recommended after age 68. ?Pneumococcal polysaccharide (PPSV23) vaccine. One dose is recommended after age 4. ?Talk to your health care provider about which screenings and vaccines you need and how often you need them. ?This information is not intended to replace advice given to you by your health care provider. Make sure you discuss any questions you have with your health care provider. ?Document Released: 10/06/2015 Document Revised: 05/29/2016 Document Reviewed: 07/11/2015 ?Elsevier Interactive Patient Education ? 2017 [employer]. ? ?Fall Prevention in the Home ?Falls can cause injuries. They can happen to people of all ages. There are many things you can do to make your home safe and to help prevent falls. ?What can I do on the outside of my home? ?Regularly fix the edges of walkways and driveways and fix any cracks. ?Remove anything that might make you trip as you walk through a door, such as a raised step or threshold. ?Trim any bushes or trees on the path to your home. ?Use bright outdoor lighting. ?Clear any walking paths of anything that might make someone trip, such as rocks or tools. ?Regularly check to see if handrails are loose or broken. Make sure that both sides of any steps have handrails. ?Any raised decks and porches should have guardrails on the edges. ?Have any leaves, snow, or ice cleared regularly. ?Use sand or salt on walking paths during winter. ?Clean up any spills in your garage right away. This includes oil or grease spills. ?What can I do in the bathroom? ?Use night lights. ?Install grab bars by the toilet and in the tub and shower. Do not use towel bars as grab bars. ?Use non-skid mats or decals in the tub or shower. ?If you need to sit down in the shower, use a plastic, non-slip stool. ?Keep the floor dry. Clean up any water that spills  on the floor as soon as it happens. ?Remove soap buildup in the tub or shower regularly. ?Attach bath mats securely with double-sided non-slip rug tape. ?Do not have throw rugs and other things on the floor that can make you trip. ?What can I do in the bedroom? ?Use night lights. ?Make sure that you have a light by your bed that is easy to reach. ?Do not use any sheets or blankets that are too big for your bed. They should not hang down onto the floor. ?Have a firm chair that has side arms. You can use this for support while you get dressed. ?Do not have throw rugs and other things on the floor that can make you trip. ?What can I do in the kitchen? ?Clean up any spills right away. ?Avoid walking on wet floors. ?Keep items that you use a lot in easy-to-reach places. ?If you need to reach something above you, use a strong step stool that has a grab bar. ?Keep electrical cords out of the way. ?Do not use floor polish or wax that makes floors slippery. If you must use wax, use non-skid floor wax. ?Do not have throw rugs and other things on the floor that can make you trip. ?What can I do with my stairs? ?Do not leave any items on the stairs. ?Make sure that there are handrails on both sides of the stairs and use them. Fix handrails that are broken or loose. Make sure  that handrails are as long as the stairways. ?Check any carpeting to make sure that it is firmly attached to the stairs. Fix any carpet that is loose or worn. ?Avoid having throw rugs at the top or bottom of the stairs. If you do have throw rugs, attach them to the floor with carpet tape. ?Make sure that you have a light switch at the top of the stairs and the bottom of the stairs. If you do not have them, ask someone to add them for you. ?What else can I do to help prevent falls? ?Wear shoes that: ?Do not have high heels. ?Have rubber bottoms. ?Are comfortable and fit you well. ?Are closed at the toe. Do not wear sandals. ?If you use a stepladder: ?Make  sure that it is fully opened. Do not climb a closed stepladder. ?Make sure that both sides of the stepladder are locked into place. ?Ask someone to hold it for you, if possible. ?Clearly mark and make sure

## 2021-12-04 NOTE — Telephone Encounter (Signed)
Pt has had sleep study. Patient states she stopped using CPAP due to neuropathy. Nothing further needed.  ?

## 2021-12-04 NOTE — Progress Notes (Signed)
? ?Subjective:  ? Ruth [name] is a 76 y.o. female who presents for Medicare Annual (Subsequent) preventive examination. ? ?Review of Systems    ? ?Cardiac Risk Factors include: advanced age (>34men, >75 women);hypertension;dyslipidemia ? ?   ?Objective:  ?  ?Today's Vitals  ? 12/04/21 0927  ?BP: 118/66  ?Pulse: 81  ?Resp: 16  ?Temp: 98.1 ?F (36.7 ?C)  ?TempSrc: Oral  ?SpO2: 98%  ?Weight: 144 lb 8 oz (65.5 kg)  ?Height: 5' (1.524 m)  ? ?Body mass index is 28.22 kg/m?. ? ?Advanced Directives 12/04/2021 11/30/2020 05/09/2020 04/18/2020 11/25/2019 11/19/2018 06/23/2017  ?Does Patient Have a Medical Advance Directive? No No No No No No Yes  ?Type of Advance Directive - - - - - - -  ?Does patient want to make changes to medical advance directive? No - Patient declined - - - - - -  ?Copy of [place] in Chart? - - - - - - -  ?Would patient like information on creating a medical advance directive? - Yes (MAU/Ambulatory/Procedural Areas - Information given) No - Patient declined Yes (MAU/Ambulatory/Procedural Areas - Information given) Yes (MAU/Ambulatory/Procedural Areas - Information given) Yes (MAU/Ambulatory/Procedural Areas - Information given) -  ? ? ?Current Medications (verified) ?Outpatient Encounter Medications as of 12/04/2021  ?Medication Sig  ? acetaminophen (TYLENOL) 500 MG tablet Take 1-2 tablets (500-1,000 mg total) by mouth every 8 (eight) hours as needed.  ? albuterol (VENTOLIN HFA) 108 (90 Base) MCG/ACT inhaler Inhale 2 puffs into the lungs every 6 (six) hours as needed for wheezing or shortness of breath.  ? Ascorbic Acid (VITAMIN C) 1000 MG tablet Take 1,000 mg by mouth.  ? BREO ELLIPTA 200-25 MCG/INH AEPB Take 1 puff by mouth daily. Pt uses 3 times per week on average  ? Calcium-Vitamin D 600-200 MG-UNIT tablet Take 1 tablet by mouth 2 (two) times daily.   ? Cholecalciferol (VITAMIN D3) 1000 units CAPS Take by mouth.  ? Cod Liver Oil 1000 MG CAPS Take by mouth.  ? Coenzyme Q10 (COQ10) 200  MG CAPS Take 200 mg by mouth daily.  ? Garlic [id number] MG CAPS Take 1,000 mg by mouth.   ? L-Lysine 500 MG TABS Take by mouth.  ? levocetirizine (XYZAL) 5 MG tablet Take 1 tablet (5 mg total) by mouth every evening.  ? magnesium oxide (MAG-OX) 400 MG tablet Take 500 mg by mouth daily.   ? montelukast (SINGULAIR) 10 MG tablet Take 1 tablet (10 mg total) by mouth at bedtime.  ? Nutritional Supplements (JOINT FORMULA PO) Take 2 tablets by mouth daily. glucosamine  ? olmesartan-hydrochlorothiazide (BENICAR HCT) 40-25 MG tablet Take 1 tablet by mouth daily.  ? omeprazole (PRILOSEC) 40 MG capsule Take 1 capsule (40 mg total) by mouth daily.  ? Potassium Gluconate 2.5 MEQ TABS Take 1 tablet by mouth. Pt taking 1 tab 99 mg daily  ? pravastatin (PRAVACHOL) 20 MG tablet Take 1 tablet (20 mg total) by mouth daily.  ? rOPINIRole (REQUIP) 0.5 MG tablet Take 1 tablet (0.5 mg total) by mouth at bedtime.  ? [DISCONTINUED] Cyanocobalamin (B-12 PO) Place 2,500 lozenges under the tongue daily.  ? [DISCONTINUED] fexofenadine (ALLEGRA) 180 MG tablet Take 1 tablet by mouth daily.  ? ?No facility-administered encounter medications on file as of 12/04/2021.  ? ? ?Allergies (verified) ?Ace inhibitors, Aspirin, and Crestor [rosuvastatin]  ? ?History: ?Past Medical History:  ?Diagnosis Date  ? Allergy   ? mycins; cause diarrhea  ? Arthritis   ?  since 2007  ? Asthma   ? Breast screening, unspecified   ? Family history of colonic polyps   ? GERD (gastroesophageal reflux disease)   ? History of cystitis 1987  ? Hypertension   ? since 2005  ? Neuropathy   ? feet  ? 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 surgery  ? Restless leg syndrome   ? Scoliosis   ? Sleep apnea   ? CPAP  ? Ulcer 1966  ? ?Past Surgical History:  ?Procedure Laterality Date  ? CARPAL TUNNEL RELEASE Bilateral 2000  ? CATARACT EXTRACTION W/PHACO Left 04/18/2020  ? Procedure: CATARACT EXTRACTION PHACO AND INTRAOCULAR LENS PLACEMENT (IOC) LEFT 4.78  00:32.8;  Surgeon: Birder Robson, MD;   Location: [place];  Service: Ophthalmology;  Laterality: Left;  ? CATARACT EXTRACTION W/PHACO Right 05/09/2020  ? Procedure: CATARACT EXTRACTION PHACO AND INTRAOCULAR LENS PLACEMENT (IOC) RIGHT 3.41 00:21.8;  Surgeon: Birder Robson, MD;  Location: [place];  Service: Ophthalmology;  Laterality: Right;  ? COLONOSCOPY  06/17/2012  ? A few two mm ulcers were found in the sigmoid colon, no bleeding present,bx taken-path reporet on the few tiny ulcers ,non specific  ? COLONOSCOPY WITH PROPOFOL N/A 06/11/2017  ? Procedure: COLONOSCOPY WITH PROPOFOL;  Surgeon: Christene Lye, MD;  Location[facility] ENDOSCOPY;  Service: Endoscopy;  Laterality: N/A;  ? parathyroid surgery  2008  ? TEMPOROMANDIBULAR JOINT ARTHROPLASTY Left 1984  ? 1989,1990-bilateral  ? TUBAL LIGATION    ? ?Family History  ?Adopted: Yes  ?Problem Relation Age of Onset  ? Kidney disease Mother   ? Ovarian cancer Mother   ? Hypertension Father   ? Stroke Father   ? Lupus Sister   ? Breast cancer Maternal Grandmother   ? Breast cancer Cousin   ?     pat cousins  ? ?Social History  ? ?Socioeconomic History  ? Marital status: Married  ?  Spouse name: Shaelie Sanjurjo   ? Number of children: 1  ? Years of education: Not on file  ? Highest education level: Some college, no degree  ?Occupational History  ? Not on file  ?Tobacco Use  ? Smoking status: Never  ? Smokeless tobacco: Never  ?Vaping Use  ? Vaping Use: Never used  ?Substance and Sexual Activity  ? Alcohol use: Yes  ?  Comment: wine  ? Drug use: No  ? Sexual activity: Yes  ?  Partners: Male  ?  Birth control/protection: Post-menopausal  ?Other Topics Concern  ? Not on file  ?Social History Narrative  ? Married, retired, involved in church and has one grown son   ? ?Social Determinants of Health  ? ?Financial Resource Strain: Low Risk   ? Difficulty of Paying Living Expenses: Not hard at all  ?Food Insecurity: No Food Insecurity  ? Worried About Charity fundraiser in the Last  Year: Never true  ? Ran Out of Food in the Last Year: Never true  ?Transportation Needs: No Transportation Needs  ? Lack of Transportation (Medical): No  ? Lack of Transportation (Non-Medical): No  ?Physical Activity: Inactive  ? Days of Exercise per Week: 0 days  ? Minutes of Exercise per Session: 0 min  ?Stress: No Stress Concern Present  ? Feeling of Stress : Only a little  ?Social Connections: Socially Integrated  ? Frequency of Communication with Friends and Family: More than three times a week  ? Frequency of Social Gatherings with Friends and Family: More than three times a  week  ? Attends Religious Services: More than 4 times per year  ? Active Member of Clubs or Organizations: Yes  ? Attends Archivist Meetings: More than 4 times per year  ? Marital Status: Married  ? ? ?Tobacco Counseling ?Counseling given: Not Answered ? ? ?Clinical Intake: ? ?Pre-visit preparation completed: Yes ? ?Pain : No/denies pain ? ?  ? ?BMI - recorded: 28.22 ?Nutritional Status: BMI 25 -29 Overweight ?Nutritional Risks: None ?Diabetes: No ? ?How often do you need to have someone help you when you read instructions, pamphlets, or other written materials from your doctor or pharmacy?: 1 - Never ? ? ? ?Interpreter Needed?: No ? ?Information entered by :: Clemetine Marker LPN ? ? ?Activities of Daily Living ?In your present state of health, do you have any difficulty performing the following activities: 12/04/2021 10/29/2021  ?Hearing? N N  ?Vision? N N  ?Difficulty concentrating or making decisions? N N  ?Walking or climbing stairs? N N  ?Dressing or bathing? N N  ?Doing errands, shopping? N N  ?Preparing Food and eating ? N -  ?Using the Toilet? N -  ?In the past six months, have you accidently leaked urine? N -  ?Do you have problems with loss of bowel control? N -  ?Managing your Medications? N -  ?Managing your Finances? N -  ?Housekeeping or managing your Housekeeping? N -  ?Some recent data might be hidden  ? ? ?Patient Care  Team: ?Steele Sizer, MD as PCP - General (Family Medicine) ?Mosetta Anis, MD as Referring Physician (Allergy) ?Vladimir Crofts, MD as Consulting Physician (Neurology) ?Germaine Pomfret, [facility] as Ph

## 2021-12-05 ENCOUNTER — Ambulatory Visit (INDEPENDENT_AMBULATORY_CARE_PROVIDER_SITE_OTHER): Payer: Medicare HMO | Admitting: Primary Care

## 2021-12-05 ENCOUNTER — Other Ambulatory Visit: Payer: Self-pay

## 2021-12-05 ENCOUNTER — Encounter: Payer: Self-pay | Admitting: Primary Care

## 2021-12-05 VITALS — BP 110/80 | HR 91 | Temp 97.1°F | Ht 60.0 in | Wt 145.2 lb

## 2021-12-05 DIAGNOSIS — G4733 Obstructive sleep apnea (adult) (pediatric): Secondary | ICD-10-CM | POA: Diagnosis not present

## 2021-12-05 DIAGNOSIS — R0683 Snoring: Secondary | ICD-10-CM | POA: Diagnosis not present

## 2021-12-05 DIAGNOSIS — G2581 Restless legs syndrome: Secondary | ICD-10-CM | POA: Diagnosis not present

## 2021-12-05 DIAGNOSIS — Z9989 Dependence on other enabling machines and devices: Secondary | ICD-10-CM

## 2021-12-05 NOTE — Progress Notes (Signed)
? ?@Patient  ID: Ruth [name], female    DOB: 05-07-1946, 76 y.o.   MRN: [id number] ? ?Chief Complaint  ?Patient presents with  ? Consult  ? ? ?Referring provider: ?Alba CorySowles, Krichna, MD ? ?HPI: ?76 year old female, never smoked.  Past medical history significant for hypertension, chronic allergic bronchitis, OSA, GERD. ? ?12/05/2021 ?Patient presents today for sleep consult. She has a history of sleep apnea, she found it difficult to use CPAP d/t her neuropathy. She has not used CPAP in over a year. She does report seeing improvement in RLS/ neuropathy when she was using her CPAP.  She takes requip 0.5mg  at bedtime. She typically wakes up 3-4 times a night. She has noticed an improvement in her snoring when sleeping on her side. She avoids sweets prior to bedtime. Denies daytime sleepiness, narcolepsy, cataplexy, sleep walking.  ? ?Sleep questionnaire ?Symptoms- Snoring, restless sleep ?[facility] sleep study- [facility] ?Bedtime- 12-1am ?Time to fall asleep- 5-10 mins ?Nocturnal awakenings- several times  ?Out of bed/start of day- 7am ?Weight changes- 4-6 lbs ?Epworth- 2 ? ?Allergies  ?Allergen Reactions  ? Ace Inhibitors Cough  ? Aspirin Nausea Only  ?  Abdominal pain  ? Crestor [Rosuvastatin]   ?  Muscle pain   ? ? ?Immunization History  ?Administered Date(s) Administered  ? Fluad Quad(high Dose 65+) 05/25/2020, 06/04/2021  ? Influenza, High Dose Seasonal PF 06/05/2015, 06/18/2016, 06/17/2017, 06/09/2018, 06/16/2019  ? Influenza-Unspecified 06/05/2015  ? Moderna Sars-Covid-2 Vaccination 10/19/2019, 11/16/2019, 07/26/2020, 01/06/2021  ? Pneumococcal Conjugate-13 12/09/2013  ? Pneumococcal Polysaccharide-23 04/13/2012  ? Tdap 04/13/2012  ? Zoster Recombinat (Shingrix) 06/04/2021, 11/29/2021  ? Zoster, Live 07/24/2013  ? ? ?Past Medical History:  ?Diagnosis Date  ? Allergy   ? mycins; cause diarrhea  ? Arthritis   ? since 2007  ? Asthma   ? Breast screening, unspecified   ? Family history of colonic  polyps   ? GERD (gastroesophageal reflux disease)   ? History of cystitis 1987  ? Hypertension   ? since 2005  ? Neuropathy   ? feet  ? Parathyroid disorder [facility] 2008  ? surgery  ? Restless leg syndrome   ? Scoliosis   ? Sleep apnea   ? CPAP  ? Ulcer 1966  ? ? ?Tobacco History: ?Social History  ? ?Tobacco Use  ?Smoking Status Never  ?Smokeless Tobacco Never  ? ?Counseling given: Not Answered ? ? ?Outpatient Medications Prior to Visit  ?Medication Sig Dispense Refill  ? acetaminophen (TYLENOL) 500 MG tablet Take 1-2 tablets (500-1,000 mg total) by mouth every 8 (eight) hours as needed. 100 tablet 2  ? albuterol (VENTOLIN HFA) 108 (90 Base) MCG/ACT inhaler Inhale 2 puffs into the lungs every 6 (six) hours as needed for wheezing or shortness of breath. 1 each 0  ? Ascorbic Acid (VITAMIN C) 1000 MG tablet Take 1,000 mg by mouth.    ? BREO ELLIPTA 200-25 MCG/INH AEPB Take 1 puff by mouth daily. Pt uses 3 times per week on average    ? Calcium-Vitamin D 600-200 MG-UNIT tablet Take 1 tablet by mouth 2 (two) times daily.     ? Cholecalciferol (VITAMIN D3) 1000 units CAPS Take by mouth.    ? Cod Liver Oil 1000 MG CAPS Take by mouth.    ? Coenzyme Q10 (COQ10) 200 MG CAPS Take 200 mg by mouth daily.    ? Garlic 1000 MG CAPS Take 1,000 mg by mouth.     ? L-Lysine 500 MG TABS Take  by mouth.    ? levocetirizine (XYZAL) 5 MG tablet Take 1 tablet (5 mg total) by mouth every evening. 90 tablet 0  ? magnesium oxide (MAG-OX) 400 MG tablet Take 500 mg by mouth daily.     ? montelukast (SINGULAIR) 10 MG tablet Take 1 tablet (10 mg total) by mouth at bedtime. 90 tablet 1  ? Nutritional Supplements (JOINT FORMULA PO) Take 2 tablets by mouth daily. glucosamine    ? olmesartan-hydrochlorothiazide (BENICAR HCT) 40-25 MG tablet Take 1 tablet by mouth daily. 90 tablet 1  ? omeprazole (PRILOSEC) 40 MG capsule Take 1 capsule (40 mg total) by mouth daily. 90 capsule 1  ? Potassium Gluconate 2.5 MEQ TABS Take 1 tablet by mouth. Pt taking 1 tab 99  mg daily    ? pravastatin (PRAVACHOL) 20 MG tablet Take 1 tablet (20 mg total) by mouth daily. 90 tablet 1  ? rOPINIRole (REQUIP) 0.5 MG tablet Take 1 tablet (0.5 mg total) by mouth at bedtime. 90 tablet 1  ? ?No facility-administered medications prior to visit.  ? ? ?Review of Systems ? ?Review of Systems  ?Constitutional:  Negative for fatigue.  ?HENT: Negative.    ?Respiratory: Negative.    ?Psychiatric/Behavioral:  Positive for sleep disturbance.   ? ? ?Physical Exam ? ?BP 110/80 (BP Location: Left Arm, Patient Position: Sitting, Cuff Size: Normal)   Pulse 91   Temp (!) 97.1 ?F (36.2 ?C) (Oral)   Ht 5' (1.524 m)   Wt 145 lb 3.2 oz (65.9 kg)   SpO2 98%   BMI 28.36 kg/m?  ?Physical Exam ?Constitutional:   ?   Appearance: Normal appearance.  ?HENT:  ?   Head: Normocephalic and atraumatic.  ?   Mouth/Throat:  ?   Mouth: Mucous membranes are moist.  ?   Pharynx: Oropharynx is clear.  ?Cardiovascular:  ?   Rate and Rhythm: Normal rate and regular rhythm.  ?Pulmonary:  ?   Effort: Pulmonary effort is normal.  ?   Breath sounds: Normal breath sounds.  ?Musculoskeletal:     ?   General: Normal range of motion.  ?Skin: ?   General: Skin is warm and dry.  ?Neurological:  ?   General: No focal deficit present.  ?   Mental Status: She is alert and oriented to person, place, and time. Mental status is at baseline.  ?Psychiatric:     ?   Mood and Affect: Mood normal.     ?   Behavior: Behavior normal.     ?   Thought Content: Thought content normal.     ?   Judgment: Judgment normal.  ?  ? ?Lab Results: ? ?CBC ?   ?Component Value Date/Time  ? WBC 6.4 10/29/2021 0848  ? RBC 4.30 10/29/2021 0848  ? HGB 13.4 10/29/2021 0848  ? HCT 39.5 10/29/2021 0848  ? PLT 277 10/29/2021 0848  ? MCV 91.9 10/29/2021 0848  ? MCH 31.2 10/29/2021 0848  ? MCHC 33.9 10/29/2021 0848  ? RDW 11.3 10/29/2021 0848  ? LYMPHSABS 2,195 10/29/2021 0848  ? MONOABS 747 10/31/2016 0926  ? EOSABS 51 10/29/2021 0848  ? BASOSABS 19 10/29/2021 0848   ? ? ?BMET ?   ?Component Value Date/Time  ? NA 143 10/29/2021 0848  ? NA 141 06/06/2015 0834  ? K 4.1 10/29/2021 0848  ? CL 103 10/29/2021 0848  ? CO2 32 10/29/2021 0848  ? GLUCOSE 88 10/29/2021 0848  ? BUN 14 10/29/2021 0848  ? BUN  13 06/06/2015 0834  ? CREATININE 0.85 10/29/2021 0848  ? CALCIUM 10.1 10/29/2021 0848  ? GFRNONAA 68 11/24/2020 0939  ? GFRAA 79 11/24/2020 0939  ? ? ?BNP ?No results found for: BNP ? ?ProBNP ?No results found for: PROBNP ? ?Imaging: ?No results found. ? ? ?Assessment & Plan:  ? ?OSA on CPAP ?Patient has symptoms of loud snoring, restless sleep. Epworth 2. Hx sleep apnea and restless leg syndrome. Previously on CPAP. Needs in-lab split night sleep study. Discussed risk of untreated sleep apnea including cardiac arrhythmias, pulm HTN, stroke, DM. We briefly reviewed treatment options. Encouraged patient to work on weight loss efforts and focus on side sleeping position/elevate head of bed. Advised against driving if experiencing excessive daytime sleepiness. Follow-up in 4-6 weeks to review sleep study results and discuss treatment options further. ?  ? ? ?Glenford Bayley, NP ?12/11/2021 ? ?

## 2021-12-05 NOTE — Patient Instructions (Addendum)
Recommendations: ?- Take ROPINIROLE 2 tablets (total ) at bedtime  ? ?Orders: ?- Split night sleep study re: OSA/ restless leg  ? ?Follow-up: ?4-6 weeks with Beth or APP in office  ? ? ?Sleep Apnea ?Sleep apnea affects breathing during sleep. It causes breathing to stop for 10 seconds or more, or to become shallow. People with sleep apnea usually snore loudly. ?It can also increase the risk of: ?Heart attack. ?Stroke. ?Being very overweight (obese). ?Diabetes. ?Heart failure. ?Irregular heartbeat. ?High blood pressure. ?The goal of treatment is to help you breathe normally again. ?What are the causes? ?The most common cause of this condition is a collapsed or blocked airway. ?There are three kinds of sleep apnea: ?Obstructive sleep apnea. This is caused by a blocked or collapsed airway. ?Central sleep apnea. This happens when the brain does not send the right signals to the muscles that control breathing. ?Mixed sleep apnea. This is a combination of obstructive and central sleep apnea. ?What increases the risk? ?Being overweight. ?Smoking. ?Having a small airway. ?Being older. ?Being female. ?Drinking alcohol. ?Taking medicines to calm yourself (sedatives or tranquilizers). ?Having family members with the condition. ?Having a tongue or tonsils that are larger than normal. ?What are the signs or symptoms? ?Trouble staying asleep. ?Loud snoring. ?Headaches in the morning. ?Waking up gasping. ?Dry mouth or sore throat in the morning. ?Being sleepy or tired during the day. ?If you are sleepy or tired during the day, you may also: ?Not be able to focus your mind (concentrate). ?Forget things. ?Get angry a lot and have mood swings. ?Feel sad (depressed). ?Have changes in your personality. ?Have less interest in sex, if you are female. ?Be unable to have an erection, if you are female. ?How is this treated? ? ?Sleeping on your side. ?Using a medicine to get rid of mucus in your nose (decongestant). ?Avoiding the use of  alcohol, medicines to help you relax, or certain pain medicines (narcotics). ?Losing weight, if needed. ?Changing your diet. ?Quitting smoking. ?Using a machine to open your airway while you sleep, such as: ?An oral appliance. This is a mouthpiece that shifts your lower jaw forward. ?A CPAP device. This device blows air through a mask when you breathe out (exhale). ?An EPAP device. This has valves that you put in each nostril. ?A BIPAP device. This device blows air through a mask when you breathe in (inhale) and breathe out. ?Having surgery if other treatments do not work. ?Follow these instructions at home: ?Lifestyle ?Make changes that your doctor recommends. ?Eat a healthy diet. ?Lose weight if needed. ?Avoid alcohol, medicines to help you relax, and some pain medicines. ?Do not smoke or use any products that contain nicotine or tobacco. If you need help quitting, ask your doctor. ?General instructions ?Take over-the-counter and prescription medicines only as told by your doctor. ?If you were given a machine to use while you sleep, use it only as told by your doctor. ?If you are having surgery, make sure to tell your doctor you have sleep apnea. You may need to bring your device with you. ?Keep all follow-up visits. ?Contact a doctor if: ?The machine that you were given to use during sleep bothers you or does not seem to be working. ?You do not get better. ?You get worse. ?Get help right away if: ?Your chest hurts. ?You have trouble breathing in enough air. ?You have an uncomfortable feeling in your back, arms, or stomach. ?You have trouble talking. ?One side of your body feels  weak. ?A part of your face is hanging down. ?These symptoms may be an emergency. Get help right away. Call your local emergency services (911 in the [place] ?Do not wait to see if the symptoms will go away. ?Do not drive yourself to the hospital. ?Summary ?This condition affects breathing during sleep. ?The most common cause is a collapsed  or blocked airway. ?The goal of treatment is to help you breathe normally while you sleep. ?This information is not intended to replace advice given to you by your health care provider. Make sure you discuss any questions you have with your health care provider. ?Document Revised: 04/18/2021 Document Reviewed: 08/18/2020 ?Elsevier Patient Education ? 2022 [employer] ? ? ?Peripheral Neuropathy ?Peripheral neuropathy is a type of nerve damage. It affects nerves that carry signals between the spinal cord and the arms, legs, and the rest of the body (peripheral nerves). It does not affect nerves in the spinal cord or brain. In peripheral neuropathy, one nerve or a group of nerves may be damaged. Peripheral neuropathy is a broad category that includes many specific nerve disorders, like diabetic neuropathy, hereditary neuropathy, and carpal tunnel syndrome. ?What are the causes? ?This condition may be caused by: ?Diabetes. This is the most common cause of peripheral neuropathy. ?Nerve injury. ?Pressure or stress on a nerve that lasts a long time. ?Lack (deficiency) of B vitamins. This can result from alcoholism, poor diet, or a restricted diet. ?Infections. ?Autoimmune diseases, such as rheumatoid arthritis and systemic lupus erythematosus. ?Nerve diseases that are passed from parent to child (inherited). ?Some medicines, such as cancer medicines (chemotherapy). ?Poisonous (toxic) substances, such as lead and mercury. ?Too little blood flowing to the legs. ?Kidney disease. ?Thyroid disease. ?In some cases, the cause of this condition is not known. ?What are the signs or symptoms? ?Symptoms of this condition depend on which of your nerves is damaged. Common symptoms include: ?Loss of feeling (numbness) in the feet, hands, or both. ?Tingling in the feet, hands, or both. ?Burning pain. ?Very sensitive skin. ?Weakness. ?Not being able to move a part of the body (paralysis). ?Muscle twitching. ?Clumsiness or poor  coordination. ?Loss of balance. ?Not being able to control your bladder. ?Feeling dizzy. ?Sexual problems. ?How is this diagnosed? ?Diagnosing and finding the cause of peripheral neuropathy can be difficult. Your health care provider will take your medical history and do a physical exam. A neurological exam will also be done. This involves checking things that are affected by your brain, spinal cord, and nerves (nervous system). For example, your health care provider will check your reflexes, how you move, and what you can feel. ?You may have other tests, such as: ?Blood tests. ?Electromyogram (EMG) and nerve conduction tests. These tests check nerve function and how well the nerves are controlling the muscles. ?Imaging tests, such as CT scans or MRI to rule out other causes of your symptoms. ?Removing a small piece of nerve to be examined in a lab (nerve biopsy). ?Removing and examining a small amount of the fluid that surrounds the brain and spinal cord (lumbar puncture). ?How is this treated? ?Treatment for this condition may involve: ?Treating the underlying cause of the neuropathy, such as diabetes, kidney disease, or vitamin deficiencies. ?Stopping medicines that can cause neuropathy, such as chemotherapy. ?Medicine to help relieve pain. Medicines may include: ?Prescription or over-the-counter pain medicine. ?Antiseizure medicine. ?Antidepressants. ?Pain-relieving patches that are applied to painful areas of skin. ?Surgery to relieve pressure on a nerve or to destroy a nerve  that is causing pain. ?Physical therapy to help improve movement and balance. ?Devices to help you move around (assistive devices). ?Follow these instructions at home: ?Medicines ?Take over-the-counter and prescription medicines only as told by your health care provider. Do not take any other medicines without first asking your health care provider. ?Do not drive or use heavy machinery while taking prescription pain  medicine. ?Lifestyle ? ?Do not use any products that contain nicotine or tobacco, such as cigarettes and e-cigarettes. Smoking keeps blood from reaching damaged nerves. If you need help quitting, ask your health care provider. ?Avoid or

## 2021-12-11 ENCOUNTER — Encounter: Payer: Self-pay | Admitting: Primary Care

## 2021-12-11 NOTE — Progress Notes (Signed)
Reviewed and agree with assessment/plan. ? ? ?Zamira Hickam, MD ?[facility] ?12/11/2021, 2:34 PM ?Pager:  [phone] ? ?

## 2021-12-11 NOTE — Assessment & Plan Note (Signed)
Patient has symptoms of loud snoring, restless sleep. Epworth 2. Hx sleep apnea and restless leg syndrome. Previously on CPAP. Needs in-lab split night sleep study. Discussed risk of untreated sleep apnea including cardiac arrhythmias, pulm HTN, stroke, DM. We briefly reviewed treatment options. Encouraged patient to work on weight loss efforts and focus on side sleeping position/elevate head of bed. Advised against driving if experiencing excessive daytime sleepiness. Follow-up in 4-6 weeks to review sleep study results and discuss treatment options further. ?  ?

## 2021-12-25 ENCOUNTER — Ambulatory Visit
Admission: RE | Admit: 2021-12-25 | Discharge: 2021-12-25 | Disposition: A | Discharge status: Home / Self Care | Payer: Medicare HMO | Source: Ambulatory Visit | Attending: Family Medicine | Admitting: Family Medicine

## 2021-12-25 DIAGNOSIS — Z1231 Encounter for screening mammogram for malignant neoplasm of breast: Secondary | ICD-10-CM | POA: Insufficient documentation

## 2021-12-26 ENCOUNTER — Ambulatory Visit (INDEPENDENT_AMBULATORY_CARE_PROVIDER_SITE_OTHER): Payer: Medicare HMO | Admitting: Physician Assistant

## 2021-12-26 ENCOUNTER — Encounter: Payer: Self-pay | Admitting: Physician Assistant

## 2021-12-26 VITALS — BP 112/62 | HR 94 | Temp 98.4°F | Resp 16 | Ht 60.0 in | Wt 142.5 lb

## 2021-12-26 DIAGNOSIS — J4541 Moderate persistent asthma with (acute) exacerbation: Secondary | ICD-10-CM | POA: Diagnosis not present

## 2021-12-26 MED ORDER — FLUTICASONE-SALMETEROL 230-21 MCG/ACT IN AERO: 2.0000 | INHALATION_SPRAY | Freq: Two times a day (BID) | RESPIRATORY_TRACT | 1 refills | Status: DC

## 2021-12-26 NOTE — Progress Notes (Signed)
? ? ?  ?    Acute Office Visit ? ? ?Patient: Ruth [name]   DOB: December 25, 1945   76 y.o. Female  MRN: [id number] ?Visit Date: 12/26/2021 ? ?Today's healthcare provider: Oswaldo ConroyErin E Ignatz Deis, PA-C  ?Introduced myself to the patient as a Secondary school teacherA-C and provided education on APPs in clinical practice.  ? ?CC: SOB, cough, and congestion  ? ?Subjective  ?  ?HPI ?HPI   ? ? Cough   ? Additional comments: Onset for 2 weeks, coughing up phlegm. Pt believes may be related to allergies. Has been taking Coricidin and it helps ? ?  ?  ?Last edited by Forde Radonenteria-Garcia, Maricela, CMA on 12/26/2021 10:09 AM.  ?  ?  ? ? ?States she started having productive cough about 2 weeks ago  ?She states she is taking Coricidin about every 12 hours and states it is helping but the cough is lingering ?She states at night when she lays down she has to sit up because it is difficult to breath ?States she feels/ hears a clicking sound in her chest with inspiration  ?States she had a fever of 102 last month but is unsure if that was from a reaction to shingles shot ?Current episode she states she had a fever of ~101  ?She has taken multiple COVID at-home tests and has tested negative each time ? ? ?She is using her rescue inhaler about twice per day ?She is only using the Breo inhaler about 3 times per week but the past 2 weeks she has been using it every day as directed ? ?States she Is limiting her Virgel BouquetBreo use because she has to pay $40 each time for it  ? ? ?Medications: ?Outpatient Medications Prior to Visit  ?Medication Sig  ? acetaminophen (TYLENOL) 500 MG tablet Take 1-2 tablets (500-1,000 mg total) by mouth every 8 (eight) hours as needed.  ? albuterol (VENTOLIN HFA) 108 (90 Base) MCG/ACT inhaler Inhale 2 puffs into the lungs every 6 (six) hours as needed for wheezing or shortness of breath.  ? Ascorbic Acid (VITAMIN C) 1000 MG tablet Take 1,000 mg by mouth.  ? BREO ELLIPTA 200-25 MCG/INH AEPB Take 1 puff by mouth daily. Pt uses 3 times per week on average  ?  Calcium-Vitamin D 600-200 MG-UNIT tablet Take 1 tablet by mouth 2 (two) times daily.   ? Cholecalciferol (VITAMIN D3) 1000 units CAPS Take by mouth.  ? Cod Liver Oil 1000 MG CAPS Take by mouth.  ? Coenzyme Q10 (COQ10) 200 MG CAPS Take 200 mg by mouth daily.  ? Garlic 1000 MG CAPS Take 1,000 mg by mouth.   ? L-Lysine 500 MG TABS Take by mouth.  ? levocetirizine (XYZAL) 5 MG tablet Take 1 tablet (5 mg total) by mouth every evening.  ? magnesium oxide (MAG-OX) 400 MG tablet Take 500 mg by mouth daily.   ? montelukast (SINGULAIR) 10 MG tablet Take 1 tablet (10 mg total) by mouth at bedtime.  ? Nutritional Supplements (JOINT FORMULA PO) Take 2 tablets by mouth daily. glucosamine  ? olmesartan-hydrochlorothiazide (BENICAR HCT) 40-25 MG tablet Take 1 tablet by mouth daily.  ? omeprazole (PRILOSEC) 40 MG capsule Take 1 capsule (40 mg total) by mouth daily.  ? Potassium Gluconate 2.5 MEQ TABS Take 1 tablet by mouth. Pt taking 1 tab 99 mg daily  ? pravastatin (PRAVACHOL) 20 MG tablet Take 1 tablet (20 mg total) by mouth daily.  ? rOPINIRole (REQUIP) 0.5 MG tablet Take 1 tablet (0.5 mg  total) by mouth at bedtime.  ? ?No facility-administered medications prior to visit.  ? ? ?Review of Systems  ?Constitutional:  Positive for fever.  ?HENT:  Positive for rhinorrhea and sneezing.   ?Respiratory:  Positive for cough, chest tightness (when she lays down) and shortness of breath.   ? ? ?  Objective  ?  ?BP 112/62   Pulse 94   Temp 98.4 ?F (36.9 ?C) (Oral)   Resp 16   Ht 5' (1.524 m)   Wt 142 lb 8 oz (64.6 kg)   SpO2 97%   BMI 27.83 kg/m?  ? ? ?Physical Exam ?Vitals reviewed.  ?Constitutional:   ?   General: She is awake.  ?   Appearance: Normal appearance. She is well-developed and well-groomed.  ?HENT:  ?   Head: Normocephalic and atraumatic.  ?Eyes:  ?   General: Lids are normal.  ?   Conjunctiva/sclera: Conjunctivae normal.  ?Cardiovascular:  ?   Rate and Rhythm: Normal rate and regular rhythm.  ?   Pulses: Normal pulses.   ?   Heart sounds: Normal heart sounds.  ?Pulmonary:  ?   Effort: Pulmonary effort is normal. No tachypnea, bradypnea, respiratory distress or retractions.  ?   Breath sounds: Normal breath sounds. No decreased air movement. No decreased breath sounds, wheezing, rhonchi or rales.  ?Musculoskeletal:  ?   Right lower leg: No edema.  ?   Left lower leg: No edema.  ?Neurological:  ?   General: No focal deficit present.  ?   Mental Status: She is alert and oriented to person, place, and time.  ?Psychiatric:     ?   Mood and Affect: Mood normal.     ?   Behavior: Behavior normal. Behavior is cooperative.     ?   Thought Content: Thought content normal.     ?   Judgment: Judgment normal.  ?  ? ? ?No results found for any visits on 12/26/21. ? Assessment & Plan  ?  ? ?Problem List Items Addressed This Visit   ? ?  ? Respiratory  ? Moderate persistent asthma with acute exacerbation - Primary  ?  Chronic, historic condition, currently exacerbated  ?Suspect current exacerbation is likely due to allergies due to reassuring vitals and PE ?Recommend she change to Advair HFA 230-21, two puffs twice per day instead of Breo Ellipta to manage current exacerbation ?Can return to using [facility] after flair is resolved ?Recommend switching from Xyzal to Zyrtec or OTC cetirizine to further assist with allergy-type symptoms ?Since she is getting mild cough relief from OTC Coricidin, we discussed taking this approx 2x per day as needed until other medications start working  ?Discussed using Albuterol inhaler more frequently ( up to TID PRN) to further assist with symptoms ?Reviewed ED precautions with her  ? ?  ?  ? Relevant Medications  ? fluticasone-salmeterol (ADVAIR HFA) 230-21 MCG/ACT inhaler  ? ? ? ?No follow-ups on file. ? ? ?I, Tiphany Fayson E Aria Pickrell, PA-C, have reviewed all documentation for this visit. The documentation on 12/26/21 for the exam, diagnosis, procedures, and orders are all accurate and complete. ? ? ?Dequane Strahan, Mirian Mo  MPH ?[facility] ?[facility] ? ? ? ? ? ? ?

## 2021-12-26 NOTE — Patient Instructions (Addendum)
I think we need to the following to help with your breathing: ? ? ?I recommend we discontinue the Levocetirizine 5 mg and start Zyrtec ( or the generic -it's available over the counter) instead to help with nasal symptoms and allergies ? ?I am going to change your daily inhalers  ?Please discontinue the Breo Ellipta - don't discard this as you can likely return to using it once this flare resolves.  ?Start using the Advair HFA that I have sent in- two puffs twice per day  ? ?Continue taking the Montelukast to further assist with the allergies  ?You can use your albuterol (rescue inhaler) as needed every 6 hours ? ?You can continue taking the Coricidin at night to help with excess phlegm but that should start to resolve as these medications start working more.  ? ?You might want to ask about switching to Symbicort or Bevespi to help with cost of medication but let's see if these changes up above are effective before we make lasting changes.  ? ?If you continue to have fevers, increased trouble breathing, repeated coughing, loss of consciousness, low blood pressures please go to the Emergency Dept.  ?

## 2021-12-26 NOTE — Assessment & Plan Note (Signed)
Chronic, historic condition, currently exacerbated  ?Suspect current exacerbation is likely due to allergies due to reassuring vitals and PE ?Recommend she change to Advair HFA 230-21, two puffs twice per day instead of Breo Ellipta to manage current exacerbation ?Can return to using [facility] after flair is resolved ?Recommend switching from Xyzal to Zyrtec or OTC cetirizine to further assist with allergy-type symptoms ?Since she is getting mild cough relief from OTC Coricidin, we discussed taking this approx 2x per day as needed until other medications start working  ?Discussed using Albuterol inhaler more frequently ( up to TID PRN) to further assist with symptoms ?Reviewed ED precautions with her  ? ?

## 2022-01-07 ENCOUNTER — Ambulatory Visit: Payer: Self-pay

## 2022-01-07 DIAGNOSIS — E785 Hyperlipidemia, unspecified: Secondary | ICD-10-CM | POA: Diagnosis not present

## 2022-01-07 DIAGNOSIS — Z8249 Family history of ischemic heart disease and other diseases of the circulatory system: Secondary | ICD-10-CM | POA: Diagnosis not present

## 2022-01-07 DIAGNOSIS — K219 Gastro-esophageal reflux disease without esophagitis: Secondary | ICD-10-CM | POA: Diagnosis not present

## 2022-01-07 DIAGNOSIS — G629 Polyneuropathy, unspecified: Secondary | ICD-10-CM | POA: Diagnosis not present

## 2022-01-07 DIAGNOSIS — E663 Overweight: Secondary | ICD-10-CM | POA: Diagnosis not present

## 2022-01-07 DIAGNOSIS — M1990 Unspecified osteoarthritis, unspecified site: Secondary | ICD-10-CM | POA: Diagnosis not present

## 2022-01-07 DIAGNOSIS — I10 Essential (primary) hypertension: Secondary | ICD-10-CM | POA: Diagnosis not present

## 2022-01-07 DIAGNOSIS — Z809 Family history of malignant neoplasm, unspecified: Secondary | ICD-10-CM | POA: Diagnosis not present

## 2022-01-07 DIAGNOSIS — Z008 Encounter for other general examination: Secondary | ICD-10-CM | POA: Diagnosis not present

## 2022-01-07 DIAGNOSIS — Z7951 Long term (current) use of inhaled steroids: Secondary | ICD-10-CM | POA: Diagnosis not present

## 2022-01-07 DIAGNOSIS — G2581 Restless legs syndrome: Secondary | ICD-10-CM | POA: Diagnosis not present

## 2022-01-07 DIAGNOSIS — J45909 Unspecified asthma, uncomplicated: Secondary | ICD-10-CM | POA: Diagnosis not present

## 2022-01-07 DIAGNOSIS — Z6827 Body mass index (BMI) 27.0-27.9, adult: Secondary | ICD-10-CM | POA: Diagnosis not present

## 2022-01-07 NOTE — Telephone Encounter (Signed)
?  Chief Complaint: cough ?Symptoms: crackles at night, brown secretions,severe coughing episodes ?Frequency: 3 weeks ?Pertinent Negatives: Patient denies Chest pain, mild SOB, ?Disposition: ED /[] Urgent Care (no appt availability in office) / Appointment(In office/virtual)/  [facility]/ Home Care/ Refused Recommended Disposition /[] [facility]/  Follow-up with PCP ?Additional Notes: Pt refused earlier appt. Please review ? ?

## 2022-01-07 NOTE — Telephone Encounter (Signed)
Reason for Disposition ? [1] Known COPD or other severe lung disease (i.e., bronchiectasis, cystic fibrosis, lung surgery) AND [2] worsening symptoms (i.e., increased sputum purulence or amount, increased breathing difficulty ? ?Answer Assessment - Initial Assessment Questions ?1. ONSET: "When did the cough begin?"  ?    2 weeks ago ?2. SEVERITY: "How bad is the cough today?"  ?    Cough is wets pants ?3. SPUTUM: "Describe the color of your sputum" (none, dry cough; clear, [place], yellow, green) ?    yellow ?4. HEMOPTYSIS: "Are you coughing up any blood?" If so ask: "How much?" (flecks, streaks, tablespoons, etc.) ?    no ?5. DIFFICULTY BREATHING: "Are you having difficulty breathing?" If Yes, ask: "How bad is it?" (e.g., mild, moderate, severe)  ?  - MILD: No SOB at rest, mild SOB with walking, speaks normally in sentences, can lie down, no retractions, pulse < 100.  ?  - MODERATE: SOB at rest, SOB with minimal exertion and prefers to sit, cannot lie down flat, speaks in phrases, mild retractions, audible wheezing, pulse 100-120.  ?  - SEVERE: Very SOB at rest, speaks in single words, struggling to breathe, sitting hunched forward, retractions, pulse > 120  ?    mild ?6. FEVER: "Do you have a fever?" If Yes, ask: "What is your temperature, how was it measured, and when did it start?" ?    No  ?7. CARDIAC HISTORY: "Do you have any history of heart disease?" (e.g., heart attack, congestive heart failure)  ?    HTN ?8. LUNG HISTORY: "Do you have any history of lung disease?"  (e.g., pulmonary embolus, asthma, emphysema) ?    asthma ?9. PE RISK FACTORS: "Do you have a history of blood clots?" (or: recent major surgery, recent prolonged travel, bedridden) ?    no ?10. OTHER SYMPTOMS: "Do you have any other symptoms?" (e.g., runny nose, wheezing, chest pain) ?      no ?11. PREGNANCY: "Is there any chance you are pregnant?" "When was your last menstrual period?" ?      *No Answer* ?12. TRAVEL: "Have you traveled out of the  country in the last month?" (e.g., travel history, exposures) ?      *No Answer* ? ?Protocols used: Cough - Acute Productive-A-AH ? ?

## 2022-01-08 ENCOUNTER — Other Ambulatory Visit: Payer: Self-pay

## 2022-01-08 ENCOUNTER — Ambulatory Visit (INDEPENDENT_AMBULATORY_CARE_PROVIDER_SITE_OTHER): Payer: Medicare HMO | Admitting: Family Medicine

## 2022-01-08 ENCOUNTER — Encounter: Payer: Self-pay | Admitting: Family Medicine

## 2022-01-08 VITALS — BP 128/78 | HR 98 | Temp 98.5°F | Resp 16 | Ht 60.0 in | Wt 143.8 lb

## 2022-01-08 DIAGNOSIS — J4541 Moderate persistent asthma with (acute) exacerbation: Secondary | ICD-10-CM

## 2022-01-08 NOTE — Assessment & Plan Note (Signed)
Improved with twice daily use of Advair though still with some cough. With improved symptoms and normal lung exam, no current need for oral steroids. Discussed possibility of trelegy trial given ongoing symptoms on ICS/LABA but patient elects to continue current therapy given improvement. Continue allergic and GERD regimen. Await sleep study.  ?

## 2022-01-08 NOTE — Telephone Encounter (Signed)
Appt scheduled for today with [place]

## 2022-01-08 NOTE — Progress Notes (Signed)
? ? ?  SUBJECTIVE:  ? ?CHIEF COMPLAINT / HPI:  ? ?COUGH ?- seen previously 4/5 for same, switched Breo to Advair due to cost at that time as she was using Breo only 3x per week.  ?- typically will get similar symptoms around the same time every year.  ?- COVID negative multiple times ?- has used albuterol 3x since last appt. Last used last week.  ?- using Advair BID as prescribed ?- undergoing sleep study for OSA with Pulm tomorrow ?- taking singulair, zyrtec.  ?- taking omeprazole for GERD ?- congestion improved ? ?Duration: 1 month ?Circumstances of initial development of cough:  allergies ?Cough description: dry ?Aggravating factors:  going outside ?Alleviating factors: increasing advair, corcidin ?Status:  better ?Treatments attempted: Advair, albuterol, corcidin, zyrtec ?Wheezing: yes, better ?Shortness of breath: no ?Chest pain: no ?Chest tightness:no ?Nasal congestion: yes ?Runny nose:  better ?Frequent throat clearing or swallowing: yes ?Hemoptysis: no ?Fevers: no ?Weight loss: no ?Heartburn: no ? ? ?OBJECTIVE:  ? ?BP 128/78   Pulse 98   Temp 98.5 ?F (36.9 ?C) (Oral)   Resp 16   Ht 5' (1.524 m)   Wt 143 lb 12.8 oz (65.2 kg)   SpO2 98%   BMI 28.08 kg/m?   ?Gen: well appearing, in NAD ?Card: RRR ?Lungs: CTAB. No wheeze, rales, rhonchi. Appropriately saturated on RA with comfortable WOB. Occasional dry cough.  ?Ext: WWP, no edema ? ? ?ASSESSMENT/PLAN:  ? ?Moderate persistent asthma with acute exacerbation ?Improved with twice daily use of Advair though still with some cough. With improved symptoms and normal lung exam, no current need for oral steroids. Discussed possibility of trelegy trial given ongoing symptoms on ICS/LABA but patient elects to continue current therapy given improvement. Continue allergic and GERD regimen. Await sleep study.  ?  ? ? ?Myles Gip, DO ?

## 2022-01-08 NOTE — Telephone Encounter (Signed)
Just FYI.

## 2022-01-09 ENCOUNTER — Ambulatory Visit: Payer: Medicare HMO | Attending: Pulmonary Disease

## 2022-01-09 DIAGNOSIS — Z6828 Body mass index (BMI) 28.0-28.9, adult: Secondary | ICD-10-CM | POA: Insufficient documentation

## 2022-01-09 DIAGNOSIS — G2581 Restless legs syndrome: Secondary | ICD-10-CM | POA: Diagnosis not present

## 2022-01-09 DIAGNOSIS — R0683 Snoring: Secondary | ICD-10-CM | POA: Diagnosis not present

## 2022-01-09 DIAGNOSIS — G4733 Obstructive sleep apnea (adult) (pediatric): Secondary | ICD-10-CM | POA: Diagnosis not present

## 2022-01-09 DIAGNOSIS — H43813 Vitreous degeneration, bilateral: Secondary | ICD-10-CM | POA: Diagnosis not present

## 2022-01-16 ENCOUNTER — Ambulatory Visit: Payer: Medicare HMO | Admitting: Primary Care

## 2022-01-16 ENCOUNTER — Telehealth (INDEPENDENT_AMBULATORY_CARE_PROVIDER_SITE_OTHER): Payer: Medicare HMO

## 2022-01-16 DIAGNOSIS — R0683 Snoring: Secondary | ICD-10-CM

## 2022-01-16 DIAGNOSIS — G4733 Obstructive sleep apnea (adult) (pediatric): Secondary | ICD-10-CM

## 2022-01-16 NOTE — Telephone Encounter (Signed)
Patient has appt today to discuss sleep study.  ?Spoke to patient and verified that she had sleep study.  ?Lm for [facility] and faxed over sleep study request.  ? ?

## 2022-01-17 NOTE — Telephone Encounter (Signed)
Ruth [name] spoke with Ruth [name] at [facility] and was advised that sleep study has not been read for provider yet.  ? ?

## 2022-01-18 ENCOUNTER — Ambulatory Visit: Payer: Medicare HMO | Admitting: Family Medicine

## 2022-01-23 NOTE — Telephone Encounter (Signed)
Spoke to patient.  ?She stated that she researched the letter further and it was not in reference to sleep study.  ? ?

## 2022-01-23 NOTE — Telephone Encounter (Signed)
Received note from [facility] via fax-study has not been read yet. ? ?Dr. Vassie Loll, please advise. Thanks ?

## 2022-01-23 NOTE — Telephone Encounter (Signed)
No evidence of OSA or other sleep disorder on her study ?

## 2022-01-24 NOTE — Telephone Encounter (Signed)
Patient is aware of results and voiced her understanding. ?She would like to cancel 01/28/2022 visit, as she does not currently have any sx or concerns.  ?Appt canceled.  ?Nothing further needed.  ?

## 2022-01-24 NOTE — Telephone Encounter (Signed)
She does not need to keep visit. No evidence of sleep apnea. Cont to maintain normal BMI, encourage side sleeping position and advised against driving if tired. If she has other sleep concerns she can keep apt.

## 2022-01-24 NOTE — Telephone Encounter (Signed)
Ruth [name], would you like for patient to keep scheduled visit for 01/28/2022? ?

## 2022-01-25 DIAGNOSIS — G4733 Obstructive sleep apnea (adult) (pediatric): Secondary | ICD-10-CM | POA: Diagnosis not present

## 2022-01-25 DIAGNOSIS — Z9989 Dependence on other enabling machines and devices: Secondary | ICD-10-CM | POA: Diagnosis not present

## 2022-01-25 DIAGNOSIS — R0683 Snoring: Secondary | ICD-10-CM | POA: Diagnosis not present

## 2022-01-28 ENCOUNTER — Telehealth: Payer: Medicare HMO | Admitting: Primary Care

## 2022-02-11 IMAGING — MG DIGITAL SCREENING BILAT W/ TOMO W/ CAD
8 series · 8 of 24 positions shown · non-contrast
Comparison: Previous exam(s).

CLINICAL DATA: Screening.

EXAM:
DIGITAL SCREENING BILATERAL MAMMOGRAM WITH TOMO AND CAD

[R CC synth-2D]
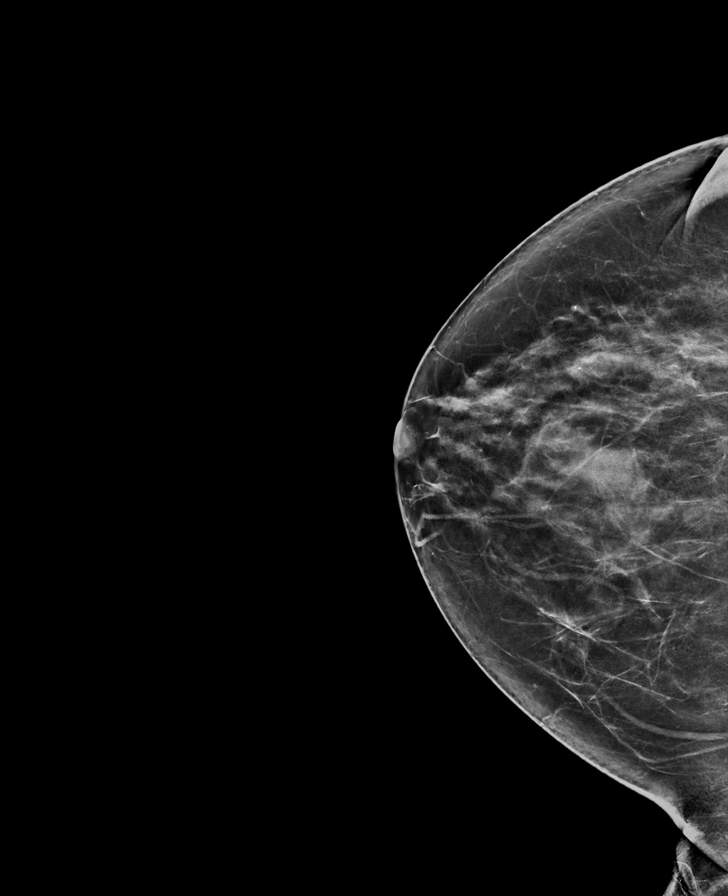

[L MLO synth-2D]
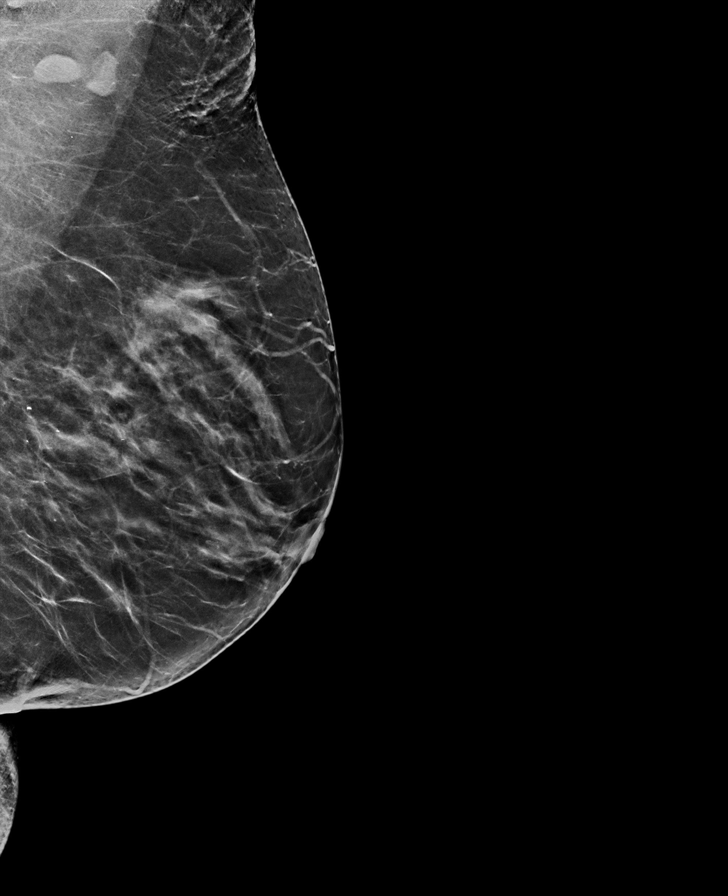

[R MLO synth-2D]
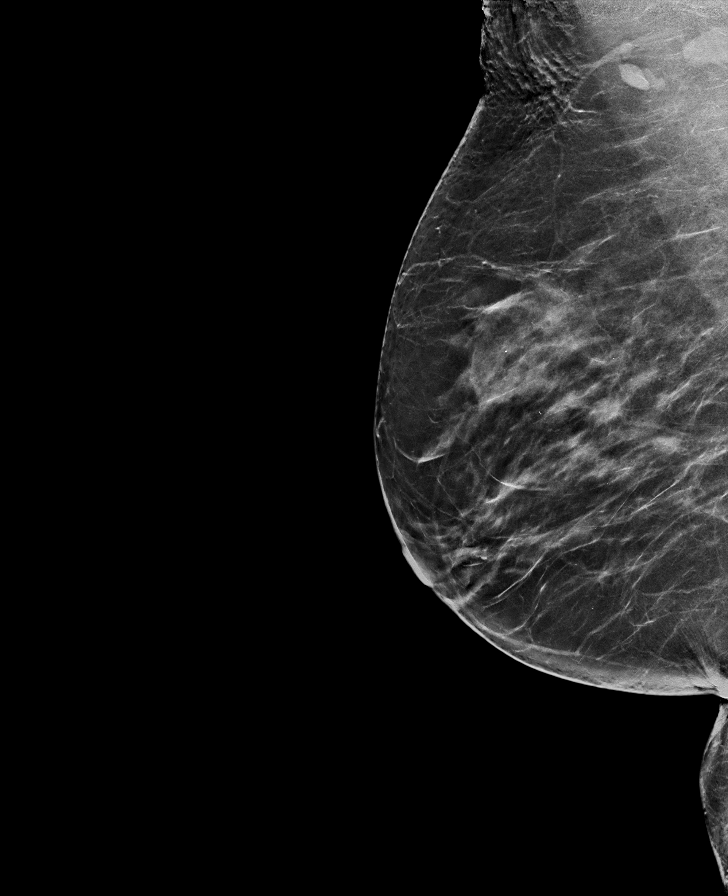

[L CC synth-2D]
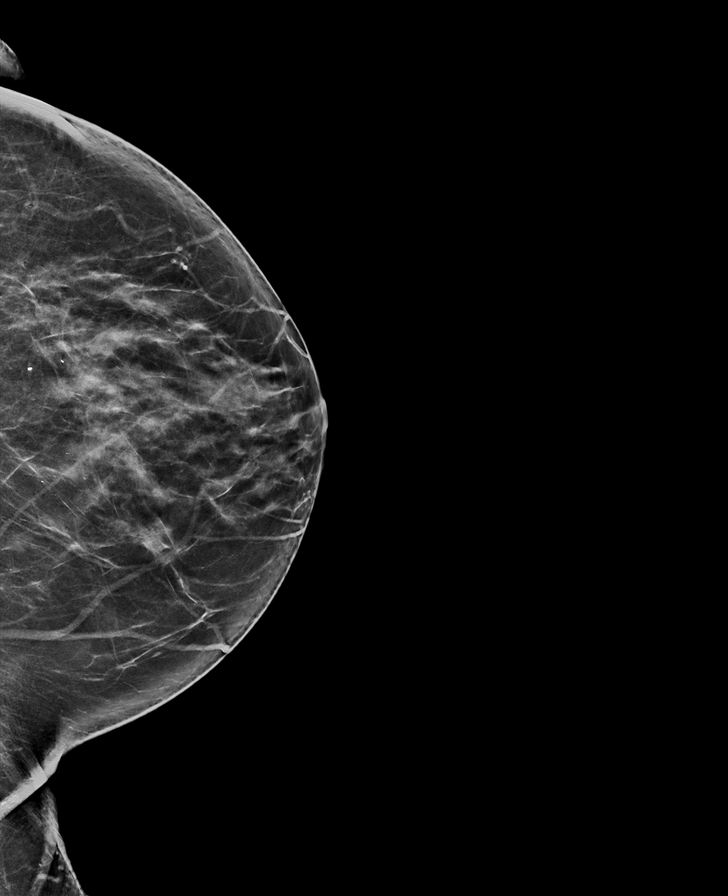

[R CC tomo · tomo slice 35/70.0]
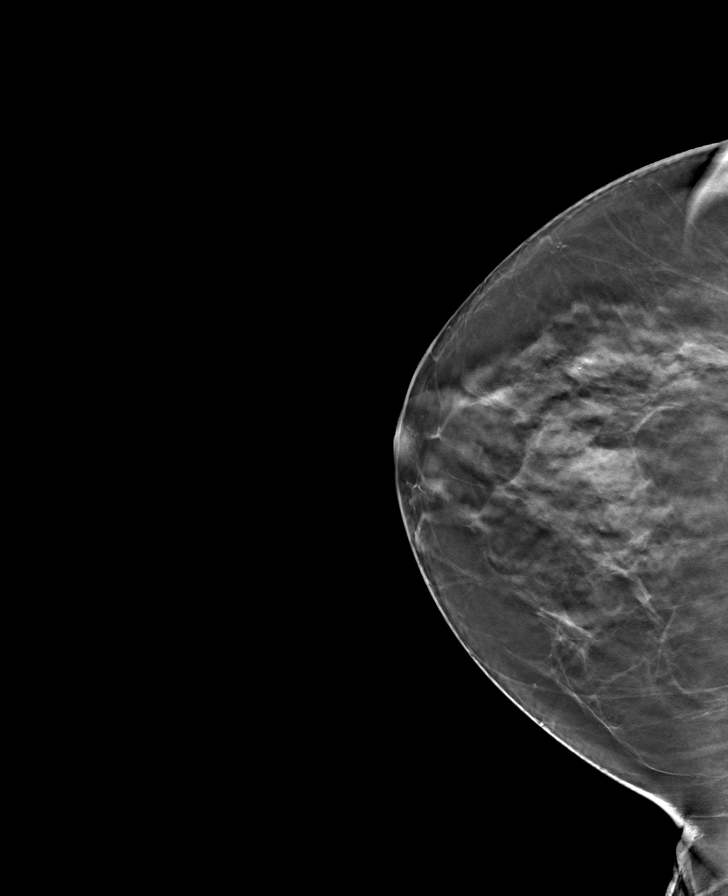

[L MLO tomo · tomo slice 35/69.0]
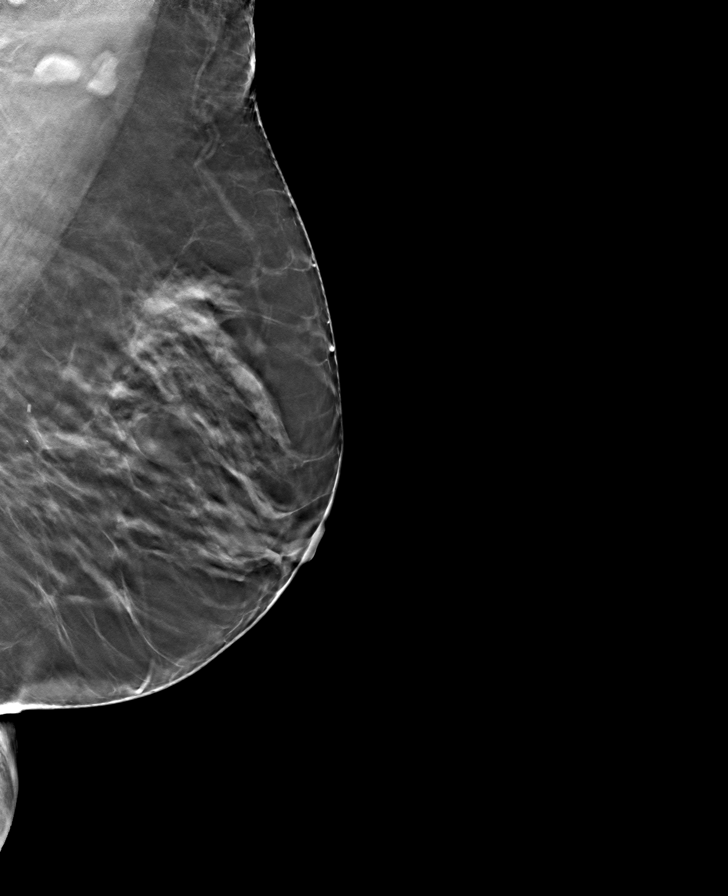

[L CC tomo · tomo slice 35/69.0]
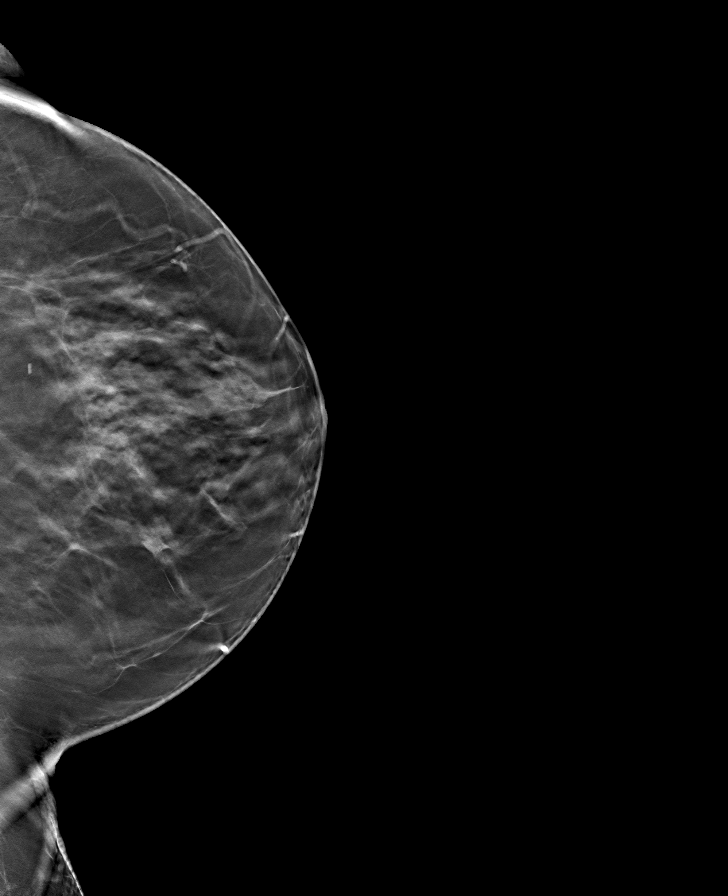

[R MLO tomo · tomo slice 37/72.0]
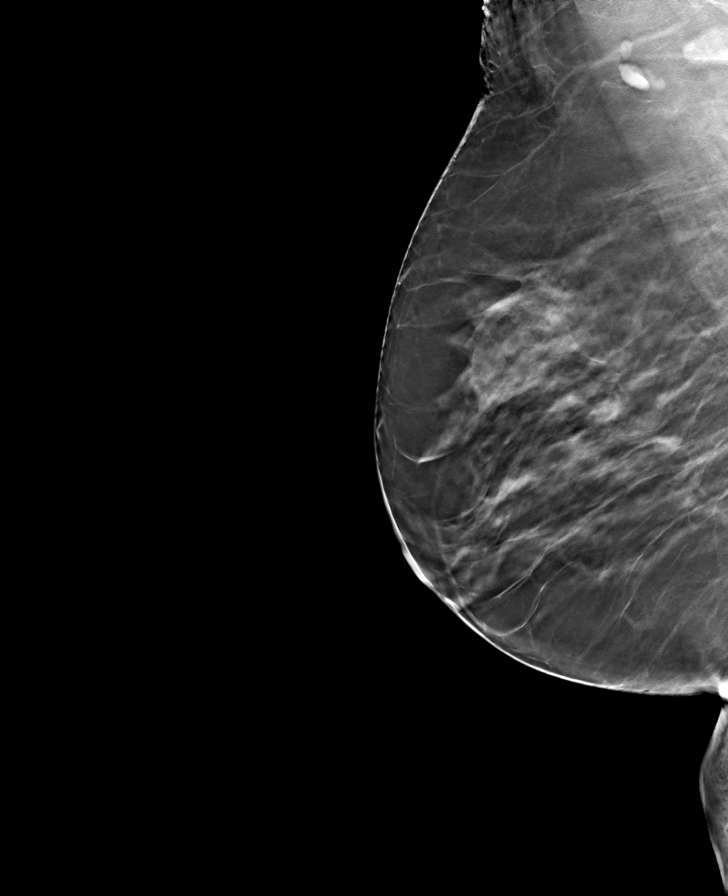

[8 of 24 positions shown; findings below may reference images not displayed]

ACR Breast Density Category c: The breast tissue is heterogeneously
dense, which may obscure small masses.
FINDINGS: There are no findings suspicious for malignancy. Images were
processed with CAD.
IMPRESSION: No mammographic evidence of malignancy. A result letter of this
screening mammogram will be mailed directly to the patient.

RECOMMENDATION:
Screening mammogram in one year. (Code:[id number])

BI-RADS CATEGORY  1: Negative.

## 2022-02-22 DIAGNOSIS — M9901 Segmental and somatic dysfunction of cervical region: Secondary | ICD-10-CM | POA: Diagnosis not present

## 2022-02-22 DIAGNOSIS — M9903 Segmental and somatic dysfunction of lumbar region: Secondary | ICD-10-CM | POA: Diagnosis not present

## 2022-02-22 DIAGNOSIS — M5033 Other cervical disc degeneration, cervicothoracic region: Secondary | ICD-10-CM | POA: Diagnosis not present

## 2022-02-22 DIAGNOSIS — R519 Headache, unspecified: Secondary | ICD-10-CM | POA: Diagnosis not present

## 2022-02-27 ENCOUNTER — Telehealth: Payer: Medicare HMO

## 2022-04-26 NOTE — Progress Notes (Signed)
Name: Ruth [name]   MRN: [id number]    DOB: 1946/02/12   Date:04/29/2022       Progress Note  Subjective  Chief Complaint  Follow Up  HPI  OSA: she sees Dr. [place] Callas, she started CPAP machine in  May 2020, she stopped using it months ago because of RLS and neuropathy that kept her awake at night She went back for a facility study and was told normal . She has been off CPAP, she has lost weight with a low sugar diet    GERD: tried to stop omeprazole but unable to tolerated symptoms without medication, she has been unable to take wean down to every other day. Unchanged   Polyneuropathy: seen by Dr. Sherryll Burger in the past , she states still has some nerve pains, but not as severe , she stopped taking   gabapentin and lyrica because she got drowsy . She has been taking Requip and drinking plenty of water and magnesium and seems to help with symptoms Sometimes she gets up during the night to have a mustard. She states one night back in June she got up during the night to do that , felt dizzy, called for help and got up on the floor. Husband wanted to call 911 but she refused to go. No episodes since. Advised to call 911 if it happens again, also to discuss with neurologist  She states she think it was secondary to intense pain She is also on Requip for RLS and we will adjust dose    HTN: bp is at goal, she is only on Benicar HCTZ 40/25 mg daily . Denies chest pain, palpitation or sob.    Dyslipidemia:We started her on medications last year , last LDL was 52, continue medications. No side effects  Vitamin D deficiency: taking otc supplements and last level was at goal     Asthma: under the care of Dr. [place] Callas, she states well controlled with medication, no wheezing or SOB but she has an occasional cough. She stopped taking Breo on her own. Currently using Advair prn   B12 : currently on SL B12 , and doing well, fatigue has improved. Unchanged   Senile purpura: reassurance given , unchanged   Patient  Active Problem List   Diagnosis Date Noted   Moderate persistent asthma with acute exacerbation 12/26/2021   OSA on CPAP 04/15/2019   B12 deficiency 12/09/2018   Dyslipidemia 11/08/2017   Vitamin D deficiency 10/31/2016   Osteopenia of left hip 10/31/2016   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   BPV (benign positional vertigo) 04/17/2016   RLS (restless legs syndrome) 04/17/2016   GERD (gastroesophageal reflux disease) 03/11/2016   History of hyperparathyroidism 03/11/2016   History of gastric ulcer 03/11/2016   Essential hypertension 04/24/2015   Primary osteoarthritis involving multiple joints 04/24/2015   Lumbago 04/24/2015   Family history of colonic polyps     Past Surgical History:  Procedure Laterality Date   CARPAL TUNNEL RELEASE Bilateral 2000   CATARACT EXTRACTION W/PHACO Left 04/18/2020   Procedure: CATARACT EXTRACTION PHACO AND INTRAOCULAR LENS PLACEMENT (IOC) LEFT 4.78  00:32.8;  Surgeon: Galen Manila, MD;  Location: [facility];  Service: Ophthalmology;  Laterality: Left;   CATARACT EXTRACTION W/PHACO Right 05/09/2020   Procedure: CATARACT EXTRACTION PHACO AND INTRAOCULAR LENS PLACEMENT (IOC) RIGHT 3.41 00:21.8;  Surgeon: Galen Manila, MD;  Location: [facility];  Service: Ophthalmology;  Laterality: Right;   COLONOSCOPY  06/17/2012   A few two mm ulcers were found  in the sigmoid colon, no bleeding present,bx taken-path reporet on the few tiny ulcers ,non specific   COLONOSCOPY WITH PROPOFOL N/A 06/11/2017   Procedure: COLONOSCOPY WITH PROPOFOL;  Surgeon: Kieth Brightly, MD;  Location: [facility];  Service: Endoscopy;  Laterality: N/A;   parathyroid surgery  2008   TEMPOROMANDIBULAR JOINT ARTHROPLASTY Left 1984   1989,1990-bilateral   TUBAL LIGATION      Family History  Adopted: Yes  Problem Relation Age of Onset   Kidney disease Mother    Ovarian cancer Mother    Hypertension Father    Stroke Father    Lupus Sister     Breast cancer Maternal Grandmother    Breast cancer Cousin        pat cousins    Social History   Tobacco Use   Smoking status: Never   Smokeless tobacco: Never  Substance Use Topics   Alcohol use: Yes    Comment: wine     Current Outpatient Medications:    acetaminophen (TYLENOL) 500 MG tablet, Take 1-2 tablets (500-1,000 mg total) by mouth every 8 (eight) hours as needed., Disp: 100 tablet, Rfl: 2   albuterol (VENTOLIN HFA) 108 (90 Base) MCG/ACT inhaler, Inhale 2 puffs into the lungs every 6 (six) hours as needed for wheezing or shortness of breath., Disp: 1 each, Rfl: 0   Ascorbic Acid (VITAMIN C) 1000 MG tablet, Take 1,000 mg by mouth., Disp: , Rfl:    Calcium-Vitamin D 600-200 MG-UNIT tablet, Take 1 tablet by mouth 2 (two) times daily. , Disp: , Rfl:    Cholecalciferol (VITAMIN D3) 1000 units CAPS, Take by mouth., Disp: , Rfl:    Cod Liver Oil 1000 MG CAPS, Take by mouth., Disp: , Rfl:    Coenzyme Q10 (COQ10) 200 MG CAPS, Take 200 mg by mouth daily., Disp: , Rfl:    Garlic 1000 MG CAPS, Take 1,000 mg by mouth. , Disp: , Rfl:    L-Lysine 500 MG TABS, Take by mouth., Disp: , Rfl:    levocetirizine (XYZAL) 5 MG tablet, Take 1 tablet (5 mg total) by mouth every evening., Disp: 90 tablet, Rfl: 0   magnesium oxide (MAG-OX) 400 MG tablet, Take 500 mg by mouth daily. , Disp: , Rfl:    montelukast (SINGULAIR) 10 MG tablet, Take 1 tablet (10 mg total) by mouth at bedtime., Disp: 90 tablet, Rfl: 1   Nutritional Supplements (JOINT FORMULA PO), Take 2 tablets by mouth daily. glucosamine, Disp: , Rfl:    olmesartan-hydrochlorothiazide (BENICAR HCT) 40-25 MG tablet, Take 1 tablet by mouth daily., Disp: 90 tablet, Rfl: 1   omeprazole (PRILOSEC) 40 MG capsule, Take 1 capsule (40 mg total) by mouth daily., Disp: 90 capsule, Rfl: 1   Potassium Gluconate 2.5 MEQ TABS, Take 1 tablet by mouth. Pt taking 1 tab 99 mg daily, Disp: , Rfl:    pravastatin (PRAVACHOL) 20 MG tablet, Take 1 tablet (20 mg  total) by mouth daily., Disp: 90 tablet, Rfl: 1   rOPINIRole (REQUIP) 0.5 MG tablet, Take 1 tablet (0.5 mg total) by mouth at bedtime., Disp: 90 tablet, Rfl: 1   fluticasone-salmeterol (ADVAIR HFA) 230-21 MCG/ACT inhaler, Inhale 2 puffs into the lungs 2 (two) times daily. (Patient not taking: Reported on 04/29/2022), Disp: 8 g, Rfl: 1  Allergies  Allergen Reactions   Ace Inhibitors Cough   Aspirin Nausea Only    Abdominal pain   Crestor [Rosuvastatin]     Muscle pain     I personally reviewed  active problem list, medication list, allergies, family history, social history, health maintenance with the patient/caregiver today.   ROS  Constitutional: Negative for fever or weight change.  Respiratory: Negative for cough and shortness of breath.   Cardiovascular: Negative for chest pain or palpitations.  Gastrointestinal: Negative for abdominal pain, no bowel changes.  Musculoskeletal: Negative for gait problem or joint swelling.  Skin: Negative for rash.  Neurological: Negative for dizziness or headache.  No other specific complaints in a complete review of systems (except as listed in HPI above).   Objective  Vitals:   04/29/22 1115 04/29/22 1127  BP: (!) 144/72 136/72  Pulse: 78   Resp: 16   Temp: 98.4 F (36.9 C)   TempSrc: Oral   SpO2: 98%   Weight: 139 lb 9.6 oz (63.3 kg)   Height: 5' (1.524 m)     Body mass index is 27.26 kg/m.  Physical Exam  Constitutional: Patient appears well-developed and well-nourished.  No distress.  HEENT: head atraumatic, normocephalic, pupils equal and reactive to light, neck supple Cardiovascular: Normal rate, regular rhythm and normal heart sounds.  No murmur heard. No BLE edema. Pulmonary/Chest: Effort normal and breath sounds normal. No respiratory distress. Abdominal: Soft.  There is no tenderness. Psychiatric: Patient has a normal mood and affect. behavior is normal. Judgment and thought content normal.    PHQ2/9:    04/29/2022    11:14 AM 01/08/2022    3:07 PM 12/26/2021   10:03 AM 12/04/2021    9:37 AM 10/29/2021    8:16 AM  Depression screen PHQ 2/9  Decreased Interest 0 0 0 0 0  Down, Depressed, Hopeless 0 0 0 0 0  PHQ - 2 Score 0 0 0 0 0  Altered sleeping 0  0 0 0  Tired, decreased energy 0  0 0 0  Change in appetite 0  0 0 0  Feeling bad or failure about yourself  0  0 0 0  Trouble concentrating 0  0 0 0  Moving slowly or fidgety/restless 0  0 0 0  Suicidal thoughts 0  0 0 0  PHQ-9 Score 0  0 0 0  Difficult doing work/chores Not difficult at all  Not difficult at all      phq 9 is negative   Fall Risk:    04/29/2022   11:14 AM 01/08/2022    3:07 PM 12/26/2021   10:03 AM 12/04/2021    9:42 AM 10/29/2021    8:16 AM  Fall Risk   Falls in the past year? 0 0 0 0 0  Number falls in past yr: 0 0 0 0 0  Injury with Fall? 0 0 0 0 0  Risk for fall due to : No Fall Risks  No Fall Risks No Fall Risks No Fall Risks  Follow up Falls prevention discussed;Education provided Falls evaluation completed Falls prevention discussed Falls prevention discussed Falls prevention discussed      Functional Status Survey: Is the patient deaf or have difficulty hearing?: No Does the patient have difficulty seeing, even when wearing glasses/contacts?: No Does the patient have difficulty concentrating, remembering, or making decisions?: No Does the patient have difficulty walking or climbing stairs?: No Does the patient have difficulty dressing or bathing?: No Does the patient have difficulty doing errands alone such as visiting a doctor's office or shopping?: No    Assessment & Plan  1. Senile purpura (HCC)  Stable and reassurance given   2. Peripheral polyneuropathy  Stable  3. RLS (restless legs syndrome)  - rOPINIRole (REQUIP) 1 MG tablet; Take 1 tablet (1 mg total) by mouth at bedtime.  Dispense: 90 tablet; Refill: 1  4. B12 deficiency   5. Dyslipidemia  - pravastatin (PRAVACHOL) 20 MG tablet; Take 1 tablet  (20 mg total) by mouth daily.  Dispense: 90 tablet; Refill: 1  6. Vitamin D deficiency   7. Essential hypertension  - olmesartan-hydrochlorothiazide (BENICAR HCT) 40-25 MG tablet; Take 1 tablet by mouth daily.  Dispense: 90 tablet; Refill: 1  8. Asthma, moderate persistent, well-controlled   9. Gastroesophageal reflux disease without esophagitis  - omeprazole (PRILOSEC) 40 MG capsule; Take 1 capsule (40 mg total) by mouth daily.  Dispense: 90 capsule; Refill: 1  10. Chronic allergic bronchitis  - montelukast (SINGULAIR) 10 MG tablet; Take 1 tablet (10 mg total) by mouth at bedtime.  Dispense: 90 tablet; Refill: 1

## 2022-04-29 ENCOUNTER — Encounter: Payer: Self-pay | Admitting: Family Medicine

## 2022-04-29 ENCOUNTER — Ambulatory Visit (INDEPENDENT_AMBULATORY_CARE_PROVIDER_SITE_OTHER): Payer: Medicare HMO | Admitting: Family Medicine

## 2022-04-29 VITALS — BP 136/72 | HR 78 | Temp 98.4°F | Resp 16 | Ht 60.0 in | Wt 139.6 lb

## 2022-04-29 DIAGNOSIS — J45909 Unspecified asthma, uncomplicated: Secondary | ICD-10-CM | POA: Insufficient documentation

## 2022-04-29 DIAGNOSIS — E538 Deficiency of other specified B group vitamins: Secondary | ICD-10-CM | POA: Diagnosis not present

## 2022-04-29 DIAGNOSIS — G629 Polyneuropathy, unspecified: Secondary | ICD-10-CM | POA: Diagnosis not present

## 2022-04-29 DIAGNOSIS — D692 Other nonthrombocytopenic purpura: Secondary | ICD-10-CM | POA: Insufficient documentation

## 2022-04-29 DIAGNOSIS — I10 Essential (primary) hypertension: Secondary | ICD-10-CM

## 2022-04-29 DIAGNOSIS — E559 Vitamin D deficiency, unspecified: Secondary | ICD-10-CM | POA: Diagnosis not present

## 2022-04-29 DIAGNOSIS — E785 Hyperlipidemia, unspecified: Secondary | ICD-10-CM

## 2022-04-29 DIAGNOSIS — J45998 Other asthma: Secondary | ICD-10-CM

## 2022-04-29 DIAGNOSIS — G2581 Restless legs syndrome: Secondary | ICD-10-CM

## 2022-04-29 DIAGNOSIS — J4540 Moderate persistent asthma, uncomplicated: Secondary | ICD-10-CM

## 2022-04-29 DIAGNOSIS — K219 Gastro-esophageal reflux disease without esophagitis: Secondary | ICD-10-CM

## 2022-04-29 MED ORDER — OMEPRAZOLE 40 MG PO CPDR: 40.0000 mg | DELAYED_RELEASE_CAPSULE | Freq: Every day | ORAL | 1 refills | Status: DC

## 2022-04-29 MED ORDER — MONTELUKAST SODIUM 10 MG PO TABS: 10.0000 mg | ORAL_TABLET | Freq: Every day | ORAL | 1 refills | Status: DC

## 2022-04-29 MED ORDER — PRAVASTATIN SODIUM 20 MG PO TABS: 20.0000 mg | ORAL_TABLET | Freq: Every day | ORAL | 1 refills | Status: DC

## 2022-04-29 MED ORDER — ROPINIROLE HCL 1 MG PO TABS: 1.0000 mg | ORAL_TABLET | Freq: Every day | ORAL | 1 refills | Status: DC

## 2022-04-29 MED ORDER — OLMESARTAN MEDOXOMIL-HCTZ 40-25 MG PO TABS: 1.0000 | ORAL_TABLET | Freq: Every day | ORAL | 1 refills | Status: DC

## 2022-06-05 ENCOUNTER — Ambulatory Visit
Admission: RE | Admit: 2022-06-05 | Discharge: 2022-06-05 | Disposition: A | Discharge status: Home / Self Care | Payer: Medicare HMO | Source: Ambulatory Visit | Attending: Family Medicine | Admitting: Family Medicine

## 2022-06-05 DIAGNOSIS — Z780 Asymptomatic menopausal state: Secondary | ICD-10-CM | POA: Diagnosis not present

## 2022-06-05 DIAGNOSIS — M85851 Other specified disorders of bone density and structure, right thigh: Secondary | ICD-10-CM | POA: Diagnosis not present

## 2022-07-05 NOTE — Patient Instructions (Signed)
Preventive Care 65 Years and Older, Female Preventive care refers to lifestyle choices and visits with your health care provider that can promote health and wellness. Preventive care visits are also called wellness exams. What can I expect for my preventive care visit? Counseling Your health care provider may ask you questions about your: Medical history, including: Past medical problems. Family medical history. Pregnancy and menstrual history. History of falls. Current health, including: Memory and ability to understand (cognition). Emotional well-being. Home life and relationship well-being. Sexual activity and sexual health. Lifestyle, including: Alcohol, nicotine or tobacco, and drug use. Access to firearms. Diet, exercise, and sleep habits. Work and work environment. Sunscreen use. Safety issues such as seatbelt and bike helmet use. Physical exam Your health care provider will check your: Height and weight. These may be used to calculate your BMI (body mass index). BMI is a measurement that tells if you are at a healthy weight. Waist circumference. This measures the distance around your waistline. This measurement also tells if you are at a healthy weight and may help predict your risk of certain diseases, such as type 2 diabetes and high blood pressure. Heart rate and blood pressure. Body temperature. Skin for abnormal spots. What immunizations do I need?  Vaccines are usually given at various ages, according to a schedule. Your health care provider will recommend vaccines for you based on your age, medical history, and lifestyle or other factors, such as travel or where you work. What tests do I need? Screening Your health care provider may recommend screening tests for certain conditions. This may include: Lipid and cholesterol levels. Hepatitis C test. Hepatitis B test. HIV (human immunodeficiency virus) test. STI (sexually transmitted infection) testing, if you are at  risk. Lung cancer screening. Colorectal cancer screening. Diabetes screening. This is done by checking your blood sugar (glucose) after you have not eaten for a while (fasting). Mammogram. Talk with your health care provider about how often you should have regular mammograms. BRCA-related cancer screening. This may be done if you have a family history of breast, ovarian, tubal, or peritoneal cancers. Bone density scan. This is done to screen for osteoporosis. Talk with your health care provider about your test results, treatment options, and if necessary, the need for more tests. Follow these instructions at home: Eating and drinking  Eat a diet that includes fresh fruits and vegetables, whole grains, lean protein, and low-fat dairy products. Limit your intake of foods with high amounts of sugar, saturated fats, and salt. Take vitamin and mineral supplements as recommended by your health care provider. Do not drink alcohol if your health care provider tells you not to drink. If you drink alcohol: Limit how much you have to 0-1 drink a day. Know how much alcohol is in your drink. In the [place] one drink equals one 12 oz bottle of beer (355 mL), one 5 oz glass of wine (148 mL), or one 1 oz glass of hard liquor (44 mL). Lifestyle Brush your teeth every morning and night with fluoride toothpaste. Floss one time each day. Exercise for at least 30 minutes 5 or more days each week. Do not use any products that contain nicotine or tobacco. These products include cigarettes, chewing tobacco, and vaping devices, such as e-cigarettes. If you need help quitting, ask your health care provider. Do not use drugs. If you are sexually active, practice safe sex. Use a condom or other form of protection in order to prevent STIs. Take aspirin only as told by   your health care provider. Make sure that you understand how much to take and what form to take. Work with your health care provider to find out whether it  is safe and beneficial for you to take aspirin daily. Ask your health care provider if you need to take a cholesterol-lowering medicine (statin). Find healthy ways to manage stress, such as: Meditation, yoga, or listening to music. Journaling. Talking to a trusted person. Spending time with friends and family. Minimize exposure to UV radiation to reduce your risk of skin cancer. Safety Always wear your seat belt while driving or riding in a vehicle. Do not drive: If you have been drinking alcohol. Do not ride with someone who has been drinking. When you are tired or distracted. While texting. If you have been using any mind-altering substances or drugs. Wear a helmet and other protective equipment during sports activities. If you have firearms in your house, make sure you follow all gun safety procedures. What's next? Visit your health care provider once a year for an annual wellness visit. Ask your health care provider how often you should have your eyes and teeth checked. Stay up to date on all vaccines. This information is not intended to replace advice given to you by your health care provider. Make sure you discuss any questions you have with your health care provider. Document Revised: 03/07/2021 Document Reviewed: 03/07/2021 Elsevier Patient Education  2023 [employer]  

## 2022-07-05 NOTE — Progress Notes (Unsigned)
Name: Ruth [name]   MRN: [id number]    DOB: 1946/03/06   Date:07/08/2022       Progress Note  Subjective  Chief Complaint  Annual Exam  HPI  Patient presents for annual CPE.  Diet: balanced diet  Exercise: discussed 150 minutes per week   Last Eye Exam: yearly  Last Dental Exam: over due for exam   [place] Office Visit from 01/08/2022 in [facility]  AUDIT-C Score 0      Depression: Phq 9 is  negative    07/08/2022   10:23 AM 04/29/2022   11:14 AM 01/08/2022    3:07 PM 12/26/2021   10:03 AM 12/04/2021    9:37 AM  Depression screen PHQ 2/9  Decreased Interest 0 0 0 0 0  Down, Depressed, Hopeless 0 0 0 0 0  PHQ - 2 Score 0 0 0 0 0  Altered sleeping 0 0  0 0  Tired, decreased energy 0 0  0 0  Change in appetite 0 0  0 0  Feeling bad or failure about yourself  0 0  0 0  Trouble concentrating 0 0  0 0  Moving slowly or fidgety/restless 0 0  0 0  Suicidal thoughts 0 0  0 0  PHQ-9 Score 0 0  0 0  Difficult doing work/chores  Not difficult at all  Not difficult at all    Hypertension: BP Readings from Last 3 Encounters:  07/08/22 136/70  04/29/22 136/72  01/08/22 128/78   Obesity: Wt Readings from Last 3 Encounters:  07/08/22 143 lb (64.9 kg)  04/29/22 139 lb 9.6 oz (63.3 kg)  01/08/22 143 lb 12.8 oz (65.2 kg)   BMI Readings from Last 3 Encounters:  07/08/22 27.93 kg/m  04/29/22 27.26 kg/m  01/08/22 28.08 kg/m     Vaccines:   HPV: N/A Tdap: 2013, due- sent rx to pharmacy  Shingrix: up to date Pneumonia: up to date Flu: up to date  COVID-89: discussed Booster    Hep C Screening: 04/20/12 STD testing and prevention (HIV/chl/gon/syphilis): N/A Intimate partner violence: negative screen  Sexual History : not anymore  Menstrual History/LMP/Abnormal Bleeding: s/p menpause  Discussed importance of follow up if any post-menopausal bleeding: yes  Incontinence Symptoms: negative for symptoms   Breast cancer:  - Last Mammogram:  12/25/21 - BRCA gene screening: N/A  Osteoporosis Prevention : Discussed high calcium and vitamin D supplementation, weight bearing exercises Bone density: 06/05/22 - discussed high calcium diet and vitamin D supplementation   FRAX* RESULTS:  (version: 3.5) 10-year Probability of Fracture1 Major Osteoporotic Fracture2 Hip Fracture 5.1% 1.0%  Cervical cancer screening: N/A  Skin cancer: Discussed monitoring for atypical lesions  Colorectal cancer: 06/11/17  Lung cancer:  Low Dose CT Chest recommended if Age 79-80 years, 20 pack-year currently smoking OR have quit w/in 15years. Patient does not qualify for screen   ECG: 10/05/15  Advanced Care Planning: A voluntary discussion about advance care planning including the explanation and discussion of advance directives.  Discussed health care proxy and Living will, and the patient was able to identify a health care proxy as husband .  Patient does not have a living will and power of attorney of health care   Lipids: Lab Results  Component Value Date   CHOL 120 10/29/2021   CHOL 124 11/24/2020   CHOL 155 10/18/2019   Lab Results  Component Value Date   HDL 46 (L) 10/29/2021   HDL 53 11/24/2020  HDL 43 (L) 10/18/2019   Lab Results  Component Value Date   LDLCALC 52 10/29/2021   LDLCALC 48 11/24/2020   LDLCALC 85 10/18/2019   Lab Results  Component Value Date   TRIG 136 10/29/2021   TRIG 142 11/24/2020   TRIG 177 (H) 10/18/2019   Lab Results  Component Value Date   CHOLHDL 2.6 10/29/2021   CHOLHDL 2.3 11/24/2020   CHOLHDL 3.6 10/18/2019   No results found for: "LDLDIRECT"  Glucose: Glucose, Bld  Date Value Ref Range Status  10/29/2021 88 65 - 99 mg/dL Final    Comment:    .            Fasting reference interval .   11/24/2020 84 65 - 99 mg/dL Final    Comment:    .            Fasting reference interval .   10/18/2019 82 65 - 99 mg/dL Final    Comment:    .            Fasting reference interval .      Patient Active Problem List   Diagnosis Date Noted   Senile purpura (Rafael Gonzalez) 04/29/2022   Peripheral polyneuropathy 04/29/2022   Asthma, well controlled 04/29/2022   Moderate persistent asthma with acute exacerbation 12/26/2021   OSA on CPAP 04/15/2019   B12 deficiency 12/09/2018   Dyslipidemia 11/08/2017   Vitamin D deficiency 10/31/2016   Osteopenia of left hip 10/31/2016   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   BPV (benign positional vertigo) 04/17/2016   RLS (restless legs syndrome) 04/17/2016   GERD (gastroesophageal reflux disease) 03/11/2016   History of hyperparathyroidism 03/11/2016   History of gastric ulcer 03/11/2016   Essential hypertension 04/24/2015   Primary osteoarthritis involving multiple joints 04/24/2015   Lumbago 04/24/2015   Family history of colonic polyps     Past Surgical History:  Procedure Laterality Date   CARPAL TUNNEL RELEASE Bilateral 2000   CATARACT EXTRACTION W/PHACO Left 04/18/2020   Procedure: CATARACT EXTRACTION PHACO AND INTRAOCULAR LENS PLACEMENT (IOC) LEFT 4.78  00:32.8;  Surgeon: Birder Robson, MD;  Location: [place];  Service: Ophthalmology;  Laterality: Left;   CATARACT EXTRACTION W/PHACO Right 05/09/2020   Procedure: CATARACT EXTRACTION PHACO AND INTRAOCULAR LENS PLACEMENT (IOC) RIGHT 3.41 00:21.8;  Surgeon: Birder Robson, MD;  Location: [place];  Service: Ophthalmology;  Laterality: Right;   COLONOSCOPY  06/17/2012   A few two mm ulcers were found in the sigmoid colon, no bleeding present,bx taken-path reporet on the few tiny ulcers ,non specific   COLONOSCOPY WITH PROPOFOL N/A 06/11/2017   Procedure: COLONOSCOPY WITH PROPOFOL;  Surgeon: Christene Lye, MD;  Location: [facility];  Service: Endoscopy;  Laterality: N/A;   parathyroid surgery  2008   TEMPOROMANDIBULAR JOINT ARTHROPLASTY Left 1984   1989,1990-bilateral   TUBAL LIGATION      Family History  Adopted: Yes  Problem Relation Age  of Onset   Kidney disease Mother    Ovarian cancer Mother    Hypertension Father    Stroke Father    Lupus Sister    Breast cancer Maternal Grandmother    Breast cancer Cousin        pat cousins    Social History   Socioeconomic History   Marital status: Married    Spouse name: Nikala Walsworth    Number of children: 1   Years of education: Not on file   Highest education level: Some college, no  degree  Occupational History   Not on file  Tobacco Use   Smoking status: Never   Smokeless tobacco: Never  Vaping Use   Vaping Use: Never used  Substance and Sexual Activity   Alcohol use: Yes    Comment: wine   Drug use: No   Sexual activity: Yes    Partners: Male    Birth control/protection: Post-menopausal  Other Topics Concern   Not on file  Social History Narrative   Married, retired, involved in church and has one grown son    Social Determinants of Radio broadcast assistant Strain: [place]  (07/08/2022)   Overall Financial Resource Strain (CARDIA)    Difficulty of Paying Living Expenses: Not hard at all  Food Insecurity: No Food Insecurity (07/08/2022)   Hunger Vital Sign    Worried About Running Out of Food in the Last Year: Never true    [place] in the Last Year: Never true  Transportation Needs: No Transportation Needs (07/08/2022)   PRAPARE - Hydrologist (Medical): No    Lack of Transportation (Non-Medical): No  Physical Activity: Inactive (07/08/2022)   Exercise Vital Sign    Days of Exercise per Week: 0 days    Minutes of Exercise per Session: 0 min  Stress: No Stress Concern Present (07/08/2022)   [place]    Feeling of Stress : Not at all  Social Connections: [place] (07/08/2022)   Social Connection and Isolation Panel [NHANES]    Frequency of Communication with Friends and Family: More than three times a week    Frequency of  Social Gatherings with Friends and Family: Three times a week    Attends Religious Services: More than 4 times per year    Active Member of Clubs or Organizations: Yes    Attends Archivist Meetings: More than 4 times per year    Marital Status: Married  Human resources officer Violence: Not At Risk (07/08/2022)   Humiliation, Afraid, Rape, and Kick questionnaire    Fear of Current or Ex-Partner: No    Emotionally Abused: No    Physically Abused: No    Sexually Abused: No     Current Outpatient Medications:    acetaminophen (TYLENOL) 500 MG tablet, Take 1-2 tablets (500-1,000 mg total) by mouth every 8 (eight) hours as needed., Disp: 100 tablet, Rfl: 2   albuterol (VENTOLIN HFA) 108 (90 Base) MCG/ACT inhaler, Inhale 2 puffs into the lungs every 6 (six) hours as needed for wheezing or shortness of breath., Disp: 1 each, Rfl: 0   Ascorbic Acid (VITAMIN C) 1000 MG tablet, Take 1,000 mg by mouth., Disp: , Rfl:    Calcium-Vitamin D 600-200 MG-UNIT tablet, Take 1 tablet by mouth 2 (two) times daily. , Disp: , Rfl:    Cholecalciferol (VITAMIN D3) 1000 units CAPS, Take by mouth., Disp: , Rfl:    Coenzyme Q10 (COQ10) 200 MG CAPS, Take 200 mg by mouth daily., Disp: , Rfl:    cyanocobalamin (VITAMIN B12) 1000 MCG tablet, Take 1,000 mcg by mouth daily., Disp: , Rfl:    Garlic 1610 MG CAPS, Take 1,000 mg by mouth. , Disp: , Rfl:    L-Lysine 500 MG TABS, Take by mouth., Disp: , Rfl:    levocetirizine (XYZAL) 5 MG tablet, Take 1 tablet (5 mg total) by mouth every evening., Disp: 90 tablet, Rfl: 0   magnesium oxide (MAG-OX) 400 MG tablet,  Take 500 mg by mouth daily. , Disp: , Rfl:    montelukast (SINGULAIR) 10 MG tablet, Take 1 tablet (10 mg total) by mouth at bedtime., Disp: 90 tablet, Rfl: 1   Nutritional Supplements (JOINT FORMULA PO), Take 2 tablets by mouth daily. glucosamine, Disp: , Rfl:    olmesartan-hydrochlorothiazide (BENICAR HCT) 40-25 MG tablet, Take 1 tablet by mouth daily., Disp: 90  tablet, Rfl: 1   omeprazole (PRILOSEC) 40 MG capsule, Take 1 capsule (40 mg total) by mouth daily., Disp: 90 capsule, Rfl: 1   Potassium Gluconate 2.5 MEQ TABS, Take 1 tablet by mouth. Pt taking 1 tab 99 mg daily, Disp: , Rfl:    pravastatin (PRAVACHOL) 20 MG tablet, Take 1 tablet (20 mg total) by mouth daily., Disp: 90 tablet, Rfl: 1   rOPINIRole (REQUIP) 1 MG tablet, Take 1 tablet (1 mg total) by mouth at bedtime., Disp: 90 tablet, Rfl: 1   Cod Liver Oil 1000 MG CAPS, Take by mouth. (Patient not taking: Reported on 07/08/2022), Disp: , Rfl:    fluticasone-salmeterol (ADVAIR HFA) 230-21 MCG/ACT inhaler, Inhale 2 puffs into the lungs 2 (two) times daily. (Patient not taking: Reported on 07/08/2022), Disp: 8 g, Rfl: 1  Allergies  Allergen Reactions   Ace Inhibitors Cough   Aspirin Nausea Only    Abdominal pain   Crestor [Rosuvastatin]     Muscle pain      ROS  Constitutional: Negative for fever or weight change.  Respiratory: Negative for cough and shortness of breath.   Cardiovascular: Negative for chest pain or palpitations.  Gastrointestinal: Negative for abdominal pain, no bowel changes.  Musculoskeletal: Negative for gait problem or joint swelling.  Skin: Negative for rash.  Neurological: Negative for dizziness or headache.  No other specific complaints in a complete review of systems (except as listed in HPI above).   Objective  Vitals:   07/08/22 1024  BP: 136/70  Pulse: 86  Resp: 16  SpO2: 98%  Weight: 143 lb (64.9 kg)  Height: 5' (1.524 m)    Body mass index is 27.93 kg/m.  Physical Exam  Constitutional: Patient appears well-developed and well-nourished. No distress.  HENT: Head: Normocephalic and atraumatic. Ears: B TMs ok, no erythema or effusion; Nose: Nose normal. Mouth/Throat: Oropharynx is clear and moist. No oropharyngeal exudate.  Eyes: Conjunctivae and EOM are normal. Pupils are equal, round, and reactive to light. No scleral icterus.  Neck: Normal  range of motion. Neck supple. No JVD present. No thyromegaly present.  Cardiovascular: Normal rate, regular rhythm and normal heart sounds.  No murmur heard. No BLE edema. Pulmonary/Chest: Effort normal and breath sounds normal. No respiratory distress. Abdominal: Soft. Bowel sounds are normal, no distension. There is no tenderness. no masses Breast: no lumps or masses, no nipple discharge or rashes FEMALE GENITALIA:  Not done  RECTAL: not done  Musculoskeletal: Normal range of motion, no joint effusions. No gross deformities Neurological: he is alert and oriented to person, place, and time. No cranial nerve deficit. Coordination, balance, strength, speech and gait are normal.  Skin: Skin is warm and dry. No rash noted. No erythema.  Psychiatric: Patient has a normal mood and affect. behavior is normal. Judgment and thought content normal.   Fall Risk:    07/08/2022   10:23 AM 04/29/2022   11:14 AM 01/08/2022    3:07 PM 12/26/2021   10:03 AM 12/04/2021    9:42 AM  Fall Risk   Falls in the past year? 1 0 0  0 0  Number falls in past yr: 0 0 0 0 0  Injury with Fall? 1 0 0 0 0  Risk for fall due to : No Fall Risks No Fall Risks  No Fall Risks No Fall Risks  Follow up Falls prevention discussed Falls prevention discussed;Education provided Falls evaluation completed Falls prevention discussed Falls prevention discussed     Functional Status Survey: Is the patient deaf or have difficulty hearing?: No Does the patient have difficulty seeing, even when wearing glasses/contacts?: No Does the patient have difficulty concentrating, remembering, or making decisions?: No Does the patient have difficulty walking or climbing stairs?: No Does the patient have difficulty dressing or bathing?: No Does the patient have difficulty doing errands alone such as visiting a doctor's office or shopping?: No   Assessment & Plan  1. Well adult exam   2. Need for Tdap vaccination  - Tdap (ADACEL) 01-22-14.5  LF-MCG/0.5 injection; Inject 0.5 mLs into the muscle once for 1 dose.  Dispense: 0.5 mL; Refill: 0  3. Colon cancer screening  - Ambulatory referral to Gastroenterology - we will check if due for repeat, note from colonoscopy said normal but repeat in 5 years   -USPSTF grade A and B recommendations reviewed with patient; age-appropriate recommendations, preventive care, screening tests, etc discussed and encouraged; healthy living encouraged; see AVS for patient education given to patient -Discussed importance of 150 minutes of physical activity weekly, eat two servings of fish weekly, eat one serving of tree nuts ( cashews, pistachios, pecans, almonds.Marland Kitchen) every other day, eat 6 servings of fruit/vegetables daily and drink plenty of water and avoid sweet beverages.   -Reviewed Health Maintenance: Yes.

## 2022-07-08 ENCOUNTER — Encounter: Payer: Self-pay | Admitting: Family Medicine

## 2022-07-08 ENCOUNTER — Ambulatory Visit (INDEPENDENT_AMBULATORY_CARE_PROVIDER_SITE_OTHER): Payer: Medicare HMO | Admitting: Family Medicine

## 2022-07-08 VITALS — BP 136/70 | HR 86 | Resp 16 | Ht 60.0 in | Wt 143.0 lb

## 2022-07-08 DIAGNOSIS — Z0000 Encounter for general adult medical examination without abnormal findings: Secondary | ICD-10-CM

## 2022-07-08 DIAGNOSIS — Z1211 Encounter for screening for malignant neoplasm of colon: Secondary | ICD-10-CM

## 2022-07-08 DIAGNOSIS — Z23 Encounter for immunization: Secondary | ICD-10-CM

## 2022-07-08 MED ORDER — TETANUS-DIPHTH-ACELL PERTUSSIS 5-2-15.5 LF-MCG/0.5 IM SUSP: 0.5000 mL | Freq: Once | INTRAMUSCULAR | 0 refills | Status: AC

## 2022-07-09 ENCOUNTER — Telehealth: Payer: Self-pay

## 2022-07-09 NOTE — Telephone Encounter (Signed)
I recommend screening colonoscopy because looking at the images from her colonoscopy in 2018, there was liquid brown stool in the cecum.  RV

## 2022-07-09 NOTE — Telephone Encounter (Signed)
Patient returned call to schedule her colonoscopy.  She stated that her last colonoscopy was performed in 2018 with Dr. Jamal Collin.  Colonoscopy was normal.  Colonoscopy is in Epic.  Dr. Jamal Collin noted repeat colonoscopy in 5 years.  Pt thought she did not have to have them anymore.  I informed her that the colonoscopy did note that she has a 1st degree family member with history colon polyps.   She stated that her twin had colon polyps.  There is no family history of colon cancer.  Please advise as to if patient needs to have a colonoscopy.  Thank you,  Sharyn Lull, CMA

## 2022-07-09 NOTE — Telephone Encounter (Signed)
Per Dr. Marius Ditch- a voice message has been left for pt informing her that Dr. recommend screening colonoscopy because looking at the images from her colonoscopy in 2018, there was liquid brown stool in the cecum. Asked pt to call office back to schedule.   Thanks,  [place], [place]

## 2022-07-11 ENCOUNTER — Other Ambulatory Visit: Payer: Self-pay

## 2022-07-11 ENCOUNTER — Telehealth: Payer: Self-pay

## 2022-07-11 DIAGNOSIS — Z1211 Encounter for screening for malignant neoplasm of colon: Secondary | ICD-10-CM

## 2022-07-11 MED ORDER — NA SULFATE-K SULFATE-MG SULF 17.5-3.13-1.6 GM/177ML PO SOLN: 1.0000 | Freq: Once | ORAL | 0 refills | Status: AC

## 2022-07-11 MED ORDER — GOLYTELY 236 G PO SOLR: 4000.0000 mL | Freq: Once | ORAL | 0 refills | Status: AC

## 2022-07-11 NOTE — Telephone Encounter (Signed)
Gastroenterology Pre-Procedure Review  Request Date: 08/08/22 Requesting Physician: Dr. Marius Ditch  PATIENT REVIEW QUESTIONS: The patient responded to the following health history questions as indicated:    1. Are you having any GI issues?  Heartburn occasionally 2. Do you have a personal history of Polyps? no 3. Do you have a family history of Colon Cancer or Polyps? yes (twin had colon polyps) 4. Diabetes Mellitus? no 5. Joint replacements in the past 12 months?no 6. Major health problems in the past 3 months?no 7. Any artificial heart valves, MVP, or defibrillator?no    MEDICATIONS & ALLERGIES:    Patient reports the following regarding taking any anticoagulation/antiplatelet therapy:   Plavix, Coumadin, Eliquis, Xarelto, Lovenox, Pradaxa, Brilinta, or Effient? no Aspirin? no  Patient confirms/reports the following medications:  Current Outpatient Medications  Medication Sig Dispense Refill   acetaminophen (TYLENOL) 500 MG tablet Take 1-2 tablets (500-1,000 mg total) by mouth every 8 (eight) hours as needed. 100 tablet 2   albuterol (VENTOLIN HFA) 108 (90 Base) MCG/ACT inhaler Inhale 2 puffs into the lungs every 6 (six) hours as needed for wheezing or shortness of breath. 1 each 0   Ascorbic Acid (VITAMIN C) 1000 MG tablet Take 1,000 mg by mouth.     Calcium-Vitamin D 600-200 MG-UNIT tablet Take 1 tablet by mouth 2 (two) times daily.      Cholecalciferol (VITAMIN D3) 1000 units CAPS Take by mouth.     Cod Liver Oil 1000 MG CAPS Take by mouth. (Patient not taking: Reported on 07/08/2022)     Coenzyme Q10 (COQ10) 200 MG CAPS Take 200 mg by mouth daily.     cyanocobalamin (VITAMIN B12) 1000 MCG tablet Take 1,000 mcg by mouth daily.     fluticasone-salmeterol (ADVAIR HFA) 230-21 MCG/ACT inhaler Inhale 2 puffs into the lungs 2 (two) times daily. (Patient not taking: Reported on 35/57/3220) 8 g 1   Garlic 2542 MG CAPS Take 1,000 mg by mouth.      L-Lysine 500 MG TABS Take by mouth.      levocetirizine (XYZAL) 5 MG tablet Take 1 tablet (5 mg total) by mouth every evening. 90 tablet 0   magnesium oxide (MAG-OX) 400 MG tablet Take 500 mg by mouth daily.      montelukast (SINGULAIR) 10 MG tablet Take 1 tablet (10 mg total) by mouth at bedtime. 90 tablet 1   Nutritional Supplements (JOINT FORMULA PO) Take 2 tablets by mouth daily. glucosamine     olmesartan-hydrochlorothiazide (BENICAR HCT) 40-25 MG tablet Take 1 tablet by mouth daily. 90 tablet 1   omeprazole (PRILOSEC) 40 MG capsule Take 1 capsule (40 mg total) by mouth daily. 90 capsule 1   Potassium Gluconate 2.5 MEQ TABS Take 1 tablet by mouth. Pt taking 1 tab 99 mg daily     pravastatin (PRAVACHOL) 20 MG tablet Take 1 tablet (20 mg total) by mouth daily. 90 tablet 1   rOPINIRole (REQUIP) 1 MG tablet Take 1 tablet (1 mg total) by mouth at bedtime. 90 tablet 1   No current facility-administered medications for this visit.    Patient confirms/reports the following allergies:  Allergies  Allergen Reactions   Ace Inhibitors Cough   Aspirin Nausea Only    Abdominal pain   Crestor [Rosuvastatin]     Muscle pain     No orders of the defined types were placed in this encounter.   AUTHORIZATION INFORMATION Primary Insurance: 1D#: Group #:  Secondary Insurance: 1D#: Group #:  SCHEDULE INFORMATION: Date: 08/08/22  Time: Location: [facility]

## 2022-07-11 NOTE — Telephone Encounter (Signed)
Patient contacted office stated her Suprep rx was going to cost $87.  Patient has been informed that I will send over an alternative prep-Golytely.  Patient has been given the following instructions for Golytely: Fill Golytely container to the fill line with clear liquid. Mix well. Drink 8 oz every 15-30 mins until entire contents have been completed.  Do not eat or drink anything 4 hours prior to colonoscopy.  Patient verbalized understanding of Golytely directions.    Thanks, [place], [place]

## 2022-07-30 ENCOUNTER — Other Ambulatory Visit: Payer: Self-pay

## 2022-07-30 ENCOUNTER — Encounter: Payer: Self-pay | Admitting: Gastroenterology

## 2022-08-01 ENCOUNTER — Other Ambulatory Visit
Admission: RE | Admit: 2022-08-01 | Discharge: 2022-08-01 | Disposition: A | Discharge status: Home / Self Care | Payer: Medicare HMO | Attending: Urgent Care | Admitting: Urgent Care

## 2022-08-01 DIAGNOSIS — Z01812 Encounter for preprocedural laboratory examination: Secondary | ICD-10-CM | POA: Diagnosis not present

## 2022-08-01 DIAGNOSIS — Z79899 Other long term (current) drug therapy: Secondary | ICD-10-CM | POA: Diagnosis not present

## 2022-08-01 LAB — POTASSIUM: Potassium: 3.4 mmol/L — ABNORMAL LOW (ref 3.5–5.1)

## 2022-08-07 ENCOUNTER — Telehealth: Payer: Self-pay | Admitting: Gastroenterology

## 2022-08-07 NOTE — Telephone Encounter (Signed)
Patient has a question about her bowel prep. Please call patient as soon as possible.

## 2022-08-07 NOTE — Anesthesia Preprocedure Evaluation (Signed)
Anesthesia Evaluation  Patient identified by MRN, date of birth, ID band Patient awake    Reviewed: Allergy & Precautions, NPO status , Patient's Chart, lab work & pertinent test results  History of Anesthesia Complications Negative for: history of anesthetic complications  Airway Mallampati: III  TM Distance: >3 FB Neck ROM: full    Dental  (+) Chipped,  Bilat TMJ:   Pulmonary asthma    Pulmonary exam normal breath sounds clear to auscultation       Cardiovascular Exercise Tolerance: Good hypertension, negative cardio ROS Normal cardiovascular exam Rhythm:Regular Rate:Normal     Neuro/Psych Neuropathy feet;  Mild Scoliosis;  Neuromuscular disease  negative psych ROS   GI/Hepatic negative GI ROS, Neg liver ROS,  Controlled,,  Endo/Other  negative endocrine ROS    Renal/GU negative Renal ROS  negative genitourinary   Musculoskeletal  (+) Arthritis ,    Abdominal Normal abdominal exam  (+) - obese  Peds  Hematology negative hematology ROS (+)   Anesthesia Other Findings Past Medical History: No date: Allergy     Comment:  mycins; cause diarrhea No date: Arthritis     Comment:  since 2007/ back No date: Asthma     Comment:  allergy related/ worse in spring No date: Breast screening, unspecified No date: Family history of colonic polyps No date: GERD (gastroesophageal reflux disease) No date: Headache     Comment:  every morning 1987: History of cystitis No date: Hypertension     Comment:  since 2005 No date: Neuropathy     Comment:  feet and legs/  nerve damage from a fall No date: Osteoarthritis 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)     Comment:  surgery No date: Restless leg syndrome No date: Scoliosis No date: Sleep apnea     Comment:  CPAP used for a year and not now/ after official test               told she didnt have it / test negative No date: Trigger finger of right hand 1966: Ulcer No date:  Vertigo     Comment:  hx of  Past Surgical History: 2000: CARPAL TUNNEL RELEASE; Bilateral 04/18/2020: CATARACT EXTRACTION W/PHACO; Left     Comment:  Procedure: CATARACT EXTRACTION PHACO AND INTRAOCULAR               LENS PLACEMENT (IOC) LEFT 4.78  00:32.8;  Surgeon:               Galen Manila, MD;  Location: [facility];                Service: Ophthalmology;  Laterality: Left; 05/09/2020: CATARACT EXTRACTION W/PHACO; Right     Comment:  Procedure: CATARACT EXTRACTION PHACO AND INTRAOCULAR               LENS PLACEMENT (IOC) RIGHT 3.41 00:21.8;  Surgeon:               Galen Manila, MD;  Location: [facility];                Service: Ophthalmology;  Laterality: Right; 06/17/2012: COLONOSCOPY     Comment:  A few two mm ulcers were found in the sigmoid colon, no               bleeding present,bx taken-path reporet on the few tiny               ulcers ,non specific 06/11/2017: COLONOSCOPY WITH PROPOFOL; N/A  Comment:  Procedure: COLONOSCOPY WITH PROPOFOL;  Surgeon: Kieth Brightly, MD;  Location: [facility];  Service:               Endoscopy;  Laterality: N/A; 2008: parathyroid surgery 1984: TEMPOROMANDIBULAR JOINT ARTHROPLASTY; Left     Comment:  1989,1990-bilateral No date: TUBAL LIGATION  BMI    Body Mass Index: 27.34 kg/m      Reproductive/Obstetrics negative OB ROS                             Anesthesia Physical Anesthesia Plan  ASA: 2  Anesthesia Plan: General   Post-op Pain Management:    Induction:   PONV Risk Score and Plan: 1 and 2 and Propofol infusion and TIVA  Airway Management Planned: Nasal Cannula  Additional Equipment:   Intra-op Plan:   Post-operative Plan:   Informed Consent:      Dental Advisory Given  Plan Discussed with: Anesthesiologist, CRNA and Surgeon  Anesthesia Plan Comments:         Anesthesia Quick Evaluation  Patient Active Problem List    Diagnosis Date Noted  . Senile purpura (HCC) 04/29/2022  . Peripheral polyneuropathy 04/29/2022  . Asthma, well controlled 04/29/2022  . Moderate persistent asthma with acute exacerbation 12/26/2021  . OSA on CPAP 04/15/2019  . B12 deficiency 12/09/2018  . Dyslipidemia 11/08/2017  . Vitamin D deficiency 10/31/2016  . Osteopenia of left hip 10/31/2016  . 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  . BPV (benign positional vertigo) 04/17/2016  . RLS (restless legs syndrome) 04/17/2016  . GERD (gastroesophageal reflux disease) 03/11/2016  . History of hyperparathyroidism 03/11/2016  . History of gastric ulcer 03/11/2016  . Essential hypertension 04/24/2015  . Primary osteoarthritis involving multiple joints 04/24/2015  . Lumbago 04/24/2015  . Family history of colonic polyps        Latest Ref Rng & Units 10/29/2021    8:48 AM 11/24/2020    9:39 AM 10/18/2019   10:27 AM  CBC  WBC 3.8 - 10.8 Thousand/uL 6.4  7.4  7.7   Hemoglobin 11.7 - 15.5 g/dL 09.4  07.6  80.8   Hematocrit 35.0 - 45.0 % 39.5  39.5  40.3   Platelets 140 - 400 Thousand/uL 277  266  281       Latest Ref Rng & Units 08/01/2022   11:24 AM 10/29/2021    8:48 AM 11/24/2020    9:39 AM  BMP  Glucose 65 - 99 mg/dL  88  84   BUN 7 - 25 mg/dL  14  13   Creatinine 8.11 - 1.00 mg/dL  0.31  5.94   BUN/Creat Ratio 6 - 22 (calc)  NOT APPLICABLE  NOT APPLICABLE   Sodium 135 - 146 mmol/L  143  142   Potassium 3.5 - 5.1 mmol/L 3.4  4.1  3.8   Chloride 98 - 110 mmol/L  103  105   CO2 20 - 32 mmol/L  32  27   Calcium 8.6 - 10.4 mg/dL  58.5  92.9     Risks and benefits of anesthesia discussed at length, patient or surrogate demonstrates understanding. Appropriately NPO. Plan to proceed with anesthesia.  Corlis Leak, MD 08/07/22

## 2022-08-07 NOTE — Telephone Encounter (Signed)
Patient call returned.  Patient has been advised to mix her bowel prep Golytely at 5pm with Clear Liquid.  Drink 8 oz every 30 mins until half has been completed.  Continue with Clear Liquid Diet.  5 hours before procedure time resume drinking the remainder of Golytely prep-drinking 8 oz every 30 mins until entire contents have been completed.  Do not eat or drink anything 2 hours prior to colonoscopy.  Patient verbalized understanding.  I will send this to her via mychart as well.  Thanks,  [place], [place]

## 2022-08-08 ENCOUNTER — Encounter
Admission: RE | Disposition: A | Discharge status: Home / Self Care | Payer: Self-pay | Source: Home / Self Care | Attending: Gastroenterology

## 2022-08-08 ENCOUNTER — Encounter: Payer: Self-pay | Admitting: Gastroenterology

## 2022-08-08 ENCOUNTER — Other Ambulatory Visit: Payer: Self-pay

## 2022-08-08 ENCOUNTER — Ambulatory Visit
Admission: RE | Admit: 2022-08-08 | Discharge: 2022-08-08 | Disposition: A | Discharge status: Home / Self Care | Payer: Medicare HMO | Attending: Gastroenterology | Admitting: Gastroenterology

## 2022-08-08 ENCOUNTER — Ambulatory Visit: Payer: Medicare HMO | Admitting: Anesthesiology

## 2022-08-08 DIAGNOSIS — M1990 Unspecified osteoarthritis, unspecified site: Secondary | ICD-10-CM | POA: Diagnosis not present

## 2022-08-08 DIAGNOSIS — M26603 Bilateral temporomandibular joint disorder, unspecified: Secondary | ICD-10-CM | POA: Insufficient documentation

## 2022-08-08 DIAGNOSIS — Z01812 Encounter for preprocedural laboratory examination: Secondary | ICD-10-CM

## 2022-08-08 DIAGNOSIS — G709 Myoneural disorder, unspecified: Secondary | ICD-10-CM | POA: Insufficient documentation

## 2022-08-08 DIAGNOSIS — J45909 Unspecified asthma, uncomplicated: Secondary | ICD-10-CM | POA: Insufficient documentation

## 2022-08-08 DIAGNOSIS — G629 Polyneuropathy, unspecified: Secondary | ICD-10-CM | POA: Insufficient documentation

## 2022-08-08 DIAGNOSIS — M419 Scoliosis, unspecified: Secondary | ICD-10-CM | POA: Insufficient documentation

## 2022-08-08 DIAGNOSIS — Z1211 Encounter for screening for malignant neoplasm of colon: Secondary | ICD-10-CM | POA: Insufficient documentation

## 2022-08-08 DIAGNOSIS — Z79899 Other long term (current) drug therapy: Secondary | ICD-10-CM

## 2022-08-08 HISTORY — DX: Trigger finger, unspecified finger: M65.30

## 2022-08-08 HISTORY — DX: Dizziness and giddiness: R42

## 2022-08-08 HISTORY — DX: Headache, unspecified: R51.9

## 2022-08-08 HISTORY — PX: COLONOSCOPY WITH PROPOFOL: SHX5780

## 2022-08-08 HISTORY — DX: Unspecified osteoarthritis, unspecified site: M19.90

## 2022-08-08 SURGERY — COLONOSCOPY WITH PROPOFOL
Anesthesia: General | Site: Rectum

## 2022-08-08 MED ORDER — SODIUM CHLORIDE 0.9 % IV SOLN: INTRAVENOUS | Status: DC

## 2022-08-08 MED ORDER — LACTATED RINGERS IV SOLN: INTRAVENOUS | Status: DC

## 2022-08-08 MED ORDER — STERILE WATER FOR IRRIGATION IR SOLN
Status: DC | PRN
  Administered 2022-08-08: 100 mL

## 2022-08-08 MED ORDER — PROPOFOL 10 MG/ML IV BOLUS
INTRAVENOUS | Status: DC | PRN
  Administered 2022-08-08: 80 mg via INTRAVENOUS
  Administered 2022-08-08: 20 mg via INTRAVENOUS
  Administered 2022-08-08: 30 mg via INTRAVENOUS
  Administered 2022-08-08: 40 mg via INTRAVENOUS

## 2022-08-08 MED ORDER — LIDOCAINE HCL (CARDIAC) PF 100 MG/5ML IV SOSY
PREFILLED_SYRINGE | INTRAVENOUS | Status: DC | PRN
  Administered 2022-08-08: 40 mg via INTRAVENOUS

## 2022-08-08 SURGICAL SUPPLY — 6 items
GOWN CVR UNV OPN BCK APRN NK (MISCELLANEOUS) ×2 IMPLANT
GOWN ISOL THUMB LOOP REG UNIV (MISCELLANEOUS) ×2
KIT PRC NS LF DISP ENDO (KITS) ×1 IMPLANT
KIT PROCEDURE OLYMPUS (KITS) ×1
MANIFOLD NEPTUNE II (INSTRUMENTS) ×1 IMPLANT
WATER STERILE IRR 250ML POUR (IV SOLUTION) ×1 IMPLANT

## 2022-08-08 NOTE — H&P (Signed)
Arlyss Repress, MD [address]  [address]  [place], [place] [address]  Main: [phone]  Fax: [phone] Pager: [phone]  Primary Care Physician:  Alba Cory, MD Primary Gastroenterologist:  Dr. Arlyss Repress  Pre-Procedure History & Physical: HPI:  Ruth [name] is a 76 y.o. female is here for an colonoscopy.   Past Medical History:  Diagnosis Date   Allergy    mycins; cause diarrhea   Arthritis    since 2007/ back   Asthma    allergy related/ worse in spring   Breast screening, unspecified    Family history of colonic polyps    GERD (gastroesophageal reflux disease)    Headache    every morning   History of cystitis 1987   Hypertension    since 2005   Neuropathy    feet and legs/  nerve damage from a fall   Osteoarthritis    Parathyroid disorder (HCC) 2008   surgery   Restless leg syndrome    Scoliosis    Sleep apnea    CPAP used for a year and not now/ after official test told she didnt have it / test negative   Trigger finger of right hand    Ulcer 1966   Vertigo    hx of    Past Surgical History:  Procedure Laterality Date   CARPAL TUNNEL RELEASE Bilateral 2000   CATARACT EXTRACTION W/PHACO Left 04/18/2020   Procedure: CATARACT EXTRACTION PHACO AND INTRAOCULAR LENS PLACEMENT (IOC) LEFT 4.78  00:32.8;  Surgeon: Galen Manila, MD;  Location: [facility];  Service: Ophthalmology;  Laterality: Left;   CATARACT EXTRACTION W/PHACO Right 05/09/2020   Procedure: CATARACT EXTRACTION PHACO AND INTRAOCULAR LENS PLACEMENT (IOC) RIGHT 3.41 00:21.8;  Surgeon: Galen Manila, MD;  Location: [facility];  Service: Ophthalmology;  Laterality: Right;   COLONOSCOPY  06/17/2012   A few two mm ulcers were found in the sigmoid colon, no bleeding present,bx taken-path reporet on the few tiny ulcers ,non specific   COLONOSCOPY WITH PROPOFOL N/A 06/11/2017   Procedure: COLONOSCOPY WITH PROPOFOL;  Surgeon: Kieth Brightly, MD;   Location: [facility];  Service: Endoscopy;  Laterality: N/A;   parathyroid surgery  2008   TEMPOROMANDIBULAR JOINT ARTHROPLASTY Left 1984   1989,1990-bilateral   TUBAL LIGATION      Prior to Admission medications   Medication Sig Start Date End Date Taking? Authorizing Provider  acetaminophen (TYLENOL) 500 MG tablet Take 1-2 tablets (500-1,000 mg total) by mouth every 8 (eight) hours as needed. 10/31/16  Yes Alba Cory, MD  Calcium-Vitamin D 600-200 MG-UNIT tablet Take 1 tablet by mouth 2 (two) times daily.    Yes [provider]  Cholecalciferol (VITAMIN D3) 1000 units CAPS Take by mouth.   Yes [provider]  Cod Liver Oil 1000 MG CAPS Take by mouth.   Yes [provider]  Coenzyme Q10 (COQ10) 200 MG CAPS Take 200 mg by mouth daily.   Yes [provider]  fluticasone furoate-vilanterol (BREO ELLIPTA) 200-25 MCG/ACT AEPB Inhale 1 puff into the lungs daily.   Yes [provider]  Garlic 1000 MG CAPS Take 1,000 mg by mouth.    Yes [provider]  L-Lysine 500 MG TABS Take by mouth.   Yes [provider]  levocetirizine (XYZAL) 5 MG tablet Take 1 tablet (5 mg total) by mouth every evening. 08/16/20  Yes Sowles, Danna Hefty, MD  magnesium oxide (MAG-OX) 400 MG tablet Take 500 mg by mouth daily.  Yes [provider]  montelukast (SINGULAIR) 10 MG tablet Take 1 tablet (10 mg total) by mouth at bedtime. 04/29/22  Yes Sowles, Danna Hefty, MD  Nutritional Supplements (JOINT FORMULA PO) Take 2 tablets by mouth daily. glucosamine   Yes [provider]  olmesartan-hydrochlorothiazide (BENICAR HCT) 40-25 MG tablet Take 1 tablet by mouth daily. 04/29/22  Yes Sowles, Danna Hefty, MD  omeprazole (PRILOSEC) 40 MG capsule Take 1 capsule (40 mg total) by mouth daily. 04/29/22  Yes Sowles, Danna Hefty, MD  Potassium Gluconate 2.5 MEQ TABS Take 1 tablet by mouth. Pt taking 1 tab 99 mg daily   Yes [provider]  pravastatin  (PRAVACHOL) 20 MG tablet Take 1 tablet (20 mg total) by mouth daily. 04/29/22  Yes Sowles, Danna Hefty, MD  rOPINIRole (REQUIP) 1 MG tablet Take 1 tablet (1 mg total) by mouth at bedtime. 04/29/22  Yes Sowles, Danna Hefty, MD  albuterol (VENTOLIN HFA) 108 (90 Base) MCG/ACT inhaler Inhale 2 puffs into the lungs every 6 (six) hours as needed for wheezing or shortness of breath. Patient not taking: Reported on 07/30/2022 06/15/20   Alba Cory, MD  Ascorbic Acid (VITAMIN C) 1000 MG tablet Take 1,000 mg by mouth. Patient not taking: Reported on 07/30/2022    [provider]  cyanocobalamin (VITAMIN B12) 1000 MCG tablet Take 1,000 mcg by mouth daily. Patient not taking: Reported on 07/30/2022    [provider]  fluticasone-salmeterol (ADVAIR HFA) 230-21 MCG/ACT inhaler Inhale 2 puffs into the lungs 2 (two) times daily. Patient not taking: Reported on 07/30/2022 12/26/21   Mecum, Erin E, PA-C    Allergies as of 07/11/2022 - Review Complete 07/11/2022  Allergen Reaction Noted   Ace inhibitors Cough 06/18/2016   Aspirin Nausea Only 10/05/2015   Crestor [rosuvastatin]  08/15/2020    Family History  Adopted: Yes  Problem Relation Age of Onset   Kidney disease Mother    Ovarian cancer Mother    Hypertension Father    Stroke Father    Lupus Sister    Breast cancer Maternal Grandmother    Breast cancer Cousin        pat cousins    Social History   Socioeconomic History   Marital status: Married    Spouse name: Adelynn Gipe    Number of children: 1   Years of education: Not on file   Highest education level: Some college, no degree  Occupational History   Not on file  Tobacco Use   Smoking status: Never   Smokeless tobacco: Never  Vaping Use   Vaping Use: Never used  Substance and Sexual Activity   Alcohol use: Yes    Alcohol/week: 3.0 standard drinks of alcohol    Types: 3 Glasses of wine per week    Comment: wine   Drug use: No   Sexual activity: Yes    Partners:  Male    Birth control/protection: Post-menopausal  Other Topics Concern   Not on file  Social History Narrative   Married, retired, involved in church and has one grown son    Social Determinants of Corporate investment banker Strain: Low Risk  (07/08/2022)   Overall Financial Resource Strain (CARDIA)    Difficulty of Paying Living Expenses: Not hard at all  Food Insecurity: No Food Insecurity (07/08/2022)   Hunger Vital Sign    Worried About Running Out of Food in the Last Year: Never true    Ran Out of Food in the Last Year: Never true  Transportation Needs: No Transportation Needs (07/08/2022)   PRAPARE - Administrator, Civil Service (Medical): No    Lack of Transportation (Non-Medical): No  Physical Activity: Inactive (07/08/2022)   Exercise Vital Sign    Days of Exercise per Week: 0 days    Minutes of Exercise per Session: 0 min  Stress: No Stress Concern Present (07/08/2022)   [employer] of Occupational Health - Occupational Stress Questionnaire    Feeling of Stress : Not at all  Social Connections: Socially Integrated (07/08/2022)   Social Connection and Isolation Panel [NHANES]    Frequency of Communication with Friends and Family: More than three times a week    Frequency of Social Gatherings with Friends and Family: Three times a week    Attends Religious Services: More than 4 times per year    Active Member of Clubs or Organizations: Yes    Attends Banker Meetings: More than 4 times per year    Marital Status: Married  Catering manager Violence: Not At Risk (07/08/2022)   Humiliation, Afraid, Rape, and Kick questionnaire    Fear of Current or Ex-Partner: No    Emotionally Abused: No    Physically Abused: No    Sexually Abused: No    Review of Systems: See HPI, otherwise negative ROS  Physical Exam: BP (!) 157/74   Temp 99.2 F (37.3 C) (Tympanic)   Ht 5' (1.524 m)   Wt 62.7 kg   SpO2 98%   BMI 26.99 kg/m  General:    Alert,  pleasant and cooperative in NAD Head:  Normocephalic and atraumatic. Neck:  Supple; no masses or thyromegaly. Lungs:  Clear throughout to auscultation.    Heart:  Regular rate and rhythm. Abdomen:  Soft, nontender and nondistended. Normal bowel sounds, without guarding, and without rebound.   Neurologic:  Alert and  oriented x4;  grossly normal neurologically.  Impression/Plan: Ruth [name] is here for an colonoscopy to be performed for colon cancer screening  Risks, benefits, limitations, and alternatives regarding  colonoscopy have been reviewed with the patient.  Questions have been answered.  All parties agreeable.   Lannette Donath, MD  08/08/2022, 7:46 AM

## 2022-08-08 NOTE — Transfer of Care (Signed)
Immediate Anesthesia Transfer of Care Note  Patient: Ruth [name]  Procedure(s) Performed: COLONOSCOPY WITH PROPOFOL (Rectum)  Patient Location: PACU  Anesthesia Type: General  Level of Consciousness: awake, alert  and patient cooperative  Airway and Oxygen Therapy: Patient Spontanous Breathing and Patient connected to supplemental oxygen  Post-op Assessment: Post-op Vital signs reviewed, Patient's Cardiovascular Status Stable, Respiratory Function Stable, Patent Airway and No signs of Nausea or vomiting  Post-op Vital Signs: Reviewed and stable  Complications: No notable events documented.

## 2022-08-08 NOTE — Anesthesia Postprocedure Evaluation (Signed)
Anesthesia Post Note  Patient: Ruth [name]  Procedure(s) Performed: COLONOSCOPY WITH PROPOFOL (Rectum)  Patient location during evaluation: PACU Anesthesia Type: General Level of consciousness: awake and alert Pain management: pain level controlled Vital Signs Assessment: post-procedure vital signs reviewed and stable Respiratory status: spontaneous breathing, nonlabored ventilation and respiratory function stable Cardiovascular status: blood pressure returned to baseline and stable Postop Assessment: no apparent nausea or vomiting Anesthetic complications: no   No notable events documented.   Last Vitals:  Vitals:   08/08/22 0826 08/08/22 0836  BP: 110/70 125/68  Temp: 36.4 C (!) 36.4 C  SpO2: 97%     Last Pain:  Vitals:   08/08/22 0836  TempSrc:   PainSc: 0-No pain                 Foye Deer

## 2022-08-08 NOTE — Op Note (Signed)
[facility] Patient Name: Ruth [name] Procedure Date: 08/08/2022 7:14 AM MRN: [id number] Account #: [id number] Date of Birth: Sep 02, 1946 Admit Type: Outpatient Age: 76 Room: [facility] OR ROOM 01 Gender: Female Note Status: Finalized Instrument Name: [id number] Procedure:             Colonoscopy Indications:           Screening for colorectal malignant neoplasm Providers:             Toney Reil MD, MD Referring MD:          Onnie Boer. Sowles, MD (Referring MD) Medicines:             General Anesthesia Complications:         No immediate complications. Estimated blood loss: None. Procedure:             Pre-Anesthesia Assessment:                        - Prior to the procedure, a History and Physical was                         performed, and patient medications and allergies were                         reviewed. The patient is competent. The risks and                         benefits of the procedure and the sedation options and                         risks were discussed with the patient. All questions                         were answered and informed consent was obtained.                         Patient identification and proposed procedure were                         verified by the physician, the nurse, the                         anesthesiologist, the anesthetist and the technician                         in the pre-procedure area in the procedure room in the                         endoscopy suite. Mental Status Examination: alert and                         oriented. Airway Examination: normal oropharyngeal                         airway and neck mobility. Respiratory Examination:                         clear to auscultation. CV Examination: normal.  Prophylactic Antibiotics: The patient does not require                         prophylactic antibiotics. Prior Anticoagulants: The                         patient  has taken no anticoagulant or antiplatelet                         agents. ASA Grade Assessment: II - A patient with mild                         systemic disease. After reviewing the risks and                         benefits, the patient was deemed in satisfactory                         condition to undergo the procedure. The anesthesia                         plan was to use general anesthesia. Immediately prior                         to administration of medications, the patient was                         re-assessed for adequacy to receive sedatives. The                         heart rate, respiratory rate, oxygen saturations,                         blood pressure, adequacy of pulmonary ventilation, and                         response to care were monitored throughout the                         procedure. The physical status of the patient was                         re-assessed after the procedure.                        After obtaining informed consent, the colonoscope was                         passed under direct vision. Throughout the procedure,                         the patient's blood pressure, pulse, and oxygen                         saturations were monitored continuously. The                         Colonoscope was introduced through the anus and  advanced to the the cecum, identified by appendiceal                         orifice and ileocecal valve. The colonoscopy was                         performed without difficulty. The patient tolerated                         the procedure well. The quality of the bowel                         preparation was evaluated using the BBPS [facility] Bowel                         Preparation Scale) with scores of: Right Colon = 3,                         Transverse Colon = 3 and Left Colon = 3 (entire mucosa                         seen well with no residual staining, small fragments                          of stool or opaque liquid). The total BBPS score                         equals 9. The ileocecal valve, appendiceal orifice,                         and rectum were photographed. Findings:      The perianal and digital rectal examinations were normal. Pertinent       negatives include normal sphincter tone and no palpable rectal lesions.      The entire examined colon appeared normal.      The retroflexed view of the distal rectum and anal verge was normal and       showed no anal or rectal abnormalities. Impression:            - The entire examined colon is normal.                        - The distal rectum and anal verge are normal on                         retroflexion view.                        - No specimens collected. Recommendation:        - Discharge patient to home (with escort).                        - Resume previous diet today.                        - Continue present medications.                        - Repeat colonoscopy in  10 years for screening                         purposes. Procedure Code(s):     --- Professional ---                        B0488, Colorectal cancer screening; colonoscopy on                         individual not meeting criteria for high risk Diagnosis Code(s):     --- Professional ---                        Z12.11, Encounter for screening for malignant neoplasm                         of colon CPT copyright 2022 American Medical Association. All rights reserved. The codes documented in this report are preliminary and upon coder review may  be revised to meet current compliance requirements. Dr. Libby Maw Toney Reil MD, MD 08/08/2022 8:22:10 AM This report has been signed electronically. Number of Addenda: 0 Note Initiated On: 08/08/2022 7:14 AM Scope Withdrawal Time: 0 hours 10 minutes 35 seconds  Total Procedure Duration: 0 hours 14 minutes 47 seconds  Estimated Blood Loss:  Estimated blood loss: none.      [facility]

## 2022-08-09 ENCOUNTER — Encounter: Payer: Self-pay | Admitting: Gastroenterology

## 2022-08-21 ENCOUNTER — Other Ambulatory Visit: Payer: Self-pay | Admitting: Family Medicine

## 2022-08-21 DIAGNOSIS — I10 Essential (primary) hypertension: Secondary | ICD-10-CM

## 2022-08-23 ENCOUNTER — Other Ambulatory Visit: Payer: Self-pay | Admitting: Family Medicine

## 2022-08-23 DIAGNOSIS — I10 Essential (primary) hypertension: Secondary | ICD-10-CM

## 2022-08-27 ENCOUNTER — Other Ambulatory Visit: Payer: Self-pay | Admitting: Family Medicine

## 2022-08-27 DIAGNOSIS — I10 Essential (primary) hypertension: Secondary | ICD-10-CM

## 2022-08-27 NOTE — Telephone Encounter (Signed)
Pt called to report that she never received her refill for the most recent Rx of Olmesartan. She says the pharmacy just told her that she does not have a refill available. Even though her bottle says she has one refill available. She is asking for someone in the clinic to contact the pharmacy [facility] [address]) and resolve this matter.

## 2022-09-17 ENCOUNTER — Ambulatory Visit (INDEPENDENT_AMBULATORY_CARE_PROVIDER_SITE_OTHER): Payer: Medicare HMO | Admitting: Family Medicine

## 2022-09-17 ENCOUNTER — Encounter: Payer: Self-pay | Admitting: Family Medicine

## 2022-09-17 VITALS — BP 118/82 | HR 92 | Temp 98.1°F | Resp 16 | Ht 60.0 in | Wt 135.0 lb

## 2022-09-17 DIAGNOSIS — J4540 Moderate persistent asthma, uncomplicated: Secondary | ICD-10-CM

## 2022-09-17 DIAGNOSIS — Z20828 Contact with and (suspected) exposure to other viral communicable diseases: Secondary | ICD-10-CM | POA: Diagnosis not present

## 2022-09-17 DIAGNOSIS — J45998 Other asthma: Secondary | ICD-10-CM

## 2022-09-17 DIAGNOSIS — J069 Acute upper respiratory infection, unspecified: Secondary | ICD-10-CM | POA: Diagnosis not present

## 2022-09-17 MED ORDER — BENZONATATE 100 MG PO CAPS: 100.0000 mg | ORAL_CAPSULE | Freq: Three times a day (TID) | ORAL | 0 refills | Status: DC | PRN

## 2022-09-17 MED ORDER — MONTELUKAST SODIUM 10 MG PO TABS: 10.0000 mg | ORAL_TABLET | Freq: Every day | ORAL | 1 refills | Status: DC

## 2022-09-17 MED ORDER — LEVOCETIRIZINE DIHYDROCHLORIDE 5 MG PO TABS: 5.0000 mg | ORAL_TABLET | Freq: Every evening | ORAL | 1 refills | Status: DC

## 2022-09-17 NOTE — Progress Notes (Signed)
Patient ID: Ruth [name], female    DOB: 02-25-1946, 76 y.o.   MRN: [id number]  PCP: Alba Cory, MD  Chief Complaint  Patient presents with   Cough   Sore Throat   Generalized Body Aches    X1 week, exposed to RSV    Subjective:   Ruth [name] is a 76 y.o. female, presents to clinic with CC of the following:  URI sx, multiple sick contacts including grandson who went to hospital with RSV Hx of asthma, she is using her inhalers Concern with what the virus is, she has subjective fever body ache, generalized fatigue, nasal congestion/drainage, lost her voice, sore throat, coughing  URI  This is a new problem. The current episode started in the past 7 days. The problem has been unchanged. Maximum temperature: between 100-102. Associated symptoms include congestion, coughing, headaches, a plugged ear sensation, rhinorrhea, sinus pain, sneezing and a sore throat. Pertinent negatives include no chest pain, neck pain or vomiting. The treatment provided no relief.      Patient Active Problem List   Diagnosis Date Noted   Encounter for screening colonoscopy 08/08/2022   Senile purpura (HCC) 04/29/2022   Peripheral polyneuropathy 04/29/2022   Asthma, well controlled 04/29/2022   Moderate persistent asthma with acute exacerbation 12/26/2021   OSA on CPAP 04/15/2019   B12 deficiency 12/09/2018   Dyslipidemia 11/08/2017   Vitamin D deficiency 10/31/2016   Osteopenia of left hip 10/31/2016   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   BPV (benign positional vertigo) 04/17/2016   RLS (restless legs syndrome) 04/17/2016   GERD (gastroesophageal reflux disease) 03/11/2016   History of hyperparathyroidism 03/11/2016   History of gastric ulcer 03/11/2016   Essential hypertension 04/24/2015   Primary osteoarthritis involving multiple joints 04/24/2015   Lumbago 04/24/2015   Family history of colonic polyps       Current Outpatient Medications:    acetaminophen (TYLENOL) 500  MG tablet, Take 1-2 tablets (500-1,000 mg total) by mouth every 8 (eight) hours as needed., Disp: 100 tablet, Rfl: 2   benzonatate (TESSALON) 100 MG capsule, Take 1 capsule (100 mg total) by mouth 3 (three) times daily as needed for cough., Disp: 30 capsule, Rfl: 0   Calcium-Vitamin D 600-200 MG-UNIT tablet, Take 1 tablet by mouth 2 (two) times daily. , Disp: , Rfl:    Cholecalciferol (VITAMIN D3) 1000 units CAPS, Take by mouth., Disp: , Rfl:    Cod Liver Oil 1000 MG CAPS, Take by mouth., Disp: , Rfl:    Coenzyme Q10 (COQ10) 200 MG CAPS, Take 200 mg by mouth daily., Disp: , Rfl:    fluticasone furoate-vilanterol (BREO ELLIPTA) 200-25 MCG/ACT AEPB, Inhale 1 puff into the lungs daily., Disp: , Rfl:    fluticasone-salmeterol (ADVAIR HFA) 230-21 MCG/ACT inhaler, Inhale 2 puffs into the lungs 2 (two) times daily., Disp: 8 g, Rfl: 1   Garlic 1000 MG CAPS, Take 1,000 mg by mouth. , Disp: , Rfl:    L-Lysine 500 MG TABS, Take by mouth., Disp: , Rfl:    magnesium oxide (MAG-OX) 400 MG tablet, Take 500 mg by mouth daily. , Disp: , Rfl:    Nutritional Supplements (JOINT FORMULA PO), Take 2 tablets by mouth daily. glucosamine, Disp: , Rfl:    olmesartan-hydrochlorothiazide (BENICAR HCT) 40-25 MG tablet, Take 1 tablet by mouth daily., Disp: 90 tablet, Rfl: 1   omeprazole (PRILOSEC) 40 MG capsule, Take 1 capsule (40 mg total) by mouth daily., Disp: 90 capsule, Rfl: 1  Potassium Gluconate 2.5 MEQ TABS, Take 1 tablet by mouth. Pt taking 1 tab 99 mg daily, Disp: , Rfl:    pravastatin (PRAVACHOL) 20 MG tablet, Take 1 tablet (20 mg total) by mouth daily., Disp: 90 tablet, Rfl: 1   rOPINIRole (REQUIP) 1 MG tablet, Take 1 tablet (1 mg total) by mouth at bedtime., Disp: 90 tablet, Rfl: 1   levocetirizine (XYZAL) 5 MG tablet, Take 1 tablet (5 mg total) by mouth every evening., Disp: 90 tablet, Rfl: 1   montelukast (SINGULAIR) 10 MG tablet, Take 1 tablet (10 mg total) by mouth at bedtime., Disp: 90 tablet, Rfl:  1   Allergies  Allergen Reactions   Ace Inhibitors Cough   Aspirin Nausea Only    Abdominal pain   Crestor [Rosuvastatin]     Muscle pain      Social History   Tobacco Use   Smoking status: Never   Smokeless tobacco: Never  Vaping Use   Vaping Use: Never used  Substance Use Topics   Alcohol use: Yes    Alcohol/week: 3.0 standard drinks of alcohol    Types: 3 Glasses of wine per week    Comment: wine   Drug use: No      Chart Review Today: I personally reviewed active problem list, medication list, allergies, family history, social history, health maintenance, notes from last encounter, lab results, imaging with the patient/caregiver today.   Review of Systems  Constitutional: Negative.   HENT:  Positive for congestion, rhinorrhea, sinus pain, sneezing and sore throat.   Eyes: Negative.   Respiratory:  Positive for cough.   Cardiovascular: Negative.  Negative for chest pain.  Gastrointestinal: Negative.  Negative for vomiting.  Endocrine: Negative.   Genitourinary: Negative.   Musculoskeletal: Negative.  Negative for neck pain.  Skin: Negative.   Allergic/Immunologic: Negative.   Neurological:  Positive for headaches.  Hematological: Negative.   Psychiatric/Behavioral: Negative.    All other systems reviewed and are negative.      Objective:   Vitals:   09/17/22 1438  BP: 118/82  Pulse: 92  Resp: 16  Temp: 98.1 F (36.7 C)  TempSrc: Oral  SpO2: 98%  Weight: 135 lb (61.2 kg)  Height: 5' (1.524 m)    Body mass index is 26.37 kg/m.  Physical Exam Vitals and nursing note reviewed.  Constitutional:      General: She is not in acute distress.    Appearance: She is well-developed. She is not ill-appearing, toxic-appearing or diaphoretic.  HENT:     Head: Normocephalic and atraumatic.     Right Ear: Hearing, tympanic membrane, ear canal and external ear normal.     Left Ear: Hearing, tympanic membrane, ear canal and external ear normal.     Nose:  Mucosal edema, congestion and rhinorrhea present. Rhinorrhea is clear.     Right Sinus: No maxillary sinus tenderness or frontal sinus tenderness.     Left Sinus: No maxillary sinus tenderness or frontal sinus tenderness.     Mouth/Throat:     Mouth: Mucous membranes are moist. Mucous membranes are not pale.     Pharynx: Oropharynx is clear. Uvula midline. No pharyngeal swelling, oropharyngeal exudate, posterior oropharyngeal erythema or uvula swelling.     Tonsils: No tonsillar exudate or tonsillar abscesses.  Eyes:     General:        Right eye: No discharge.        Left eye: No discharge.     Conjunctiva/sclera: Conjunctivae normal.  Pupils: Pupils are equal, round, and reactive to light.  Neck:     Trachea: Trachea normal. No tracheal deviation.     Comments: Scratchy phonation Cardiovascular:     Rate and Rhythm: Normal rate and regular rhythm.     Heart sounds: Normal heart sounds.  Pulmonary:     Effort: Pulmonary effort is normal. No tachypnea, accessory muscle usage or respiratory distress.     Breath sounds: No stridor or transmitted upper airway sounds. No decreased breath sounds, wheezing, rhonchi or rales.  Abdominal:     General: Bowel sounds are normal. There is no distension.     Palpations: Abdomen is soft.  Musculoskeletal:        General: Normal range of motion.     Cervical back: Normal range of motion and neck supple.  Lymphadenopathy:     Head:     Right side of head: No submandibular adenopathy.     Left side of head: No submandibular adenopathy.     Cervical: No cervical adenopathy.  Skin:    General: Skin is warm and dry.     Coloration: Skin is not pale.     Findings: No rash.  Neurological:     Mental Status: She is alert.     Motor: No abnormal muscle tone.     Coordination: Coordination normal.  Psychiatric:        Behavior: Behavior normal.      Results for orders placed or performed during the hospital encounter of 08/08/22  Potassium   Result Value Ref Range   Potassium 3.4 (L) 3.5 - 5.1 mmol/L       Assessment & Plan:   Pt is 76 y/o female with hx of allergies and asthma, presents with over a week of URI sx body aches, subjective fever, cough, congestion She does not feel SOB or wheezy, just coughing, she is using her home maintenance and rescue inhalers Mostly concerned with what the infection is and how long she has been symptomatic for    ICD-10-CM   1. Contact with or exposure to viral disease  Z20.828 Respiratory virus panel    Novel Coronavirus, NAA ([facility])    2. Upper respiratory tract infection, unspecified type  J06.9 Respiratory virus panel    Novel Coronavirus, NAA ([facility])    levocetirizine (XYZAL) 5 MG tablet    benzonatate (TESSALON) 100 MG capsule   encouraged more supportive tx, tincture of time, sx may start to improve, lungs CTA, no wheeze/CP/SOB    3. Chronic allergic bronchitis  J45.998 levocetirizine (XYZAL) 5 MG tablet    montelukast (SINGULAIR) 10 MG tablet    benzonatate (TESSALON) 100 MG capsule   mucinex, allergy meds and pt can try tessalon perles or delsym    4. Asthma, moderate persistent, well-controlled  J45.40 levocetirizine (XYZAL) 5 MG tablet    montelukast (SINGULAIR) 10 MG tablet    benzonatate (TESSALON) 100 MG capsule   offered prednisone to hold in case of worsening, but she declined, encouraged her to let us know if she starts to have exacerbation sx      Home supportive and sx management encouraged - f/up if any worsening Likely to be self-limiting and gradually improve Pt well appearing today, VSS, no respiratory distress or increased work of breathing     Danelle Berry, PA-C 09/17/22 6:31 PM

## 2022-09-19 ENCOUNTER — Ambulatory Visit: Payer: Self-pay

## 2022-09-19 LAB — RESPIRATORY VIRUS PANEL

## 2022-09-19 LAB — NOVEL CORONAVIRUS, NAA: SARS-CoV-2, NAA: NOT DETECTED

## 2022-09-19 NOTE — Telephone Encounter (Signed)
  Chief Complaint: URI Symptoms: cough and congestion, sore throat, low grade fever, HA and ear pressure  Frequency: 1 week  Pertinent Negatives: Patient denies SOB Disposition: [] ED /[] Urgent Care (no appt availability in office) / [] Appointment(In office/virtual)/ []  [facility]/ [] Home Care/ [] Refused Recommended Disposition /[] [facility]/ [x]  Follow-up with PCP Additional Notes: pt states that she is no better and has been taking the benzonatate and not helping. Pt had OV on 09/17/22. Pt asking what else she can do to help get better. Was exposed to 14 mo grandson who tested positive for RSV but Resp Panel isn't resulted yet. Advised pt I would send message to provider for advice. Pt verbalized understanding.   Summary: Cough & Congestion Advice   Pt is calling to report that she was seen on 09/17/22 still has a lot of cough and congestion taking pearles but still not feeling better. Please advise         Reason for Disposition  [1] Sinus pain (not just congestion) AND [2] fever  Answer Assessment - Initial Assessment Questions 1. ONSET: "When did the nasal discharge start?"      1 week  3. COUGH: "Do you have a cough?" If Yes, ask: "Describe the color of your sputum" (clear, [place], yellow, green)     Yellowish  4. RESPIRATORY DISTRESS: "Describe your breathing."      No SOB  5. FEVER: "Do you have a fever?" If Yes, ask: "What is your temperature, how was it measured, and when did it start?"     Low grade  6. SEVERITY: "Overall, how bad are you feeling right now?" (e.g., doesn't interfere with normal activities, staying home from school/work, staying in bed)      Not getting any better  7. OTHER SYMPTOMS: "Do you have any other symptoms?" (e.g., sore throat, earache, wheezing, vomiting)     Cough and congestion and sore throat  Protocols used: Common Cold-A-AH

## 2022-09-27 ENCOUNTER — Ambulatory Visit: Payer: Self-pay

## 2022-09-27 ENCOUNTER — Telehealth (INDEPENDENT_AMBULATORY_CARE_PROVIDER_SITE_OTHER): Payer: Medicare HMO | Admitting: Family Medicine

## 2022-09-27 ENCOUNTER — Other Ambulatory Visit: Payer: Self-pay | Admitting: Family Medicine

## 2022-09-27 DIAGNOSIS — J069 Acute upper respiratory infection, unspecified: Secondary | ICD-10-CM | POA: Diagnosis not present

## 2022-09-27 DIAGNOSIS — J4540 Moderate persistent asthma, uncomplicated: Secondary | ICD-10-CM | POA: Diagnosis not present

## 2022-09-27 DIAGNOSIS — H9203 Otalgia, bilateral: Secondary | ICD-10-CM

## 2022-09-27 DIAGNOSIS — J45998 Other asthma: Secondary | ICD-10-CM

## 2022-09-27 DIAGNOSIS — J209 Acute bronchitis, unspecified: Secondary | ICD-10-CM | POA: Diagnosis not present

## 2022-09-27 MED ORDER — MOMETASONE FUROATE 50 MCG/ACT NA SUSP: 2.0000 | Freq: Every day | NASAL | 12 refills | Status: AC

## 2022-09-27 MED ORDER — PREDNISONE 20 MG PO TABS: ORAL_TABLET | ORAL | 0 refills | Status: DC

## 2022-09-27 MED ORDER — BENZONATATE 100 MG PO CAPS: 100.0000 mg | ORAL_CAPSULE | Freq: Three times a day (TID) | ORAL | 0 refills | Status: DC | PRN

## 2022-09-27 MED ORDER — GUAIFENESIN ER 600 MG PO TB12: 600.0000 mg | ORAL_TABLET | Freq: Two times a day (BID) | ORAL | 1 refills | Status: DC | PRN

## 2022-09-27 NOTE — Telephone Encounter (Signed)
  The patient called in stating she was seen on 12/26 and given medications which have helped some. She still has a sore throat, cough, congestion, headache and both ears are ringing and filled with fluid and they hurt. Please assist patient further as she uses  [facility], [place] Phone: [phone] Fax: [phone]     Left message to call back about symptoms.

## 2022-09-27 NOTE — Telephone Encounter (Signed)
  Chief Complaint: URI FU Symptoms: sore throat, cough with congestion, runny nose, ear pain, HA Frequency: ongoing for 2 weeks  Pertinent Negatives: Patient denies SOB or fever  Disposition: [] ED /[] Urgent Care (no appt availability in office) / [x] Appointment(In office/virtual)/ []  [facility]/ [] Home Care/ [] Refused Recommended Disposition /[] [facility]/ []  Follow-up with PCP Additional Notes: pt has about used all the rxs she was prescribed on 09/17/22 OV and states still doesn't feel a lot better but not having fever or SOB. Advised VV so scheduled for today at 1140 with Leisa, PA.   Reason for Disposition  Earache  Answer Assessment - Initial Assessment Questions 1. LOCATION: "Where does it hurt?"      headache 2. ONSET: "When did the sinus pain start?"  (e.g., hours, days)      Prior to 12/26 3. SEVERITY: "How bad is the pain?"   (Scale 1-10; mild, moderate or severe)   - MILD (1-3): doesn't interfere with normal activities    - MODERATE (4-7): interferes with normal activities (e.g., work or school) or awakens from sleep   - SEVERE (8-10): excruciating pain and patient unable to do any normal activities        Mild  5. NASAL CONGESTION: "Is the nose blocked?" If Yes, ask: "Can you open it or must you breathe through your mouth?"     yes 7. FEVER: "Do you have a fever?" If Yes, ask: "What is it, how was it measured, and when did it start?"      no 8. OTHER SYMPTOMS: "Do you have any other symptoms?" (e.g., sore throat, cough, earache, difficulty breathing)     Sore throat, cough congestion, runny nose, ears feel full, HA  Protocols used: Sinus Pain or Congestion-A-AH

## 2022-09-27 NOTE — Progress Notes (Signed)
Name: Ruth [name]   MRN: [id number]    DOB: August 26, 1946   Date:09/27/2022       Progress Note  Subjective:    Chief Complaint  Chief Complaint  Patient presents with   Follow-up    Pt had a visit the end of December   Cough    Running out of Tessalon pills requesting more   Nasal Congestion   Ear Pain    Right hurts more than left   Sore Throat    No fever    I connected with  Ruth [name]  on 09/27/22 at 11:40 AM EST by a video enabled telemedicine application and verified that I am speaking with the correct person using two identifiers.  I discussed the limitations of evaluation and management by telemedicine and the availability of in person appointments. The patient expressed understanding and agreed to proceed. Staff also discussed with the patient that there may be a patient responsible charge related to this service. Patient Location: Home Provider Location: [facility] Additional Individuals present:   Cough Associated symptoms include ear pain and a sore throat. Pertinent negatives include no chest pain, chills, fever, shortness of breath or wheezing.  Sore Throat  Associated symptoms include congestion, coughing and ear pain. Pertinent negatives include no ear discharge or shortness of breath.   F/up from 12/26 Still having sore throat, cough that is productive and she feels tight but not wheezy or SOB, new ear pain and popping Fever resolved    Patient Active Problem List   Diagnosis Date Noted   Encounter for screening colonoscopy 08/08/2022   Senile purpura ([facility]) 04/29/2022   Peripheral polyneuropathy 04/29/2022   Asthma, well controlled 04/29/2022   Moderate persistent asthma with acute exacerbation 12/26/2021   OSA on CPAP 04/15/2019   B12 deficiency 12/09/2018   Dyslipidemia 11/08/2017   Vitamin D deficiency 10/31/2016   Osteopenia of left hip 10/31/2016   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   BPV (benign positional vertigo) 04/17/2016   RLS  (restless legs syndrome) 04/17/2016   GERD (gastroesophageal reflux disease) 03/11/2016   History of hyperparathyroidism 03/11/2016   History of gastric ulcer 03/11/2016   Essential hypertension 04/24/2015   Primary osteoarthritis involving multiple joints 04/24/2015   Lumbago 04/24/2015   Family history of colonic polyps     Social History   Tobacco Use   Smoking status: Never   Smokeless tobacco: Never  Substance Use Topics   Alcohol use: Yes    Alcohol/week: 3.0 standard drinks of alcohol    Types: 3 Glasses of wine per week    Comment: wine     Current Outpatient Medications:    acetaminophen (TYLENOL) 500 MG tablet, Take 1-2 tablets (500-1,000 mg total) by mouth every 8 (eight) hours as needed., Disp: 100 tablet, Rfl: 2   benzonatate (TESSALON) 100 MG capsule, Take 1 capsule (100 mg total) by mouth 3 (three) times daily as needed for cough., Disp: 30 capsule, Rfl: 0   Calcium-Vitamin D 600-200 MG-UNIT tablet, Take 1 tablet by mouth 2 (two) times daily. , Disp: , Rfl:    Cholecalciferol (VITAMIN D3) 1000 units CAPS, Take by mouth., Disp: , Rfl:    Cod Liver Oil 1000 MG CAPS, Take by mouth., Disp: , Rfl:    Coenzyme Q10 (COQ10) 200 MG CAPS, Take 200 mg by mouth daily., Disp: , Rfl:    fluticasone furoate-vilanterol (BREO ELLIPTA) 200-25 MCG/ACT AEPB, Inhale 1 puff into the lungs daily., Disp: , Rfl:  fluticasone-salmeterol (ADVAIR HFA) 230-21 MCG/ACT inhaler, Inhale 2 puffs into the lungs 2 (two) times daily., Disp: 8 g, Rfl: 1   Garlic 7371 MG CAPS, Take 1,000 mg by mouth. , Disp: , Rfl:    L-Lysine 500 MG TABS, Take by mouth., Disp: , Rfl:    levocetirizine (XYZAL) 5 MG tablet, Take 1 tablet (5 mg total) by mouth every evening., Disp: 90 tablet, Rfl: 1   magnesium oxide (MAG-OX) 400 MG tablet, Take 500 mg by mouth daily. , Disp: , Rfl:    mometasone (NASONEX) 50 MCG/ACT nasal spray, Place 2 sprays into the nose daily., Disp: 1 each, Rfl: 12   montelukast (SINGULAIR) 10 MG  tablet, Take 1 tablet (10 mg total) by mouth at bedtime., Disp: 90 tablet, Rfl: 1   Nutritional Supplements (JOINT FORMULA PO), Take 2 tablets by mouth daily. glucosamine, Disp: , Rfl:    olmesartan-hydrochlorothiazide (BENICAR HCT) 40-25 MG tablet, Take 1 tablet by mouth daily., Disp: 90 tablet, Rfl: 1   omeprazole (PRILOSEC) 40 MG capsule, Take 1 capsule (40 mg total) by mouth daily., Disp: 90 capsule, Rfl: 1   Potassium Gluconate 2.5 MEQ TABS, Take 1 tablet by mouth. Pt taking 1 tab 99 mg daily, Disp: , Rfl:    pravastatin (PRAVACHOL) 20 MG tablet, Take 1 tablet (20 mg total) by mouth daily., Disp: 90 tablet, Rfl: 1   predniSONE (DELTASONE) 20 MG tablet, 2 tabs poqday 1-3, 1 tabs poqday 4-6, Disp: 9 tablet, Rfl: 0   rOPINIRole (REQUIP) 1 MG tablet, Take 1 tablet (1 mg total) by mouth at bedtime., Disp: 90 tablet, Rfl: 1  Allergies  Allergen Reactions   Ace Inhibitors Cough   Aspirin Nausea Only    Abdominal pain   Crestor [Rosuvastatin]     Muscle pain     I personally reviewed active problem list, medication list, allergies, family history, social history, health maintenance, notes from last encounter, lab results, imaging with the patient/caregiver today.  Review of Systems  Constitutional: Negative.  Negative for activity change, appetite change, chills, diaphoresis, fatigue, fever and unexpected weight change.  HENT:  Positive for congestion, ear pain and sore throat. Negative for ear discharge.   Eyes: Negative.   Respiratory:  Positive for cough and chest tightness. Negative for shortness of breath and wheezing.   Cardiovascular: Negative.  Negative for chest pain, palpitations and leg swelling.  Gastrointestinal: Negative.   Endocrine: Negative.   Genitourinary: Negative.   Musculoskeletal: Negative.   Skin: Negative.   Allergic/Immunologic: Negative.   Neurological: Negative.   Hematological: Negative.   Psychiatric/Behavioral: Negative.    All other systems reviewed and  are negative.     Objective:   Virtual encounter, vitals limited, only able to obtain the following There were no vitals filed for this visit. There is no height or weight on file to calculate BMI. Nursing Note and Vital Signs reviewed.  Physical Exam Vitals and nursing note reviewed.  Constitutional:      General: She is not in acute distress.    Appearance: Normal appearance. She is not ill-appearing, toxic-appearing or diaphoretic.  Pulmonary:     Effort: No respiratory distress.     Comments: Few times coughing, able to speak in full and complete sentences, no audible wheeze or stridor, no distress, retractions or accessory muscle use Neurological:     Mental Status: She is alert.  Psychiatric:        Mood and Affect: Mood normal.  Behavior: Behavior normal.     PE limited by virtual encounter  No results found for this or any previous visit (from the past 72 hour(s)).  Assessment and Plan:     ICD-10-CM   1. Upper respiratory tract infection, unspecified type  J06.9 mometasone (NASONEX) 50 MCG/ACT nasal spray    predniSONE (DELTASONE) 20 MG tablet    benzonatate (TESSALON) 100 MG capsule    guaiFENesin (MUCINEX) 600 MG 12 hr tablet   smoe continued sx, continue sx and supportive measures    2. Otalgia of both ears  H92.03 mometasone (NASONEX) 50 MCG/ACT nasal spray    predniSONE (DELTASONE) 20 MG tablet   right ear worse than left, suspect eustachian tube dysfunction, advised adding steroid nose spray and decongestant    3. Acute bronchitis, unspecified organism  J20.9 predniSONE (DELTASONE) 20 MG tablet    benzonatate (TESSALON) 100 MG capsule    guaiFENesin (MUCINEX) 600 MG 12 hr tablet   tightness, but denies wheeze or SOB, cough more productive, using maintenence inhalers, add mucinex and prednisone, continue other allergy/support sx meds    4. Asthma, moderate persistent, well-controlled  J45.40 predniSONE (DELTASONE) 20 MG tablet   some gradually  worsening respiratory sx since prior to christmas, add prednisone now and mucinex      Pt invited to come by the office for exam of ears if not improving (today or Monday)  -Red flags and when to present for emergency care or RTC including fever >101.43F, chest pain, shortness of breath, new/worsening/un-resolving symptoms, reviewed with patient at time of visit. Follow up and care instructions discussed and provided in AVS. - I discussed the assessment and treatment plan with the patient. The patient was provided an opportunity to ask questions and all were answered. The patient agreed with the plan and demonstrated an understanding of the instructions.  I provided 18+ minutes of non-face-to-face time during this encounter.  Delsa Grana, PA-C 09/27/22 12:24 PM

## 2022-10-29 NOTE — Progress Notes (Unsigned)
Name: Ruth [name]   MRN: [id number]    DOB: 07/13/1946   Date:10/30/2022       Progress Note  Subjective  Chief Complaint  Follow Up  HPI  History of OSA  she sees Dr. [place] Callas, she started CPAP machine in  May 2020, she stopped using it months ago because of RLS and neuropathy that kept her awake at night Repeat facility sleep study was negative and she has been doing well    GERD: tried to stop omeprazole but unable to tolerated symptoms without medication, she has been unable to take wean down to every other day. Unchanged   Polyneuropathy: seen by Dr. Sherryll Burger in the past , she is only taking Requip and staying hydrated and symptoms have improved    HTN: she is only on Benicar HCTZ 40/25 mg daily . Denies chest pain, palpitation or sob. Doing well on current regiment    Dyslipidemia:We started her on medications last year , last LDL was 52, continue medications. Recheck levels today   Vitamin D deficiency: taking otc supplements and last level was at goal , we will recheck labs     Asthma: under the care of Dr. [place] Callas, she states well controlled with medication, no wheezing or SOB but she has an occasional cough. Currently using Advair prn   B12 : last level was high and she stopped taking it completely, we will recheck level . She denies fatigue   Senile purpura: stable and reassurance given   Headache: she wakes up with a dull headache 4/10 , it improves as she moves around and drinks a cup of coffee. She drinks at most 1 -2 cups per day, no other caffeinate  drinks throughout the day    Osteopenia: reviewed results, improved on hip and spine  Positive impingement right shoulder: going on for years, decrease in abduction and internal rotation, offered PT but she wants to do it at home    Patient Active Problem List   Diagnosis Date Noted   Encounter for screening colonoscopy 08/08/2022   Senile purpura (HCC) 04/29/2022   Peripheral polyneuropathy 04/29/2022   Asthma, well  controlled 04/29/2022   Moderate persistent asthma with acute exacerbation 12/26/2021   OSA on CPAP 04/15/2019   B12 deficiency 12/09/2018   Dyslipidemia 11/08/2017   Vitamin D deficiency 10/31/2016   Osteopenia of left hip 10/31/2016   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   BPV (benign positional vertigo) 04/17/2016   RLS (restless legs syndrome) 04/17/2016   GERD (gastroesophageal reflux disease) 03/11/2016   History of hyperparathyroidism 03/11/2016   History of gastric ulcer 03/11/2016   Essential hypertension 04/24/2015   Primary osteoarthritis involving multiple joints 04/24/2015   Lumbago 04/24/2015   Family history of colonic polyps     Past Surgical History:  Procedure Laterality Date   CARPAL TUNNEL RELEASE Bilateral 2000   CATARACT EXTRACTION W/PHACO Left 04/18/2020   Procedure: CATARACT EXTRACTION PHACO AND INTRAOCULAR LENS PLACEMENT (IOC) LEFT 4.78  00:32.8;  Surgeon: Galen Manila, MD;  Location: [facility];  Service: Ophthalmology;  Laterality: Left;   CATARACT EXTRACTION W/PHACO Right 05/09/2020   Procedure: CATARACT EXTRACTION PHACO AND INTRAOCULAR LENS PLACEMENT (IOC) RIGHT 3.41 00:21.8;  Surgeon: Galen Manila, MD;  Location: [facility];  Service: Ophthalmology;  Laterality: Right;   COLONOSCOPY  06/17/2012   A few two mm ulcers were found in the sigmoid colon, no bleeding present,bx taken-path reporet on the few tiny ulcers ,non specific   COLONOSCOPY WITH PROPOFOL N/A  06/11/2017   Procedure: COLONOSCOPY WITH PROPOFOL;  Surgeon: Christene Lye, MD;  Location: [facility];  Service: Endoscopy;  Laterality: N/A;   COLONOSCOPY WITH PROPOFOL N/A 08/08/2022   Procedure: COLONOSCOPY WITH PROPOFOL;  Surgeon: Lin Landsman, MD;  Location: [facility];  Service: Endoscopy;  Laterality: N/A;   parathyroid surgery  2008   TEMPOROMANDIBULAR JOINT ARTHROPLASTY Left 1984   1989,1990-bilateral   TUBAL LIGATION      Family  History  Adopted: Yes  Problem Relation Age of Onset   Kidney disease Mother    Ovarian cancer Mother    Hypertension Father    Stroke Father    Lupus Sister    Breast cancer Maternal Grandmother    Breast cancer Cousin        pat cousins    Social History   Tobacco Use   Smoking status: Never   Smokeless tobacco: Never  Substance Use Topics   Alcohol use: Yes    Alcohol/week: 3.0 standard drinks of alcohol    Types: 3 Glasses of wine per week    Comment: wine     Current Outpatient Medications:    acetaminophen (TYLENOL) 500 MG tablet, Take 1-2 tablets (500-1,000 mg total) by mouth every 8 (eight) hours as needed., Disp: 100 tablet, Rfl: 2   Calcium-Vitamin D 600-200 MG-UNIT tablet, Take 1 tablet by mouth 2 (two) times daily. , Disp: , Rfl:    Cholecalciferol (VITAMIN D3) 1000 units CAPS, Take by mouth., Disp: , Rfl:    Cod Liver Oil 1000 MG CAPS, Take by mouth., Disp: , Rfl:    Coenzyme Q10 (COQ10) 200 MG CAPS, Take 200 mg by mouth daily., Disp: , Rfl:    fluticasone furoate-vilanterol (BREO ELLIPTA) 200-25 MCG/ACT AEPB, Inhale 1 puff into the lungs daily., Disp: , Rfl:    Garlic 1610 MG CAPS, Take 1,000 mg by mouth. , Disp: , Rfl:    L-Lysine 500 MG TABS, Take by mouth., Disp: , Rfl:    levocetirizine (XYZAL) 5 MG tablet, Take 1 tablet (5 mg total) by mouth every evening., Disp: 90 tablet, Rfl: 1   magnesium oxide (MAG-OX) 400 MG tablet, Take 500 mg by mouth daily. , Disp: , Rfl:    mometasone (NASONEX) 50 MCG/ACT nasal spray, Place 2 sprays into the nose daily., Disp: 1 each, Rfl: 12   montelukast (SINGULAIR) 10 MG tablet, Take 1 tablet (10 mg total) by mouth at bedtime., Disp: 90 tablet, Rfl: 1   Nutritional Supplements (JOINT FORMULA PO), Take 2 tablets by mouth daily. glucosamine, Disp: , Rfl:    Potassium Gluconate 2.5 MEQ TABS, Take 1 tablet by mouth. Pt taking 1 tab 99 mg daily, Disp: , Rfl:    olmesartan-hydrochlorothiazide (BENICAR HCT) 40-25 MG tablet, Take 1  tablet by mouth daily., Disp: 90 tablet, Rfl: 1   omeprazole (PRILOSEC) 40 MG capsule, Take 1 capsule (40 mg total) by mouth daily., Disp: 90 capsule, Rfl: 1   pravastatin (PRAVACHOL) 20 MG tablet, Take 1 tablet (20 mg total) by mouth daily., Disp: 90 tablet, Rfl: 1   rOPINIRole (REQUIP) 1 MG tablet, Take 1 tablet (1 mg total) by mouth at bedtime., Disp: 90 tablet, Rfl: 1  Allergies  Allergen Reactions   Ace Inhibitors Cough   Aspirin Nausea Only    Abdominal pain   Crestor [Rosuvastatin]     Muscle pain     I personally reviewed active problem list, medication list, allergies, family history, social history, health maintenance with  the patient/caregiver today.   ROS  Constitutional: Negative for fever or weight change.  Respiratory: Negative for cough and shortness of breath.   Cardiovascular: Negative for chest pain or palpitations.  Gastrointestinal: Negative for abdominal pain, no bowel changes.  Musculoskeletal: Negative for gait problem or joint swelling.  Skin: Negative for rash.  Neurological: Negative for dizziness or headache.  No other specific complaints in a complete review of systems (except as listed in HPI above).   Objective  Vitals:   10/30/22 1037  BP: 120/80  Pulse: 81  Resp: 16  Temp: 98.4 F (36.9 C)  TempSrc: Oral  SpO2: 98%  Weight: 143 lb 14.4 oz (65.3 kg)  Height: 5' (1.524 m)    Body mass index is 28.1 kg/m.  Physical Exam  Constitutional: Patient appears well-developed and well-nourished. No distress.  HEENT: head atraumatic, normocephalic, pupils equal and reactive to light, neck supple Cardiovascular: Normal rate, regular rhythm and normal heart sounds.  No murmur heard. No BLE edema. Pulmonary/Chest: Effort normal and breath sounds normal. No respiratory distress. Abdominal: Soft.  There is no tenderness. Psychiatric: Patient has a normal mood and affect. behavior is normal. Judgment and thought content normal.  Muscular Skeletal:  positive impingement sign    PHQ2/9:    10/30/2022   10:40 AM 09/27/2022   11:40 AM 09/17/2022    2:47 PM 07/08/2022   10:23 AM 04/29/2022   11:14 AM  Depression screen PHQ 2/9  Decreased Interest 0 0 0 0 0  Down, Depressed, Hopeless 0 0 0 0 0  PHQ - 2 Score 0 0 0 0 0  Altered sleeping 0 0 0 0 0  Tired, decreased energy 0 0 1 0 0  Change in appetite 0 0 1 0 0  Feeling bad or failure about yourself  0 0 0 0 0  Trouble concentrating 0 0 0 0 0  Moving slowly or fidgety/restless 0 0 0 0 0  Suicidal thoughts 0 0 0 0 0  PHQ-9 Score 0 0 2 0 0  Difficult doing work/chores  Not difficult at all   Not difficult at all    phq 9 is negative   Fall Risk:    10/30/2022   10:39 AM 09/27/2022   11:40 AM 09/17/2022    2:47 PM 07/08/2022   10:23 AM 04/29/2022   11:14 AM  Fall Risk   Falls in the past year? 1 1 1 1  0  Number falls in past yr: 1 1 1  0 0  Injury with Fall? 0 0 0 1 0  Risk for fall due to : History of fall(s) Impaired balance/gait No Fall Risks No Fall Risks No Fall Risks  Follow up Falls prevention discussed;Falls evaluation completed;Education provided Falls prevention discussed;Education provided;Falls evaluation completed Falls prevention discussed Falls prevention discussed Falls prevention discussed;Education provided      Functional Status Survey: Is the patient deaf or have difficulty hearing?: No Does the patient have difficulty seeing, even when wearing glasses/contacts?: No Does the patient have difficulty concentrating, remembering, or making decisions?: No Does the patient have difficulty walking or climbing stairs?: No Does the patient have difficulty dressing or bathing?: No Does the patient have difficulty doing errands alone such as visiting a doctor's office or shopping?: No    Assessment & Plan  1. Senile purpura ([place])  Reassurance given   2. RLS (restless legs syndrome)  - rOPINIRole (REQUIP) 1 MG tablet; Take 1 tablet (1 mg total) by mouth at  bedtime.  Dispense: 90 tablet; Refill: 1  3. Gastroesophageal reflux disease without esophagitis  - omeprazole (PRILOSEC) 40 MG capsule; Take 1 capsule (40 mg total) by mouth daily.  Dispense: 90 capsule; Refill: 1  4. Dyslipidemia  - pravastatin (PRAVACHOL) 20 MG tablet; Take 1 tablet (20 mg total) by mouth daily.  Dispense: 90 tablet; Refill: 1 - Lipid panel  5. Essential hypertension  - olmesartan-hydrochlorothiazide (BENICAR HCT) 40-25 MG tablet; Take 1 tablet by mouth daily.  Dispense: 90 tablet; Refill: 1 - CBC with Differential/Platelet - COMPLETE METABOLIC PANEL WITH GFR  6. Asthma, moderate persistent, well-controlled  Keep follow up with Dr. Donneta Romberg   7. Peripheral polyneuropathy   8. B12 deficiency  - B12 and Folate Panel  9. Vitamin D deficiency  - VITAMIN D 25 Hydroxy (Vit-D Deficiency, Fractures)  10. Breast cancer screening by mammogram  - MM 3D SCREEN BREAST BILATERAL; Future

## 2022-10-30 ENCOUNTER — Ambulatory Visit (INDEPENDENT_AMBULATORY_CARE_PROVIDER_SITE_OTHER): Payer: Medicare HMO | Admitting: Family Medicine

## 2022-10-30 ENCOUNTER — Encounter: Payer: Self-pay | Admitting: Family Medicine

## 2022-10-30 VITALS — BP 120/80 | HR 81 | Temp 98.4°F | Resp 16 | Ht 60.0 in | Wt 143.9 lb

## 2022-10-30 DIAGNOSIS — K219 Gastro-esophageal reflux disease without esophagitis: Secondary | ICD-10-CM | POA: Diagnosis not present

## 2022-10-30 DIAGNOSIS — D692 Other nonthrombocytopenic purpura: Secondary | ICD-10-CM

## 2022-10-30 DIAGNOSIS — I10 Essential (primary) hypertension: Secondary | ICD-10-CM | POA: Diagnosis not present

## 2022-10-30 DIAGNOSIS — E538 Deficiency of other specified B group vitamins: Secondary | ICD-10-CM | POA: Diagnosis not present

## 2022-10-30 DIAGNOSIS — Z1231 Encounter for screening mammogram for malignant neoplasm of breast: Secondary | ICD-10-CM | POA: Diagnosis not present

## 2022-10-30 DIAGNOSIS — E559 Vitamin D deficiency, unspecified: Secondary | ICD-10-CM

## 2022-10-30 DIAGNOSIS — E785 Hyperlipidemia, unspecified: Secondary | ICD-10-CM | POA: Diagnosis not present

## 2022-10-30 DIAGNOSIS — J4540 Moderate persistent asthma, uncomplicated: Secondary | ICD-10-CM | POA: Diagnosis not present

## 2022-10-30 DIAGNOSIS — G2581 Restless legs syndrome: Secondary | ICD-10-CM | POA: Diagnosis not present

## 2022-10-30 DIAGNOSIS — G629 Polyneuropathy, unspecified: Secondary | ICD-10-CM | POA: Diagnosis not present

## 2022-10-30 MED ORDER — ROPINIROLE HCL 1 MG PO TABS: 1.0000 mg | ORAL_TABLET | Freq: Every day | ORAL | 1 refills | Status: DC

## 2022-10-30 MED ORDER — PRAVASTATIN SODIUM 20 MG PO TABS: 20.0000 mg | ORAL_TABLET | Freq: Every day | ORAL | 1 refills | Status: DC

## 2022-10-30 MED ORDER — OLMESARTAN MEDOXOMIL-HCTZ 40-25 MG PO TABS: 1.0000 | ORAL_TABLET | Freq: Every day | ORAL | 1 refills | Status: DC

## 2022-10-30 MED ORDER — OMEPRAZOLE 40 MG PO CPDR: 40.0000 mg | DELAYED_RELEASE_CAPSULE | Freq: Every day | ORAL | 1 refills | Status: DC

## 2022-10-30 NOTE — Patient Instructions (Signed)
You need Tdap

## 2022-10-31 LAB — COMPLETE METABOLIC PANEL WITH GFR
AG Ratio: 1.7 (calc) (ref 1.0–2.5)
ALT: 19 U/L (ref 6–29)
AST: 24 U/L (ref 10–35)
Albumin: 4.3 g/dL (ref 3.6–5.1)
Alkaline phosphatase (APISO): 47 U/L (ref 37–153)
BUN: 10 mg/dL (ref 7–25)
CO2: 27 mmol/L (ref 20–32)
Calcium: 9.6 mg/dL (ref 8.6–10.4)
Chloride: 103 mmol/L (ref 98–110)
Creat: 0.81 mg/dL (ref 0.60–1.00)
Globulin: 2.5 g/dL (calc) (ref 1.9–3.7)
Glucose, Bld: 119 mg/dL — ABNORMAL HIGH (ref 65–99)
Potassium: 3.2 mmol/L — ABNORMAL LOW (ref 3.5–5.3)
Sodium: 142 mmol/L (ref 135–146)
Total Bilirubin: 0.4 mg/dL (ref 0.2–1.2)
Total Protein: 6.8 g/dL (ref 6.1–8.1)
eGFR: 76 mL/min/{1.73_m2} (ref >=60)

## 2022-10-31 LAB — CBC WITH DIFFERENTIAL/PLATELET
Absolute Monocytes: 423 cells/uL (ref 200–950)
Basophils Absolute: 33 cells/uL (ref 0–200)
Basophils Relative: 0.5 %
Eosinophils Absolute: 52 cells/uL (ref 15–500)
Eosinophils Relative: 0.8 %
HCT: 38.7 % (ref 35.0–45.0)
Hemoglobin: 13.3 g/dL (ref 11.7–15.5)
Lymphs Abs: 2171 cells/uL (ref 850–3900)
MCH: 31.9 pg (ref 27.0–33.0)
MCHC: 34.4 g/dL (ref 32.0–36.0)
MCV: 92.8 fL (ref 80.0–100.0)
MPV: 9.4 fL (ref 7.5–12.5)
Monocytes Relative: 6.5 %
Neutro Abs: 3822 cells/uL (ref 1500–7800)
Neutrophils Relative %: 58.8 %
Platelets: 301 10*3/uL (ref 140–400)
RBC: 4.17 10*6/uL (ref 3.80–5.10)
RDW: 12.3 % (ref 11.0–15.0)
Total Lymphocyte: 33.4 %
WBC: 6.5 10*3/uL (ref 3.8–10.8)

## 2022-10-31 LAB — LIPID PANEL
Cholesterol: 116 mg/dL (ref <200)
HDL: 48 mg/dL — ABNORMAL LOW (ref >=50)
LDL Cholesterol (Calc): 43 mg/dL (calc)
Non-HDL Cholesterol (Calc): 68 mg/dL (calc) (ref <130)
Total CHOL/HDL Ratio: 2.4 (calc) (ref <5.0)
Triglycerides: 173 mg/dL — ABNORMAL HIGH (ref <150)

## 2022-10-31 LAB — B12 AND FOLATE PANEL
Folate: 12.1 ng/mL
Vitamin B-12: 689 pg/mL (ref 200–1100)

## 2022-10-31 LAB — VITAMIN D 25 HYDROXY (VIT D DEFICIENCY, FRACTURES): Vit D, 25-Hydroxy: 40 ng/mL (ref 30–100)

## 2022-11-01 ENCOUNTER — Other Ambulatory Visit: Payer: Self-pay | Admitting: Family Medicine

## 2022-11-01 DIAGNOSIS — E876 Hypokalemia: Secondary | ICD-10-CM

## 2022-11-01 MED ORDER — POTASSIUM CHLORIDE CRYS ER 20 MEQ PO TBCR: 20.0000 meq | EXTENDED_RELEASE_TABLET | Freq: Every day | ORAL | 0 refills | Status: DC

## 2022-11-19 DIAGNOSIS — G2581 Restless legs syndrome: Secondary | ICD-10-CM | POA: Diagnosis not present

## 2022-11-19 DIAGNOSIS — I10 Essential (primary) hypertension: Secondary | ICD-10-CM | POA: Diagnosis not present

## 2022-11-19 DIAGNOSIS — E785 Hyperlipidemia, unspecified: Secondary | ICD-10-CM | POA: Diagnosis not present

## 2022-11-19 DIAGNOSIS — Z886 Allergy status to analgesic agent status: Secondary | ICD-10-CM | POA: Diagnosis not present

## 2022-11-19 DIAGNOSIS — J45909 Unspecified asthma, uncomplicated: Secondary | ICD-10-CM | POA: Diagnosis not present

## 2022-11-19 DIAGNOSIS — R32 Unspecified urinary incontinence: Secondary | ICD-10-CM | POA: Diagnosis not present

## 2022-11-19 DIAGNOSIS — K219 Gastro-esophageal reflux disease without esophagitis: Secondary | ICD-10-CM | POA: Diagnosis not present

## 2022-11-19 DIAGNOSIS — Z008 Encounter for other general examination: Secondary | ICD-10-CM | POA: Diagnosis not present

## 2022-11-19 DIAGNOSIS — Z7951 Long term (current) use of inhaled steroids: Secondary | ICD-10-CM | POA: Diagnosis not present

## 2022-11-19 DIAGNOSIS — M1990 Unspecified osteoarthritis, unspecified site: Secondary | ICD-10-CM | POA: Diagnosis not present

## 2022-11-26 DIAGNOSIS — R052 Subacute cough: Secondary | ICD-10-CM | POA: Diagnosis not present

## 2022-11-26 DIAGNOSIS — K219 Gastro-esophageal reflux disease without esophagitis: Secondary | ICD-10-CM | POA: Diagnosis not present

## 2022-11-26 DIAGNOSIS — J300 Vasomotor rhinitis: Secondary | ICD-10-CM | POA: Diagnosis not present

## 2022-12-06 ENCOUNTER — Ambulatory Visit: Payer: Medicare HMO

## 2022-12-10 ENCOUNTER — Ambulatory Visit: Payer: Medicare HMO

## 2022-12-11 ENCOUNTER — Encounter: Payer: Self-pay | Admitting: Family Medicine

## 2022-12-30 ENCOUNTER — Ambulatory Visit
Admission: RE | Admit: 2022-12-30 | Discharge: 2022-12-30 | Disposition: A | Discharge status: Home / Self Care | Payer: Medicare HMO | Source: Ambulatory Visit | Attending: Family Medicine | Admitting: Family Medicine

## 2022-12-30 DIAGNOSIS — Z1231 Encounter for screening mammogram for malignant neoplasm of breast: Secondary | ICD-10-CM | POA: Insufficient documentation

## 2023-01-01 ENCOUNTER — Other Ambulatory Visit: Payer: Self-pay | Admitting: Family Medicine

## 2023-01-01 DIAGNOSIS — N6489 Other specified disorders of breast: Secondary | ICD-10-CM

## 2023-01-01 DIAGNOSIS — R928 Other abnormal and inconclusive findings on diagnostic imaging of breast: Secondary | ICD-10-CM

## 2023-01-03 ENCOUNTER — Ambulatory Visit (INDEPENDENT_AMBULATORY_CARE_PROVIDER_SITE_OTHER): Payer: Medicare HMO

## 2023-01-03 VITALS — BP 118/64 | Ht 60.0 in | Wt 144.6 lb

## 2023-01-03 DIAGNOSIS — Z0000 Encounter for general adult medical examination without abnormal findings: Secondary | ICD-10-CM | POA: Diagnosis not present

## 2023-01-03 NOTE — Progress Notes (Signed)
Subjective:   Ruth [name] is a 77 y.o. female who presents for Medicare Annual (Subsequent) preventive examination.  Review of Systems    Cardiac Risk Factors include: advanced age (>38men, >47 women);hypertension;sedentary lifestyle    Objective:    Today's Vitals   01/03/23 1100 01/03/23 1104  Weight: 144 lb 9.6 oz (65.6 kg)   Height: 5' (1.524 m)   PainSc:  6    Body mass index is 28.24 kg/m.     01/03/2023   11:26 AM 08/08/2022    6:39 AM 12/04/2021    9:40 AM 11/30/2020    9:14 AM 05/09/2020    9:14 AM 04/18/2020    7:36 AM 11/25/2019   11:40 AM  Advanced Directives  Does Patient Have a Medical Advance Directive? No No No No No No No  Does patient want to make changes to medical advance directive?   No - Patient declined      Would patient like information on creating a medical advance directive?  No - Patient declined  Yes (MAU/Ambulatory/Procedural Areas - Information given) No - Patient declined Yes (MAU/Ambulatory/Procedural Areas - Information given) Yes (MAU/Ambulatory/Procedural Areas - Information given)    Current Medications (verified) Outpatient Encounter Medications as of 01/03/2023  Medication Sig   acetaminophen (TYLENOL) 500 MG tablet Take 1-2 tablets (500-1,000 mg total) by mouth every 8 (eight) hours as needed.   Calcium-Vitamin D 600-200 MG-UNIT tablet Take 1 tablet by mouth 2 (two) times daily.    Cholecalciferol (VITAMIN D3) 1000 units CAPS Take by mouth.   Cod Liver Oil 1000 MG CAPS Take by mouth.   Coenzyme Q10 (COQ10) 200 MG CAPS Take 200 mg by mouth daily.   fluticasone furoate-vilanterol (BREO ELLIPTA) 200-25 MCG/ACT AEPB Inhale 1 puff into the lungs daily.   Garlic 1000 MG CAPS Take 1,000 mg by mouth.    L-Lysine 500 MG TABS Take by mouth.   levocetirizine (XYZAL) 5 MG tablet Take 1 tablet (5 mg total) by mouth every evening.   magnesium oxide (MAG-OX) 400 MG tablet Take 500 mg by mouth daily.    mometasone (NASONEX) 50 MCG/ACT nasal spray  Place 2 sprays into the nose daily.   montelukast (SINGULAIR) 10 MG tablet Take 1 tablet (10 mg total) by mouth at bedtime.   Nutritional Supplements (JOINT FORMULA PO) Take 2 tablets by mouth daily. glucosamine   olmesartan-hydrochlorothiazide (BENICAR HCT) 40-25 MG tablet Take 1 tablet by mouth daily.   omeprazole (PRILOSEC) 40 MG capsule Take 1 capsule (40 mg total) by mouth daily.   potassium chloride SA (KLOR-CON M) 20 MEQ tablet Take 1 tablet (20 mEq total) by mouth daily.   pravastatin (PRAVACHOL) 20 MG tablet Take 1 tablet (20 mg total) by mouth daily.   rOPINIRole (REQUIP) 1 MG tablet Take 1 tablet (1 mg total) by mouth at bedtime.   No facility-administered encounter medications on file as of 01/03/2023.    Allergies (verified) Ace inhibitors, Aspirin, and Crestor [rosuvastatin]   History: Past Medical History:  Diagnosis Date   Allergy    mycins; cause diarrhea   Arthritis    since 2007/ back   Asthma    allergy related/ worse in spring   Breast screening, unspecified    Family history of colonic polyps    GERD (gastroesophageal reflux disease)    Headache    every morning   History of cystitis 1987   Hypertension    since 2005   Neuropathy    feet and  legs/  nerve damage from a fall   Osteoarthritis    Parathyroid disorder 2008   surgery   Restless leg syndrome    Scoliosis    Sleep apnea    CPAP used for a year and not now/ after official test told she didnt have it / test negative   Trigger finger of right hand    Ulcer 1966   Vertigo    hx of   Past Surgical History:  Procedure Laterality Date   CARPAL TUNNEL RELEASE Bilateral 2000   CATARACT EXTRACTION W/PHACO Left 04/18/2020   Procedure: CATARACT EXTRACTION PHACO AND INTRAOCULAR LENS PLACEMENT (IOC) LEFT 4.78  00:32.8;  Surgeon: Galen Manila, MD;  Location: [facility];  Service: Ophthalmology;  Laterality: Left;   CATARACT EXTRACTION W/PHACO Right 05/09/2020   Procedure: CATARACT  EXTRACTION PHACO AND INTRAOCULAR LENS PLACEMENT (IOC) RIGHT 3.41 00:21.8;  Surgeon: Galen Manila, MD;  Location: [facility];  Service: Ophthalmology;  Laterality: Right;   COLONOSCOPY  06/17/2012   A few two mm ulcers were found in the sigmoid colon, no bleeding present,bx taken-path reporet on the few tiny ulcers ,non specific   COLONOSCOPY WITH PROPOFOL N/A 06/11/2017   Procedure: COLONOSCOPY WITH PROPOFOL;  Surgeon: Kieth Brightly, MD;  Location: [facility];  Service: Endoscopy;  Laterality: N/A;   COLONOSCOPY WITH PROPOFOL N/A 08/08/2022   Procedure: COLONOSCOPY WITH PROPOFOL;  Surgeon: Toney Reil, MD;  Location: [facility];  Service: Endoscopy;  Laterality: N/A;   parathyroid surgery  2008   TEMPOROMANDIBULAR JOINT ARTHROPLASTY Left 1984   1989,1990-bilateral   TUBAL LIGATION     Family History  Adopted: Yes  Problem Relation Age of Onset   Kidney disease Mother    Ovarian cancer Mother    Hypertension Father    Stroke Father    Lupus Sister    Breast cancer Maternal Grandmother    Breast cancer Cousin        pat cousins   Social History   Socioeconomic History   Marital status: Married    Spouse name: Kaelei Wheeler    Number of children: 1   Years of education: Not on file   Highest education level: Some college, no degree  Occupational History   Not on file  Tobacco Use   Smoking status: Never   Smokeless tobacco: Never  Vaping Use   Vaping Use: Never used  Substance and Sexual Activity   Alcohol use: Yes    Alcohol/week: 3.0 standard drinks of alcohol    Types: 3 Glasses of wine per week    Comment: wine   Drug use: No   Sexual activity: Yes    Partners: Male    Birth control/protection: Post-menopausal  Other Topics Concern   Not on file  Social History Narrative   Married, retired, involved in church and has one grown son    Social Determinants of Health   Financial Resource Strain: Low Risk  (01/03/2023)    Overall Financial Resource Strain (CARDIA)    Difficulty of Paying Living Expenses: Not hard at all  Food Insecurity: No Food Insecurity (07/08/2022)   Hunger Vital Sign    Worried About Running Out of Food in the Last Year: Never true    Ran Out of Food in the Last Year: Never true  Transportation Needs: No Transportation Needs (01/03/2023)   PRAPARE - Administrator, Civil Service (Medical): No    Lack of Transportation (Non-Medical): No  Physical  Activity: Inactive (01/03/2023)   Exercise Vital Sign    Days of Exercise per Week: 0 days    Minutes of Exercise per Session: 0 min  Stress: No Stress Concern Present (01/03/2023)   [employer] of Occupational Health - Occupational Stress Questionnaire    Feeling of Stress : Not at all  Social Connections: Socially Integrated (01/03/2023)   Social Connection and Isolation Panel [NHANES]    Frequency of Communication with Friends and Family: More than three times a week    Frequency of Social Gatherings with Friends and Family: Three times a week    Attends Religious Services: More than 4 times per year    Active Member of Clubs or Organizations: Yes    Attends Engineer, structural: More than 4 times per year    Marital Status: Married    Tobacco Counseling Counseling given: Not Answered   Clinical Intake:  Pre-visit preparation completed: Yes  Pain : 0-10 Pain Score: 6  Pain Location: Shoulder (both shoulders and both arms) Pain Descriptors / Indicators: Aching Pain Onset: More than a month ago Pain Frequency: Intermittent Pain Relieving Factors: moving  Pain Relieving Factors: moving  BMI - recorded: 28.24 Nutritional Status: BMI 25 -29 Overweight Nutritional Risks: None Diabetes: No  How often do you need to have someone help you when you read instructions, pamphlets, or other written materials from your doctor or pharmacy?: 1 - Never  Diabetic?no  Interpreter Needed?: No  Comments: lives  with husband Information entered by :: B.Kashaun Bebo,LPN   Activities of Daily Living    01/03/2023   11:26 AM 10/30/2022   10:40 AM  In your present state of health, do you have any difficulty performing the following activities:  Hearing? 1 0  Vision? 0 0  Difficulty concentrating or making decisions? 0 0  Walking or climbing stairs? 0 0  Dressing or bathing? 0 0  Doing errands, shopping? 0 0  Preparing Food and eating ? N   Using the Toilet? N   In the past six months, have you accidently leaked urine? N   Do you have problems with loss of bowel control? N   Managing your Medications? N   Managing your Finances? N   Housekeeping or managing your Housekeeping? N     Patient Care Team: Alba Cory, MD as PCP - General (Family Medicine) Sidney Ace, MD as Referring Physician (Allergy) Lonell Face, MD as Consulting Physician (Neurology) Gaspar Cola, [facility] as Pharmacist (Pharmacist)  Indicate any recent Medical Services you may have received from other than [facility] providers in the past year (date may be approximate).     Assessment:   This is a routine wellness examination for Ruth [name].  Hearing/Vision screen Hearing Screening - Comments:: Adequate hearing Vision Screening - Comments:: Adequate vision after cataract surgery Dr  Azucena Cecil  Dietary issues and exercise activities discussed: Current Exercise Habits: The patient does not participate in regular exercise at present, Exercise limited by: neurologic condition(s);orthopedic condition(s)   Goals Addressed             This Visit's Progress    DIET - INCREASE WATER INTAKE   On track    Recommend drinking 6-8 glasses of water per day     Track and Manage My Blood Pressure-Hypertension   On track    Timeframe:  Long-Range Goal Priority:  High Start Date:  04/13/2021  Expected End Date:  04/13/2022                     Follow Up within 90 days    - check blood pressure weekly     Why is this important?   You won't feel high blood pressure, but it can still hurt your blood vessels.  High blood pressure can cause heart or kidney problems. It can also cause a stroke.  Making lifestyle changes like losing a little weight or eating less salt will help.  Checking your blood pressure at home and at different times of the day can help to control blood pressure.  If the doctor prescribes medicine remember to take it the way the doctor ordered.  Call the office if you cannot afford the medicine or if there are questions about it.     Notes:        Depression Screen    01/03/2023   11:21 AM 10/30/2022   10:40 AM 09/27/2022   11:40 AM 09/17/2022    2:47 PM 07/08/2022   10:23 AM 04/29/2022   11:14 AM 01/08/2022    3:07 PM  PHQ 2/9 Scores  PHQ - 2 Score 0 0 0 0 0 0 0  PHQ- 9 Score  0 0 2 0 0     Fall Risk    01/03/2023   11:13 AM 10/30/2022   10:39 AM 09/27/2022   11:40 AM 09/17/2022    2:47 PM 07/08/2022   10:23 AM  Fall Risk   Falls in the past year? 0 1 1 1 1   Number falls in past yr: 0 1 1 1  0  Injury with Fall? 0 0 0 0 1  Risk for fall due to : No Fall Risks History of fall(s) Impaired balance/gait No Fall Risks No Fall Risks  Follow up Education provided;Falls prevention discussed Falls prevention discussed;Falls evaluation completed;Education provided Falls prevention discussed;Education provided;Falls evaluation completed Falls prevention discussed Falls prevention discussed    FALL RISK PREVENTION PERTAINING TO THE HOME:  Any stairs in or around the home? No  If so, are there any without handrails? No  Home free of loose throw rugs in walkways, pet beds, electrical cords, etc? Yes  Adequate lighting in your home to reduce risk of falls? Yes   ASSISTIVE DEVICES UTILIZED TO PREVENT FALLS:  Life alert? No  Use of a cane, walker or w/c? No  Grab bars in the bathroom? Yes  Shower chair or bench in shower? No  Elevated toilet seat or a handicapped toilet?  Yes   TIMED UP AND GO:  Was the test performed? Yes .  Length of time to ambulate 10 feet: 8 sec.   Gait slow and steady without use of assistive device  Cognitive Function:        01/03/2023   11:27 AM 11/25/2019   11:46 AM 11/19/2018   12:42 PM  6CIT Screen  What Year? 0 points 0 points 0 points  What month? 0 points 0 points 0 points  What time? 0 points 0 points 0 points  Count back from 20 0 points 0 points 0 points  Months in reverse 0 points 2 points 0 points  Repeat phrase 0 points 2 points 0 points  Total Score 0 points 4 points 0 points    Immunizations Immunization History  Administered Date(s) Administered   Covid-19, Mrna,Vaccine(Spikevax)18yrs and older 07/13/2022   Fluad Quad(high Dose 65+) 05/25/2020, 06/04/2021   Influenza, High Dose  Seasonal PF 06/05/2015, 06/18/2016, 06/17/2017, 06/09/2018, 06/16/2019   Influenza-Unspecified 06/05/2015, 06/12/2022   Moderna Sars-Covid-2 Vaccination 10/19/2019, 11/16/2019, 07/26/2020, 01/06/2021   Pneumococcal Conjugate-13 12/09/2013   Pneumococcal Polysaccharide-23 04/13/2012   Rsv, Bivalent, Protein Subunit Rsvpref,pf Verdis Frederickson) 06/12/2022   Tdap 04/13/2012   Zoster Recombinat (Shingrix) 06/04/2021, 11/29/2021   Zoster, Live 07/24/2013    TDAP status: Up to date  Flu Vaccine status: Up to date  Pneumococcal vaccine status: Up to date  Covid-19 vaccine status: Completed vaccines  Qualifies for Shingles Vaccine? Yes   Zostavax completed Yes   Shingrix Completed?: Yes  Screening Tests Health Maintenance  Topic Date Due   COVID-19 Vaccine (6 - 2023-24 season) 09/07/2022   INFLUENZA VACCINE  04/24/2023   MAMMOGRAM  12/30/2023   Medicare Annual Wellness (AWV)  01/03/2024   Pneumonia Vaccine 35+ Years old  Completed   DEXA SCAN  Completed   Hepatitis C Screening  Completed   Zoster Vaccines- Shingrix  Completed   HPV VACCINES  Aged Out   DTaP/Tdap/Td  Discontinued   COLONOSCOPY (Pts 45-35yrs Insurance  coverage will need to be confirmed)  Discontinued    Health Maintenance  Health Maintenance Due  Topic Date Due   COVID-19 Vaccine (6 - 2023-24 season) 09/07/2022    Colorectal cancer screening: No longer required.   Mammogram status: No longer required due to age.  Bone Density status: Completed yes. Results reflect: Bone density results: OSTEOPENIA. Repeat every 5 years.  Lung Cancer Screening: (Low Dose CT Chest recommended if Age 68-80 years, 30 pack-year currently smoking OR have quit w/in 15years.) does not qualify.   Lung Cancer Screening Referral: no  Additional Screening:  Hepatitis C Screening: does not qualify; Completed yes  Vision Screening: Recommended annual ophthalmology exams for early detection of glaucoma and other disorders of the eye. Is the patient up to date with their annual eye exam?  Yes  Who is the provider or what is the name of the office in which the patient attends annual eye exams? Dr Azucena Cecil If pt is not established with a provider, would they like to be referred to a provider to establish care? No .   Dental Screening: Recommended annual dental exams for proper oral hygiene  Community Resource Referral / Chronic Care Management: CRR required this visit?  No   CCM required this visit?  No      Plan:     I have personally reviewed and noted the following in the patient's chart:   Medical and social history Use of alcohol, tobacco or illicit drugs  Current medications and supplements including opioid prescriptions. Patient is not currently taking opioid prescriptions. Functional ability and status Nutritional status Physical activity Advanced directives List of other physicians Hospitalizations, surgeries, and ER visits in previous 12 months Vitals Screenings to include cognitive, depression, and falls Referrals and appointments  In addition, I have reviewed and discussed with patient certain preventive protocols, quality  metrics, and best practice recommendations. A written personalized care plan for preventive services as well as general preventive health recommendations were provided to patient.     Sue Lush, LPN   [id number]/61/0960   Nurse Notes: The patient states they are doing well and has no concerns but want to know about her MMG results (what it means).

## 2023-01-03 NOTE — Patient Instructions (Signed)
Ruth [name] , Thank you for taking time to come for your Medicare Wellness Visit. I appreciate your ongoing commitment to your health goals. Please review the following plan we discussed and let me know if I can assist you in the future.   These are the goals we discussed:  Goals      DIET - INCREASE WATER INTAKE     Recommend drinking 6-8 glasses of water per day     Track and Manage My Blood Pressure-Hypertension     Timeframe:  Long-Range Goal Priority:  High Start Date:  04/13/2021                            Expected End Date:  04/13/2022                     Follow Up within 90 days    - check blood pressure weekly    Why is this important?   You won't feel high blood pressure, but it can still hurt your blood vessels.  High blood pressure can cause heart or kidney problems. It can also cause a stroke.  Making lifestyle changes like losing a little weight or eating less salt will help.  Checking your blood pressure at home and at different times of the day can help to control blood pressure.  If the doctor prescribes medicine remember to take it the way the doctor ordered.  Call the office if you cannot afford the medicine or if there are questions about it.     Notes:         This is a list of the screening recommended for you and due dates:  Health Maintenance  Topic Date Due   COVID-19 Vaccine (6 - 2023-24 season) 09/07/2022   Flu Shot  04/24/2023   Mammogram  12/30/2023   Medicare Annual Wellness Visit  01/03/2024   Pneumonia Vaccine  Completed   DEXA scan (bone density measurement)  Completed   Hepatitis C Screening: USPSTF Recommendation to screen - Ages 49-79 yo.  Completed   Zoster (Shingles) Vaccine  Completed   HPV Vaccine  Aged Out   DTaP/Tdap/Td vaccine  Discontinued   Colon Cancer Screening  Discontinued    Advanced directives: no  Conditions/risks identified: none  Next appointment: Follow up in one year for your annual wellness visit 01/08/2024  @10 :45am in person   Preventive Care 65 Years and Older, Female Preventive care refers to lifestyle choices and visits with your health care provider that can promote health and wellness. What does preventive care include? A yearly physical exam. This is also called an annual well check. Dental exams once or twice a year. Routine eye exams. Ask your health care provider how often you should have your eyes checked. Personal lifestyle choices, including: Daily care of your teeth and gums. Regular physical activity. Eating a healthy diet. Avoiding tobacco and drug use. Limiting alcohol use. Practicing safe sex. Taking low-dose aspirin every day. Taking vitamin and mineral supplements as recommended by your health care provider. What happens during an annual well check? The services and screenings done by your health care provider during your annual well check will depend on your age, overall health, lifestyle risk factors, and family history of disease. Counseling  Your health care provider may ask you questions about your: Alcohol use. Tobacco use. Drug use. Emotional well-being. Home and relationship well-being. Sexual activity. Eating habits. History of falls. Memory  and ability to understand (cognition). Work and work Astronomer. Reproductive health. Screening  You may have the following tests or measurements: Height, weight, and BMI. Blood pressure. Lipid and cholesterol levels. These may be checked every 5 years, or more frequently if you are over 44 years old. Skin check. Lung cancer screening. You may have this screening every year starting at age 69 if you have a 30-pack-year history of smoking and currently smoke or have quit within the past 15 years. Fecal occult blood test (FOBT) of the stool. You may have this test every year starting at age 30. Flexible sigmoidoscopy or colonoscopy. You may have a sigmoidoscopy every 5 years or a colonoscopy every 10 years  starting at age 63. Hepatitis C blood test. Hepatitis B blood test. Sexually transmitted disease (STD) testing. Diabetes screening. This is done by checking your blood sugar (glucose) after you have not eaten for a while (fasting). You may have this done every 1-3 years. Bone density scan. This is done to screen for osteoporosis. You may have this done starting at age 51. Mammogram. This may be done every 1-2 years. Talk to your health care provider about how often you should have regular mammograms. Talk with your health care provider about your test results, treatment options, and if necessary, the need for more tests. Vaccines  Your health care provider may recommend certain vaccines, such as: Influenza vaccine. This is recommended every year. Tetanus, diphtheria, and acellular pertussis (Tdap, Td) vaccine. You may need a Td booster every 10 years. Zoster vaccine. You may need this after age 86. Pneumococcal 13-valent conjugate (PCV13) vaccine. One dose is recommended after age 51. Pneumococcal polysaccharide (PPSV23) vaccine. One dose is recommended after age 39. Talk to your health care provider about which screenings and vaccines you need and how often you need them. This information is not intended to replace advice given to you by your health care provider. Make sure you discuss any questions you have with your health care provider. Document Released: 10/06/2015 Document Revised: 05/29/2016 Document Reviewed: 07/11/2015 Elsevier Interactive Patient Education  2017 [employer].  Fall Prevention in the Home Falls can cause injuries. They can happen to people of all ages. There are many things you can do to make your home safe and to help prevent falls. What can I do on the outside of my home? Regularly fix the edges of walkways and driveways and fix any cracks. Remove anything that might make you trip as you walk through a door, such as a raised step or threshold. Trim any bushes or  trees on the path to your home. Use bright outdoor lighting. Clear any walking paths of anything that might make someone trip, such as rocks or tools. Regularly check to see if handrails are loose or broken. Make sure that both sides of any steps have handrails. Any raised decks and porches should have guardrails on the edges. Have any leaves, snow, or ice cleared regularly. Use sand or salt on walking paths during winter. Clean up any spills in your garage right away. This includes oil or grease spills. What can I do in the bathroom? Use night lights. Install grab bars by the toilet and in the tub and shower. Do not use towel bars as grab bars. Use non-skid mats or decals in the tub or shower. If you need to sit down in the shower, use a plastic, non-slip stool. Keep the floor dry. Clean up any water that spills on the floor as  soon as it happens. Remove soap buildup in the tub or shower regularly. Attach bath mats securely with double-sided non-slip rug tape. Do not have throw rugs and other things on the floor that can make you trip. What can I do in the bedroom? Use night lights. Make sure that you have a light by your bed that is easy to reach. Do not use any sheets or blankets that are too big for your bed. They should not hang down onto the floor. Have a firm chair that has side arms. You can use this for support while you get dressed. Do not have throw rugs and other things on the floor that can make you trip. What can I do in the kitchen? Clean up any spills right away. Avoid walking on wet floors. Keep items that you use a lot in easy-to-reach places. If you need to reach something above you, use a strong step stool that has a grab bar. Keep electrical cords out of the way. Do not use floor polish or wax that makes floors slippery. If you must use wax, use non-skid floor wax. Do not have throw rugs and other things on the floor that can make you trip. What can I do with my  stairs? Do not leave any items on the stairs. Make sure that there are handrails on both sides of the stairs and use them. Fix handrails that are broken or loose. Make sure that handrails are as long as the stairways. Check any carpeting to make sure that it is firmly attached to the stairs. Fix any carpet that is loose or worn. Avoid having throw rugs at the top or bottom of the stairs. If you do have throw rugs, attach them to the floor with carpet tape. Make sure that you have a light switch at the top of the stairs and the bottom of the stairs. If you do not have them, ask someone to add them for you. What else can I do to help prevent falls? Wear shoes that: Do not have high heels. Have rubber bottoms. Are comfortable and fit you well. Are closed at the toe. Do not wear sandals. If you use a stepladder: Make sure that it is fully opened. Do not climb a closed stepladder. Make sure that both sides of the stepladder are locked into place. Ask someone to hold it for you, if possible. Clearly mark and make sure that you can see: Any grab bars or handrails. First and last steps. Where the edge of each step is. Use tools that help you move around (mobility aids) if they are needed. These include: Canes. Walkers. Scooters. Crutches. Turn on the lights when you go into a dark area. Replace any light bulbs as soon as they burn out. Set up your furniture so you have a clear path. Avoid moving your furniture around. If any of your floors are uneven, fix them. If there are any pets around you, be aware of where they are. Review your medicines with your doctor. Some medicines can make you feel dizzy. This can increase your chance of falling. Ask your doctor what other things that you can do to help prevent falls. This information is not intended to replace advice given to you by your health care provider. Make sure you discuss any questions you have with your health care provider. Document  Released: 07/06/2009 Document Revised: 02/15/2016 Document Reviewed: 10/14/2014 Elsevier Interactive Patient Education  2017 [employer].

## 2023-01-09 ENCOUNTER — Other Ambulatory Visit: Payer: Self-pay | Admitting: Family Medicine

## 2023-01-09 ENCOUNTER — Ambulatory Visit
Admission: RE | Admit: 2023-01-09 | Discharge: 2023-01-09 | Disposition: A | Discharge status: Home / Self Care | Payer: Medicare HMO | Source: Ambulatory Visit | Attending: Family Medicine | Admitting: Family Medicine

## 2023-01-09 DIAGNOSIS — R928 Other abnormal and inconclusive findings on diagnostic imaging of breast: Secondary | ICD-10-CM

## 2023-01-09 DIAGNOSIS — N6489 Other specified disorders of breast: Secondary | ICD-10-CM | POA: Insufficient documentation

## 2023-01-09 DIAGNOSIS — R92322 Mammographic fibroglandular density, left breast: Secondary | ICD-10-CM | POA: Diagnosis not present

## 2023-01-09 DIAGNOSIS — R921 Mammographic calcification found on diagnostic imaging of breast: Secondary | ICD-10-CM

## 2023-01-22 ENCOUNTER — Ambulatory Visit
Admission: RE | Admit: 2023-01-22 | Discharge: 2023-01-22 | Disposition: A | Discharge status: Home / Self Care | Payer: Medicare HMO | Source: Ambulatory Visit | Attending: Family Medicine | Admitting: Family Medicine

## 2023-01-22 DIAGNOSIS — N6082 Other benign mammary dysplasias of left breast: Secondary | ICD-10-CM | POA: Diagnosis not present

## 2023-01-22 DIAGNOSIS — R921 Mammographic calcification found on diagnostic imaging of breast: Secondary | ICD-10-CM | POA: Diagnosis not present

## 2023-01-22 DIAGNOSIS — R928 Other abnormal and inconclusive findings on diagnostic imaging of breast: Secondary | ICD-10-CM | POA: Insufficient documentation

## 2023-01-22 HISTORY — PX: BREAST BIOPSY: SHX20

## 2023-01-22 MED ORDER — LIDOCAINE-EPINEPHRINE 1 %-1:100000 IJ SOLN
10.0000 mL | Freq: Once | INTRAMUSCULAR | Status: AC
  Administered 2023-01-22: 10 mL

## 2023-01-22 MED ORDER — LIDOCAINE HCL (PF) 1 % IJ SOLN
10.0000 mL | Freq: Once | INTRAMUSCULAR | Status: AC
  Administered 2023-01-22: 10 mL

## 2023-01-23 ENCOUNTER — Ambulatory Visit: Payer: Self-pay

## 2023-01-23 LAB — SURGICAL PATHOLOGY

## 2023-01-23 NOTE — Telephone Encounter (Signed)
  Chief Complaint: fainting  Symptoms: fainting last night stomach pain prior felt like had diarrhea, went to bathroom felt nauseous and passed out, husband found her on floor had urinated and pass bowel, unsure how long unconscious Frequency: last night Pertinent Negatives: Patient denies any sx today Disposition: [] ED /[] Urgent Care (no appt availability in office) / [x] Appointment(In office/virtual)/ []  [facility]/ [] Home Care/ [] Refused Recommended Disposition /[] [facility]/ []  Follow-up with PCP Additional Notes: pt calling to report above sx, had busted lip where she fell but denies it needing emergent attention. Pt states this has been occurring for years just happening more frequent. Scheduled OV for tomorrow at 1120 with [place], [place].   Reason for Disposition  [1] All other patients AND [2] now alert and feels fine  (Exception: SIMPLE FAINT due to stress, pain, prolonged standing, or suddenly standing)  Answer Assessment - Initial Assessment Questions 1. ONSET: "How long were you unconscious?" (minutes) "When did it happen?"     Today unsure how long 2. CONTENT: "What happened during period of unconsciousness?" (e.g., seizure activity)      Eliminated urine and bowel  3. MENTAL STATUS: "Alert and oriented now?" (oriented x 3 = name, month, location)      yes 4. TRIGGER: "What do you think caused the fainting?" "What were you doing just before you fainted?"  (e.g., exercise, sudden standing up, prolonged standing)     Unsure got up to use bathroom and had to pee and felt nauseous so grabbed trash can and that is last she remembered  5. RECURRENT SYMPTOM: "Have you ever passed out before?" If Yes, ask: "When was the last time?" and "What happened that time?"      Previous time before  6. INJURY: "Did you sustain any injury during the fall?"      Busted lip 8. NEUROLOGIC SYMPTOMS: "Have you had any of the following symptoms: headache, numbness, vertigo,  weakness?"     no 10. OTHER SYMPTOMS: "Do you have any other symptoms?"       Stomach pain prior  Protocols used: Fainting-A-AH

## 2023-01-24 ENCOUNTER — Ambulatory Visit (INDEPENDENT_AMBULATORY_CARE_PROVIDER_SITE_OTHER): Payer: Medicare HMO | Admitting: Family Medicine

## 2023-01-24 VITALS — BP 130/76 | HR 84 | Temp 98.3°F | Resp 16 | Ht 60.0 in | Wt 142.5 lb

## 2023-01-24 DIAGNOSIS — R110 Nausea: Secondary | ICD-10-CM

## 2023-01-24 DIAGNOSIS — S0083XA Contusion of other part of head, initial encounter: Secondary | ICD-10-CM

## 2023-01-24 DIAGNOSIS — E876 Hypokalemia: Secondary | ICD-10-CM

## 2023-01-24 DIAGNOSIS — I4510 Unspecified right bundle-branch block: Secondary | ICD-10-CM

## 2023-01-24 DIAGNOSIS — R55 Syncope and collapse: Secondary | ICD-10-CM

## 2023-01-24 DIAGNOSIS — R4020 Unspecified coma: Secondary | ICD-10-CM

## 2023-01-24 NOTE — Patient Instructions (Signed)
Syncope, Adult  Syncope is when you pass out or faint for a short time. It is caused by a sudden decrease in blood flow to the brain. This can happen for many reasons. It can sometimes happen when seeing blood, getting a shot (injection), or having pain or strong emotions. Most causes of fainting are not dangerous, but in some cases it can be a sign of a serious medical problem. If you faint, get help right away. Call your local emergency services (911 in the [place] Follow these instructions at home: Watch for any changes in your symptoms. Take these actions to stay safe and help with your symptoms: Knowing when you may be about to faint Signs that you may be about to faint include: Feeling dizzy or light-headed. It may feel like the room is spinning. Feeling weak. Feeling like you may vomit (nauseous). Seeing spots or seeing all [place] or all black. Having cold, clammy skin. Feeling warm and sweaty. Hearing ringing in the ears. If you start to feel like you might faint, sit or lie down right away. If sitting, lower your head down between your legs. If lying down, raise (elevate) your feet above the level of your heart. Breathe deeply and steadily. Wait until all of the symptoms are gone. Have someone stay with you until you feel better. Medicines Take over-the-counter and prescription medicines only as told by your doctor. If you are taking blood pressure or heart medicine, sit up and stand up slowly. Spend a few minutes getting ready to sit and then stand. This can help you feel less dizzy. Lifestyle Do not drive, use machinery, or play sports until your doctor says it is okay. Do not drink alcohol. Do not smoke or use any products that contain nicotine or tobacco. If you need help quitting, ask your doctor. Avoid hot tubs and saunas. General instructions Talk with your doctor about your symptoms. You may need to have testing to help find the cause. Drink enough fluid to keep your pee  (urine) pale yellow. Avoid standing for a long time. If you must stand for a long time, do movements such as: Moving your legs. Crossing your legs. Flexing and stretching your leg muscles. Squatting. Keep all follow-up visits. Contact a doctor if: You have episodes of near fainting. Get help right away if: You pass out or faint. You hit your head or are injured after fainting. You have any of these symptoms: Fast or uneven heartbeats (palpitations). Pain in your chest, belly, or back. Shortness of breath. You have jerky movements that you cannot control (seizure). You have a very bad headache. You are confused. You have problems with how you see (vision). You are very weak. You have trouble walking. You are bleeding from your mouth or your butt (rectum). You have black or tarry poop (stool). These symptoms may be an emergency. Get help right away. Call your local emergency services (911 in the [place] Do not wait to see if the symptoms will go away. Do not drive yourself to the hospital. Summary Syncope is when you pass out or faint for a short time. It is caused by a sudden decrease in blood flow to the brain. Signs that you may be about to faint include feeling dizzy or light-headed, feeling like you may vomit, seeing all [place] or all black, or having cold, clammy skin. If you start to feel like you might faint, sit or lie down right away. Lower your head if sitting, or raise (elevate)   your feet if lying down. Breathe deeply and steadily. Wait until all of the symptoms are gone. This information is not intended to replace advice given to you by your health care provider. Make sure you discuss any questions you have with your health care provider. Document Revised: 01/18/2021 Document Reviewed: 01/18/2021 Elsevier Patient Education  2023 [employer]  

## 2023-01-24 NOTE — Progress Notes (Signed)
Patient ID: Ruth [name], female    DOB: Jan 05, 1946, 77 y.o.   MRN: [id number]  PCP: Alba Cory, MD  Chief Complaint  Patient presents with   Loss of Consciousness    Fainted yesterday, pt was feeling nauseous before she passed out. Pt state has hx of fainting. She mentioned she wakes up everyday with headaches   Headache    Subjective:   Ruth [name] is a 77 y.o. female, presents to clinic with CC of the following:  HPI  Had syncopal episode yesterday in the early morning when she got up to urinate, she reached the bathroom toilet, remembers reaching the toilet, putting the seat up, sitting down, she got nauseated on the commode, bend over more to try and reach the trash can and that was the alst thing she remembered and she lost consciousness.   pt notes she blacked out and next thing she remembers was a cold rag on her face the pts twin and husband were in the home and heard her and went to check on her.  Husband presant says she was uncounscious  between 1-5 min or maybe 5-10 min.  He states she was breathing. He went to the other end of the house to get her twin sister.  They did not call the ambulance.  She later regained consciousness. She had incontinence of stool and urine.  No vomiting, she had a little blood on lower lip and bump by her right eye.  They cleaned her up and she went to bed.  When she woke up later she was a little sore to bottom lip, notes Able to eat and drink, get up and down like normal yesterday  Hx of syncope with no prior apparent work up.  Husband states last time she passed out was about 6 months ago Patient reports she had multiple passing out episodes as a child Patient denies any current chest pain, abdominal pain, neck pain, headaches, palpitations, dizziness, neck pain, abdominal pain. She did have a breast biopsy on Wednesday in the afternoon prior to her syncopal episode early Thursday morning she states that she ate and drink like normal  Wednesday afternoon and evening.  She took a Tylenol when she went to bed but had not taken any pain medicine.  She says this syncopal episode was not like past syncopal episodes and she is not sure why she passed out, she is very bothered by the fact that she was incontinent of stool and urine. BP here today good BP Readings from Last 3 Encounters:  01/24/23 130/76  01/03/23 118/64  10/30/22 120/80   Orthostatics: done and neg  ECG - last in chart from a few years ago  Mabe, Currie Paris, RN Signed Ross Ludwig, RN 01/23/2023  5:19 PM   Telephone Encounter     Chief Complaint: fainting  Symptoms: fainting last night stomach pain prior felt like had diarrhea, went to bathroom felt nauseous and passed out, husband found her on floor had urinated and pass bowel, unsure how long unconscious Frequency: last night Pertinent Negatives: Patient denies any sx today Disposition: [] ED /[] Urgent Care (no appt availability in office) / [x] Appointment(In office/virtual)/ []  [facility]/ [] Home Care/ [] Refused Recommended Disposition /[] [facility]/ []  Follow-up with PCP Additional Notes: pt calling to report above sx, had busted lip where she fell but denies it needing emergent attention. Pt states this has been occurring for years just happening more frequent. Scheduled OV for  tomorrow at 1120 with [place], [place].    Reason for Disposition  [1] All other patients AND [2] now alert and feels fine  (Exception: SIMPLE FAINT due to stress, pain, prolonged standing, or suddenly standing)  Answer Assessment - Initial Assessment Questions 1. ONSET: "How long were you unconscious?" (minutes) "When did it happen?"     Today unsure how long 2. CONTENT: "What happened during period of unconsciousness?" (e.g., seizure activity)      Eliminated urine and bowel  3. MENTAL STATUS: "Alert and oriented now?" (oriented x 3 = name, month, location)      yes 4. TRIGGER: "What do you think caused the  fainting?" "What were you doing just before you fainted?"  (e.g., exercise, sudden standing up, prolonged standing)     Unsure got up to use bathroom and had to pee and felt nauseous so grabbed trash can and that is last she remembered  5. RECURRENT SYMPTOM: "Have you ever passed out before?" If Yes, ask: "When was the last time?" and "What happened that time?"      Previous time before  6. INJURY: "Did you sustain any injury during the fall?"      Busted lip 8. NEUROLOGIC SYMPTOMS: "Have you had any of the following symptoms: headache, numbness, vertigo, weakness?"     no 10. OTHER SYMPTOMS: "Do you have any other symptoms?"       Stomach pain prior       Chemistry      Component Value Date/Time   NA 142 10/30/2022 1126   NA 141 06/06/2015 0834   K 3.2 (L) 10/30/2022 1126   CL 103 10/30/2022 1126   CO2 27 10/30/2022 1126   BUN 10 10/30/2022 1126   BUN 13 06/06/2015 0834   CREATININE 0.81 10/30/2022 1126      Component Value Date/Time   CALCIUM 9.6 10/30/2022 1126   ALKPHOS 48 10/31/2016 0926   AST 24 10/30/2022 1126   ALT 19 10/30/2022 1126   BILITOT 0.4 10/30/2022 1126   BILITOT 0.5 06/06/2015 0834     Hx of low potassium not on rx - she take OTC      Patient Active Problem List   Diagnosis Date Noted   Encounter for screening colonoscopy 08/08/2022   Senile purpura (HCC) 04/29/2022   Peripheral polyneuropathy 04/29/2022   Asthma, well controlled 04/29/2022   Moderate persistent asthma with acute exacerbation 12/26/2021   OSA on CPAP 04/15/2019   B12 deficiency 12/09/2018   Dyslipidemia 11/08/2017   Vitamin D deficiency 10/31/2016   Osteopenia of left hip 10/31/2016   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   BPV (benign positional vertigo) 04/17/2016   RLS (restless legs syndrome) 04/17/2016   GERD (gastroesophageal reflux disease) 03/11/2016   History of hyperparathyroidism 03/11/2016   History of gastric ulcer 03/11/2016   Essential hypertension 04/24/2015    Primary osteoarthritis involving multiple joints 04/24/2015   Lumbago 04/24/2015   Family history of colonic polyps       Current Outpatient Medications:    acetaminophen (TYLENOL) 500 MG tablet, Take 1-2 tablets (500-1,000 mg total) by mouth every 8 (eight) hours as needed., Disp: 100 tablet, Rfl: 2   Calcium-Vitamin D 600-200 MG-UNIT tablet, Take 1 tablet by mouth 2 (two) times daily. , Disp: , Rfl:    Cholecalciferol (VITAMIN D3) 1000 units CAPS, Take by mouth., Disp: , Rfl:    Cod Liver Oil 1000 MG CAPS, Take by mouth., Disp: , Rfl:    Coenzyme Q10 (COQ10)  200 MG CAPS, Take 200 mg by mouth daily., Disp: , Rfl:    fluticasone furoate-vilanterol (BREO ELLIPTA) 200-25 MCG/ACT AEPB, Inhale 1 puff into the lungs daily., Disp: , Rfl:    Garlic 1000 MG CAPS, Take 1,000 mg by mouth. , Disp: , Rfl:    L-Lysine 500 MG TABS, Take by mouth., Disp: , Rfl:    levocetirizine (XYZAL) 5 MG tablet, Take 1 tablet (5 mg total) by mouth every evening., Disp: 90 tablet, Rfl: 1   magnesium oxide (MAG-OX) 400 MG tablet, Take 500 mg by mouth daily. , Disp: , Rfl:    mometasone (NASONEX) 50 MCG/ACT nasal spray, Place 2 sprays into the nose daily., Disp: 1 each, Rfl: 12   montelukast (SINGULAIR) 10 MG tablet, Take 1 tablet (10 mg total) by mouth at bedtime., Disp: 90 tablet, Rfl: 1   Nutritional Supplements (JOINT FORMULA PO), Take 2 tablets by mouth daily. glucosamine, Disp: , Rfl:    olmesartan-hydrochlorothiazide (BENICAR HCT) 40-25 MG tablet, Take 1 tablet by mouth daily., Disp: 90 tablet, Rfl: 1   omeprazole (PRILOSEC) 40 MG capsule, Take 1 capsule (40 mg total) by mouth daily., Disp: 90 capsule, Rfl: 1   potassium chloride SA (KLOR-CON M) 20 MEQ tablet, Take 1 tablet (20 mEq total) by mouth daily., Disp: 5 tablet, Rfl: 0   pravastatin (PRAVACHOL) 20 MG tablet, Take 1 tablet (20 mg total) by mouth daily., Disp: 90 tablet, Rfl: 1   rOPINIRole (REQUIP) 1 MG tablet, Take 1 tablet (1 mg total) by mouth at  bedtime., Disp: 90 tablet, Rfl: 1   Allergies  Allergen Reactions   Ace Inhibitors Cough   Aspirin Nausea Only    Abdominal pain   Crestor [Rosuvastatin]     Muscle pain      Social History   Tobacco Use   Smoking status: Never   Smokeless tobacco: Never  Vaping Use   Vaping Use: Never used  Substance Use Topics   Alcohol use: Yes    Alcohol/week: 3.0 standard drinks of alcohol    Types: 3 Glasses of wine per week    Comment: wine   Drug use: No      Chart Review Today: I personally reviewed active problem list, medication list, allergies, family history, social history, health maintenance, notes from last encounter, lab results, imaging with the patient/caregiver today.   Review of Systems  Constitutional: Negative.  Negative for activity change, appetite change, chills, diaphoresis, fatigue, fever and unexpected weight change.  HENT: Negative.    Eyes: Negative.   Respiratory: Negative.  Negative for cough, choking, chest tightness, shortness of breath and wheezing.   Cardiovascular: Negative.  Negative for chest pain, palpitations and leg swelling.  Gastrointestinal: Negative.  Negative for abdominal pain, constipation, diarrhea, nausea and vomiting.  Endocrine: Negative.   Genitourinary: Negative.   Musculoskeletal: Negative.  Negative for neck pain.  Skin: Negative.   Allergic/Immunologic: Negative.   Neurological:  Positive for syncope. Negative for dizziness, tremors, seizures, facial asymmetry, speech difficulty, weakness, light-headedness, numbness and headaches.  Hematological: Negative.   Psychiatric/Behavioral: Negative.    All other systems reviewed and are negative.      Objective:   Vitals:   01/24/23 1121  BP: 130/76  Pulse: 84  Resp: 16  Temp: 98.3 F (36.8 C)  TempSrc: Oral  SpO2: 96%  Weight: 142 lb 8 oz (64.6 kg)  Height: 5' (1.524 m)    Body mass index is 27.83 kg/m.  Physical Exam Vitals and nursing  note reviewed.   Constitutional:      General: She is not in acute distress.    Appearance: Normal appearance. She is well-developed. She is not ill-appearing, toxic-appearing or diaphoretic.  HENT:     Head: Normocephalic. Abrasion (lip) present. No raccoon eyes, Battle's sign or contusion.     Jaw: There is normal jaw occlusion.     Right Ear: Hearing, tympanic membrane, ear canal and external ear normal.     Left Ear: Hearing, tympanic membrane, ear canal and external ear normal.     Nose: Nose normal.     Mouth/Throat:     Lips: Pink.     Mouth: Mucous membranes are moist. Injury present. No oral lesions.     Dentition: Normal dentition.     Pharynx: Oropharynx is clear. Uvula midline. No pharyngeal swelling, oropharyngeal exudate, posterior oropharyngeal erythema or uvula swelling.  Eyes:     General: Lids are normal. No scleral icterus.       Right eye: No discharge.        Left eye: No discharge.     Extraocular Movements: Extraocular movements intact.     Conjunctiva/sclera: Conjunctivae normal.     Right eye: Right conjunctiva is not injected. No chemosis or exudate.    Left eye: Left conjunctiva is not injected. No chemosis or exudate.    Comments: Left lower orbital rim mildly ttp, no bruising or swelling noted  Neck:     Trachea: Trachea and phonation normal. No tracheal deviation.  Cardiovascular:     Rate and Rhythm: Normal rate and regular rhythm.     Pulses: Normal pulses.          Radial pulses are 2+ on the right side and 2+ on the left side.       Posterior tibial pulses are 2+ on the right side and 2+ on the left side.     Heart sounds: Normal heart sounds. No murmur heard.    No friction rub. No gallop.  Pulmonary:     Effort: Pulmonary effort is normal. No respiratory distress.     Breath sounds: Normal breath sounds. No stridor. No wheezing, rhonchi or rales.  Chest:     Chest wall: No tenderness.  Abdominal:     General: Bowel sounds are normal. There is no distension.      Palpations: Abdomen is soft.  Musculoskeletal:     Cervical back: Full passive range of motion without pain, normal range of motion and neck supple. No erythema. No spinous process tenderness or muscular tenderness. Normal range of motion.     Right lower leg: No edema.     Left lower leg: No edema.  Skin:    General: Skin is warm and dry.     Capillary Refill: Capillary refill takes less than 2 seconds.     Coloration: Skin is not jaundiced or pale.     Findings: No rash.  Neurological:     Mental Status: She is alert.     Motor: No abnormal muscle tone.     Gait: Gait normal.  Psychiatric:        Mood and Affect: Mood normal.        Speech: Speech normal.        Behavior: Behavior normal. Behavior is cooperative.      Orthostatic VS for the past 24 hrs:  BP- Lying Pulse- Lying BP- Sitting Pulse- Sitting BP- Standing at 0 minutes Pulse- Standing at 0 minutes  01/24/23  1200 108/64 71 110/64 68 112/66 72     Orthostatics reviewed and NEG    ECG interpretation   Date: 01/24/23  Rate: 70  Rhythm: normal sinus rhythm  QRS Axis: left axis  Conduction Disutrbances: RBBB new  Narrative Interpretation:  nsr, left axis deviation, RBBB abnormal ECG  Old EKG Reviewed: 10/05/2015 ECG was sinus rhythm left axis possible anterior infarct low QRS - no prior RBBB        Assessment & Plan:     ICD-10-CM   1. Syncope, unspecified syncope type  R55 CBC with Differential/Platelet    COMPLETE METABOLIC PANEL WITH GFR    TSH    Orthostatic vital signs    EKG 12-Lead    Ambulatory referral to Cardiology   brief sx prior, waking from sleep, on toilet, nauseaus with abd pain/sweat, then passed out with loss of bladder and bowels Pt reports hx of syncope many time, recently 6 months ago, no prior work up Unclear why family was not concerned or why there was no EMS called I have given them instructions and paperwork to explain that loss of consciousness especially if there is related  to arousal or incontinence -or for any concerning passing out episodes at this patient's age they should call EMS to come out and evaluate just to ensure that is not something serious and once the patient is assessed by emergency services that they can refuse to be transported to the ER if it is unnecessary I explained that this syncopal episode sounds very serious with loss of consciousness for 5 to 10 minutes and loss of bladder and bowel control Hopefully it was just a vasovagal or orthostatic hypotensive episode combined with waking up early in the morning the surgery before sitting on the toilet and bending over and then blacking out Patient had no other symptoms prior to this or 2 other reported syncopal episode she has not had any concerning cardiac symptoms since Orthostatics were reassuring EKG is abnormal with new right bundle branch block -urgent referral to reestablish with cardiology for reevaluation Labs will be checked The patient appears well-hydrated    2. Hypokalemia  E87.6    recheck labs    3. Nausea  R11.0    brief prior to syncope, normal appetite w/o nausea before and after           4. Loss of consciousness (HCC)  R40.20 Ambulatory referral to Cardiology   recurrent - multiple episodes, no past EMS called or work up with PCP or specialists    5 Facial contusion, initial encounter  S00.83XA    left lower eye tender w/o edema or ecchimosis, lower lip swollen with abrasion, no intraoral injury, normal neck ROM    6. 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

## 2023-01-25 LAB — COMPLETE METABOLIC PANEL WITH GFR
AG Ratio: 1.7 (calc) (ref 1.0–2.5)
ALT: 15 U/L (ref 6–29)
AST: 19 U/L (ref 10–35)
Albumin: 4.4 g/dL (ref 3.6–5.1)
Alkaline phosphatase (APISO): 54 U/L (ref 37–153)
BUN: 17 mg/dL (ref 7–25)
CO2: 28 mmol/L (ref 20–32)
Calcium: 10.2 mg/dL (ref 8.6–10.4)
Chloride: 103 mmol/L (ref 98–110)
Creat: 0.95 mg/dL (ref 0.60–1.00)
Globulin: 2.6 g/dL (calc) (ref 1.9–3.7)
Glucose, Bld: 79 mg/dL (ref 65–99)
Potassium: 3.7 mmol/L (ref 3.5–5.3)
Sodium: 141 mmol/L (ref 135–146)
Total Bilirubin: 0.4 mg/dL (ref 0.2–1.2)
Total Protein: 7 g/dL (ref 6.1–8.1)
eGFR: 62 mL/min/{1.73_m2} (ref >=60)

## 2023-01-25 LAB — TSH: TSH: 1.28 mIU/L (ref 0.40–4.50)

## 2023-01-25 LAB — CBC WITH DIFFERENTIAL/PLATELET
Absolute Monocytes: 782 cells/uL (ref 200–950)
Basophils Absolute: 34 cells/uL (ref 0–200)
Basophils Relative: 0.4 %
Eosinophils Absolute: 34 cells/uL (ref 15–500)
Eosinophils Relative: 0.4 %
HCT: 39.2 % (ref 35.0–45.0)
Hemoglobin: 13.2 g/dL (ref 11.7–15.5)
Lymphs Abs: 2584 cells/uL (ref 850–3900)
MCH: 31.2 pg (ref 27.0–33.0)
MCHC: 33.7 g/dL (ref 32.0–36.0)
MCV: 92.7 fL (ref 80.0–100.0)
MPV: 9.1 fL (ref 7.5–12.5)
Monocytes Relative: 9.2 %
Neutro Abs: 5066 cells/uL (ref 1500–7800)
Neutrophils Relative %: 59.6 %
Platelets: 287 10*3/uL (ref 140–400)
RBC: 4.23 10*6/uL (ref 3.80–5.10)
RDW: 11.6 % (ref 11.0–15.0)
Total Lymphocyte: 30.4 %
WBC: 8.5 10*3/uL (ref 3.8–10.8)

## 2023-01-30 DIAGNOSIS — I10 Essential (primary) hypertension: Secondary | ICD-10-CM | POA: Diagnosis not present

## 2023-01-30 DIAGNOSIS — R55 Syncope and collapse: Secondary | ICD-10-CM | POA: Diagnosis not present

## 2023-01-30 DIAGNOSIS — K219 Gastro-esophageal reflux disease without esophagitis: Secondary | ICD-10-CM | POA: Diagnosis not present

## 2023-01-30 DIAGNOSIS — I2089 Other forms of angina pectoris: Secondary | ICD-10-CM | POA: Diagnosis not present

## 2023-01-30 DIAGNOSIS — J45909 Unspecified asthma, uncomplicated: Secondary | ICD-10-CM | POA: Diagnosis not present

## 2023-01-31 ENCOUNTER — Other Ambulatory Visit: Payer: Self-pay | Admitting: Internal Medicine

## 2023-01-31 DIAGNOSIS — R55 Syncope and collapse: Secondary | ICD-10-CM

## 2023-01-31 NOTE — Progress Notes (Unsigned)
Name: Ruth [name]   MRN: [id number]    DOB: 1946-08-27   Date:02/03/2023       Progress Note  Subjective  Chief Complaint  Follow Up- Syncope  HPI  History of syncope: she states she had multiple episodes of syncope from teenage years until early 20's, she states at the time the diagnosis of anemia and once she took iron and anemia resolved symptoms resolved. She states about 6 months ago she had an episode and had a couple more since.  First episode happened after she developed pain / neuropathy, she felt nauseated , went to the kitchen and had some mustard and got up on the floor. She states no bowel or bladder incontinence   Second episode: sitting on the commode and woke up with head on the wall and voided all over the floor   Third episode: last episode was a couple of weeks ago. She woke up with stomach growling, set on the commode , she felt nauseated and woke up on the floor , she busted lower lip and had a bowel and bladder incontinence during episode  She was seen by Danelle Berry and sent to cardiologist , we will place referral to neurologist    Patient Active Problem List   Diagnosis Date Noted   Encounter for screening colonoscopy 08/08/2022   Senile purpura (HCC) 04/29/2022   Peripheral polyneuropathy 04/29/2022   Asthma, well controlled 04/29/2022   Moderate persistent asthma with acute exacerbation 12/26/2021   OSA on CPAP 04/15/2019   B12 deficiency 12/09/2018   Dyslipidemia 11/08/2017   Vitamin D deficiency 10/31/2016   Osteopenia of left hip 10/31/2016   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   BPV (benign positional vertigo) 04/17/2016   RLS (restless legs syndrome) 04/17/2016   GERD (gastroesophageal reflux disease) 03/11/2016   History of hyperparathyroidism 03/11/2016   History of gastric ulcer 03/11/2016   Essential hypertension 04/24/2015   Primary osteoarthritis involving multiple joints 04/24/2015   Lumbago 04/24/2015   Family history of colonic  polyps     Past Surgical History:  Procedure Laterality Date   BREAST BIOPSY Left 01/22/2023   stereo bx, distortion w/ calcs, RIBBON clip-path pending   BREAST BIOPSY Left 01/22/2023   MM LT BREAST BX W LOC DEV 1ST LESION IMAGE BX SPEC STEREO GUIDE 01/22/2023 [facility]   CARPAL TUNNEL RELEASE Bilateral 2000   CATARACT EXTRACTION W/PHACO Left 04/18/2020   Procedure: CATARACT EXTRACTION PHACO AND INTRAOCULAR LENS PLACEMENT (IOC) LEFT 4.78  00:32.8;  Surgeon: Galen Manila, MD;  Location: [facility];  Service: Ophthalmology;  Laterality: Left;   CATARACT EXTRACTION W/PHACO Right 05/09/2020   Procedure: CATARACT EXTRACTION PHACO AND INTRAOCULAR LENS PLACEMENT (IOC) RIGHT 3.41 00:21.8;  Surgeon: Galen Manila, MD;  Location: [facility];  Service: Ophthalmology;  Laterality: Right;   COLONOSCOPY  06/17/2012   A few two mm ulcers were found in the sigmoid colon, no bleeding present,bx taken-path reporet on the few tiny ulcers ,non specific   COLONOSCOPY WITH PROPOFOL N/A 06/11/2017   Procedure: COLONOSCOPY WITH PROPOFOL;  Surgeon: Kieth Brightly, MD;  Location: [facility];  Service: Endoscopy;  Laterality: N/A;   COLONOSCOPY WITH PROPOFOL N/A 08/08/2022   Procedure: COLONOSCOPY WITH PROPOFOL;  Surgeon: Toney Reil, MD;  Location: [facility];  Service: Endoscopy;  Laterality: N/A;   parathyroid surgery  2008   TEMPOROMANDIBULAR JOINT ARTHROPLASTY Left 1984   1989,1990-bilateral   TUBAL LIGATION      Family History  Adopted: Yes  Problem Relation Age of Onset   Kidney disease Mother    Ovarian cancer Mother    Hypertension Father    Stroke Father    Lupus Sister    Breast cancer Maternal Grandmother    Breast cancer Cousin        pat cousins    Social History   Tobacco Use   Smoking status: Never   Smokeless tobacco: Never  Substance Use Topics   Alcohol use: Yes    Alcohol/week: 3.0 standard drinks of alcohol    Types:  3 Glasses of wine per week    Comment: wine     Current Outpatient Medications:    acetaminophen (TYLENOL) 500 MG tablet, Take 1-2 tablets (500-1,000 mg total) by mouth every 8 (eight) hours as needed., Disp: 100 tablet, Rfl: 2   Calcium-Vitamin D 600-200 MG-UNIT tablet, Take 1 tablet by mouth 2 (two) times daily. , Disp: , Rfl:    Cholecalciferol (VITAMIN D3) 1000 units CAPS, Take by mouth., Disp: , Rfl:    Cod Liver Oil 1000 MG CAPS, Take by mouth., Disp: , Rfl:    Coenzyme Q10 (COQ10) 200 MG CAPS, Take 200 mg by mouth daily., Disp: , Rfl:    fluticasone furoate-vilanterol (BREO ELLIPTA) 200-25 MCG/ACT AEPB, Inhale 1 puff into the lungs daily., Disp: , Rfl:    Garlic 1000 MG CAPS, Take 1,000 mg by mouth. , Disp: , Rfl:    L-Lysine 500 MG TABS, Take by mouth., Disp: , Rfl:    levocetirizine (XYZAL) 5 MG tablet, Take 1 tablet (5 mg total) by mouth every evening., Disp: 90 tablet, Rfl: 1   magnesium oxide (MAG-OX) 400 MG tablet, Take 500 mg by mouth daily. , Disp: , Rfl:    mometasone (NASONEX) 50 MCG/ACT nasal spray, Place 2 sprays into the nose daily., Disp: 1 each, Rfl: 12   montelukast (SINGULAIR) 10 MG tablet, Take 1 tablet (10 mg total) by mouth at bedtime., Disp: 90 tablet, Rfl: 1   Nutritional Supplements (JOINT FORMULA PO), Take 2 tablets by mouth daily. glucosamine, Disp: , Rfl:    olmesartan-hydrochlorothiazide (BENICAR HCT) 40-25 MG tablet, Take 1 tablet by mouth daily., Disp: 90 tablet, Rfl: 1   omeprazole (PRILOSEC) 40 MG capsule, Take 1 capsule (40 mg total) by mouth daily., Disp: 90 capsule, Rfl: 1   potassium chloride SA (KLOR-CON M) 20 MEQ tablet, Take 1 tablet (20 mEq total) by mouth daily., Disp: 5 tablet, Rfl: 0   pravastatin (PRAVACHOL) 20 MG tablet, Take 1 tablet (20 mg total) by mouth daily., Disp: 90 tablet, Rfl: 1   rOPINIRole (REQUIP) 1 MG tablet, Take 1 tablet (1 mg total) by mouth at bedtime., Disp: 90 tablet, Rfl: 1  Allergies  Allergen Reactions   Ace  Inhibitors Cough   Aspirin Nausea Only    Abdominal pain   Crestor [Rosuvastatin]     Muscle pain     I personally reviewed active problem list, medication list, allergies, family history, social history, health maintenance with the patient/caregiver today.   ROS  Ten systems reviewed and is negative except as mentioned in HPI  Objective  Vitals:   02/03/23 0914  BP: 130/76  Pulse: 83  Resp: 16  SpO2: 97%  Weight: 143 lb (64.9 kg)  Height: 5' (1.524 m)    Body mass index is 27.93 kg/m.  Physical Exam  Constitutional: Patient appears well-developed and well-nourished.  No distress.  HEENT: head atraumatic, normocephalic, pupils equal and reactive to light, neck  supple Cardiovascular: Normal rate, regular rhythm and normal heart sounds.  No murmur heard. No BLE edema. Pulmonary/Chest: Effort normal and breath sounds normal. No respiratory distress. Abdominal: Soft.  There is no tenderness. Psychiatric: Patient has a normal mood and affect. behavior is normal. Judgment and thought content normal.    PHQ2/9:    02/03/2023    9:13 AM 01/24/2023   11:21 AM 01/03/2023   11:21 AM 10/30/2022   10:40 AM 09/27/2022   11:40 AM  Depression screen PHQ 2/9  Decreased Interest 0 0 0 0 0  Down, Depressed, Hopeless 0 0 0 0 0  PHQ - 2 Score 0 0 0 0 0  Altered sleeping 0 0  0 0  Tired, decreased energy 0 0  0 0  Change in appetite 0 0  0 0  Feeling bad or failure about yourself  0 0  0 0  Trouble concentrating 0 0  0 0  Moving slowly or fidgety/restless 0 0  0 0  Suicidal thoughts 0 0  0 0  PHQ-9 Score 0 0  0 0  Difficult doing work/chores  Not difficult at all   Not difficult at all    phq 9 is negative   Fall Risk:    02/03/2023    9:13 AM 01/24/2023   11:21 AM 01/03/2023   11:13 AM 10/30/2022   10:39 AM 09/27/2022   11:40 AM  Fall Risk   Falls in the past year? 0 0 0 1 1  Number falls in past yr: 0 0 0 1 1  Injury with Fall? 0 0 0 0 0  Risk for fall due to : No Fall Risks  No Fall Risks No Fall Risks History of fall(s) Impaired balance/gait  Follow up Falls prevention discussed Falls prevention discussed;Education provided;Falls evaluation completed Education provided;Falls prevention discussed Falls prevention discussed;Falls evaluation completed;Education provided Falls prevention discussed;Education provided;Falls evaluation completed      Functional Status Survey: Is the patient deaf or have difficulty hearing?: Yes Does the patient have difficulty seeing, even when wearing glasses/contacts?: No Does the patient have difficulty concentrating, remembering, or making decisions?: No Does the patient have difficulty walking or climbing stairs?: No Does the patient have difficulty dressing or bathing?: No Does the patient have difficulty doing errands alone such as visiting a doctor's office or shopping?: No    Assessment & Plan  1. Syncope, unspecified syncope type  - Ambulatory referral to Neurology   Advised to keep follow up and scheduled appointments made by cardiologist   Advised her not to drive until she gets evaluated by neurologist

## 2023-02-03 ENCOUNTER — Ambulatory Visit (INDEPENDENT_AMBULATORY_CARE_PROVIDER_SITE_OTHER): Payer: Medicare HMO | Admitting: Family Medicine

## 2023-02-03 ENCOUNTER — Encounter: Payer: Self-pay | Admitting: Family Medicine

## 2023-02-03 VITALS — BP 130/76 | HR 83 | Resp 16 | Ht 60.0 in | Wt 143.0 lb

## 2023-02-03 DIAGNOSIS — R55 Syncope and collapse: Secondary | ICD-10-CM | POA: Diagnosis not present

## 2023-02-05 DIAGNOSIS — R55 Syncope and collapse: Secondary | ICD-10-CM | POA: Diagnosis not present

## 2023-02-05 DIAGNOSIS — H43813 Vitreous degeneration, bilateral: Secondary | ICD-10-CM | POA: Diagnosis not present

## 2023-02-05 DIAGNOSIS — H35372 Puckering of macula, left eye: Secondary | ICD-10-CM | POA: Diagnosis not present

## 2023-02-05 DIAGNOSIS — Z961 Presence of intraocular lens: Secondary | ICD-10-CM | POA: Diagnosis not present

## 2023-02-10 DIAGNOSIS — R55 Syncope and collapse: Secondary | ICD-10-CM | POA: Diagnosis not present

## 2023-02-11 DIAGNOSIS — R55 Syncope and collapse: Secondary | ICD-10-CM | POA: Diagnosis not present

## 2023-02-11 DIAGNOSIS — I6523 Occlusion and stenosis of bilateral carotid arteries: Secondary | ICD-10-CM | POA: Diagnosis not present

## 2023-02-24 ENCOUNTER — Telehealth (HOSPITAL_COMMUNITY): Payer: Self-pay | Admitting: Emergency Medicine

## 2023-02-24 DIAGNOSIS — R079 Chest pain, unspecified: Secondary | ICD-10-CM

## 2023-02-24 MED ORDER — METOPROLOL TARTRATE 100 MG PO TABS
100.0000 mg | ORAL_TABLET | Freq: Once | ORAL | 0 refills | Status: DC
Start: 2023-02-24 — End: 2023-05-12

## 2023-02-24 MED ORDER — IVABRADINE HCL 5 MG PO TABS
15.0000 mg | ORAL_TABLET | Freq: Once | ORAL | 0 refills | Status: AC
Start: 2023-02-24 — End: 2023-02-24

## 2023-02-24 NOTE — Telephone Encounter (Signed)
Reaching out to patient to offer assistance regarding upcoming cardiac imaging study; pt verbalizes understanding of appt date/time, parking situation and where to check in, pre-test NPO status and medications ordered, and verified current allergies; name and call back number provided for further questions should they arise Rockwell Alexandria RN Navigator Cardiac Imaging [facility] [phone] office [phone] cell  100mg  metoprolol tart + 15mg  ivabradine

## 2023-02-26 ENCOUNTER — Ambulatory Visit
Admission: RE | Admit: 2023-02-26 | Discharge: 2023-02-26 | Disposition: A | Discharge status: Home / Self Care | Payer: Medicare HMO | Source: Ambulatory Visit | Attending: Internal Medicine | Admitting: Internal Medicine

## 2023-02-26 DIAGNOSIS — I2510 Atherosclerotic heart disease of native coronary artery without angina pectoris: Secondary | ICD-10-CM | POA: Insufficient documentation

## 2023-02-26 DIAGNOSIS — R55 Syncope and collapse: Secondary | ICD-10-CM | POA: Diagnosis not present

## 2023-02-26 DIAGNOSIS — M5033 Other cervical disc degeneration, cervicothoracic region: Secondary | ICD-10-CM | POA: Diagnosis not present

## 2023-02-26 DIAGNOSIS — M9901 Segmental and somatic dysfunction of cervical region: Secondary | ICD-10-CM | POA: Diagnosis not present

## 2023-02-26 DIAGNOSIS — R519 Headache, unspecified: Secondary | ICD-10-CM | POA: Diagnosis not present

## 2023-02-26 DIAGNOSIS — M9903 Segmental and somatic dysfunction of lumbar region: Secondary | ICD-10-CM | POA: Diagnosis not present

## 2023-02-26 MED ORDER — NITROGLYCERIN 0.4 MG SL SUBL
0.8000 mg | SUBLINGUAL_TABLET | Freq: Once | SUBLINGUAL | Status: AC
  Administered 2023-02-26: 0.8 mg via SUBLINGUAL
  Filled 2023-02-26: qty 25

## 2023-02-26 MED ORDER — IOHEXOL 350 MG/ML SOLN
100.0000 mL | Freq: Once | INTRAVENOUS | Status: AC | PRN
  Administered 2023-02-26: 100 mL via INTRAVENOUS

## 2023-02-26 NOTE — Progress Notes (Signed)
Pt completed scan with no issues. Pt ABCs intact. Pt encouraged to drink plenty of water. Pt ambulatory with steady gait. Pt denies any complaints.

## 2023-03-03 DIAGNOSIS — M9901 Segmental and somatic dysfunction of cervical region: Secondary | ICD-10-CM | POA: Diagnosis not present

## 2023-03-03 DIAGNOSIS — R519 Headache, unspecified: Secondary | ICD-10-CM | POA: Diagnosis not present

## 2023-03-03 DIAGNOSIS — M9903 Segmental and somatic dysfunction of lumbar region: Secondary | ICD-10-CM | POA: Diagnosis not present

## 2023-03-03 DIAGNOSIS — M5033 Other cervical disc degeneration, cervicothoracic region: Secondary | ICD-10-CM | POA: Diagnosis not present

## 2023-03-05 DIAGNOSIS — M9903 Segmental and somatic dysfunction of lumbar region: Secondary | ICD-10-CM | POA: Diagnosis not present

## 2023-03-05 DIAGNOSIS — M9901 Segmental and somatic dysfunction of cervical region: Secondary | ICD-10-CM | POA: Diagnosis not present

## 2023-03-05 DIAGNOSIS — M5033 Other cervical disc degeneration, cervicothoracic region: Secondary | ICD-10-CM | POA: Diagnosis not present

## 2023-03-05 DIAGNOSIS — R519 Headache, unspecified: Secondary | ICD-10-CM | POA: Diagnosis not present

## 2023-03-06 DIAGNOSIS — R519 Headache, unspecified: Secondary | ICD-10-CM | POA: Diagnosis not present

## 2023-03-06 DIAGNOSIS — M9901 Segmental and somatic dysfunction of cervical region: Secondary | ICD-10-CM | POA: Diagnosis not present

## 2023-03-06 DIAGNOSIS — M5033 Other cervical disc degeneration, cervicothoracic region: Secondary | ICD-10-CM | POA: Diagnosis not present

## 2023-03-06 DIAGNOSIS — M9903 Segmental and somatic dysfunction of lumbar region: Secondary | ICD-10-CM | POA: Diagnosis not present

## 2023-03-07 ENCOUNTER — Emergency Department
Admission: EM | Admit: 2023-03-07 | Discharge: 2023-03-07 | Disposition: A | Discharge status: Home / Self Care | Payer: Medicare HMO | Attending: Emergency Medicine | Admitting: Emergency Medicine

## 2023-03-07 ENCOUNTER — Other Ambulatory Visit: Payer: Self-pay

## 2023-03-07 ENCOUNTER — Emergency Department: Payer: Medicare HMO

## 2023-03-07 ENCOUNTER — Encounter: Payer: Self-pay | Admitting: Emergency Medicine

## 2023-03-07 DIAGNOSIS — Z79899 Other long term (current) drug therapy: Secondary | ICD-10-CM | POA: Insufficient documentation

## 2023-03-07 DIAGNOSIS — M25512 Pain in left shoulder: Secondary | ICD-10-CM | POA: Insufficient documentation

## 2023-03-07 DIAGNOSIS — J45909 Unspecified asthma, uncomplicated: Secondary | ICD-10-CM | POA: Insufficient documentation

## 2023-03-07 DIAGNOSIS — I10 Essential (primary) hypertension: Secondary | ICD-10-CM | POA: Insufficient documentation

## 2023-03-07 MED ORDER — MELOXICAM 7.5 MG PO TABS
7.5000 mg | ORAL_TABLET | Freq: Once | ORAL | Status: AC
  Administered 2023-03-07: 7.5 mg via ORAL
  Filled 2023-03-07: qty 1

## 2023-03-07 MED ORDER — OXYCODONE-ACETAMINOPHEN 5-325 MG PO TABS: 1.0000 | ORAL_TABLET | ORAL | 0 refills | Status: DC | PRN

## 2023-03-07 MED ORDER — OXYCODONE-ACETAMINOPHEN 5-325 MG PO TABS
1.0000 | ORAL_TABLET | Freq: Once | ORAL | Status: AC
  Administered 2023-03-07: 1 via ORAL
  Filled 2023-03-07: qty 1

## 2023-03-07 MED ORDER — MELOXICAM 7.5 MG PO TABS: 7.5000 mg | ORAL_TABLET | Freq: Every day | ORAL | 0 refills | Status: DC | PRN

## 2023-03-07 NOTE — ED Triage Notes (Signed)
Patient ambulatory to triage with steady gait, without difficulty or distress noted; pt reports having a fall few wks ago and has been seeing a chiropractor; c/o neck pain with ROM and bilat shoulder pain "that shoots down to legs"

## 2023-03-07 NOTE — ED Provider Notes (Signed)
[facility] Provider Note    Event Date/Time   First MD Initiated Contact with Patient 03/07/23 0542     (approximate)   History   Torticollis   HPI  Ruth [name] is a 77 y.o. female who presents to the ED from home with a chief complaint of left shoulder pain.  Patient fell a few weeks ago, did not seek medical evaluation.  Has been having difficulty and pain moving her left shoulder.  Has been seeing a chiropractor who "popped" her right shoulder.  Denies neck pain, headache, chest pain, shortness of breath, nausea, vomiting or dizziness.     Past Medical History   Past Medical History:  Diagnosis Date   Allergy    mycins; cause diarrhea   Arthritis    since 2007/ back   Asthma    allergy related/ worse in spring   Breast screening, unspecified    Family history of colonic polyps    GERD (gastroesophageal reflux disease)    Headache    every morning   History of cystitis 1987   Hypertension    since 2005   Neuropathy    feet and legs/  nerve damage from a fall   Osteoarthritis    Parathyroid disorder (HCC) 2008   surgery   Restless leg syndrome    Scoliosis    Sleep apnea    CPAP used for a year and not now/ after official test told she didnt have it / test negative   Trigger finger of right hand    Ulcer 1966   Vertigo    hx of     Active Problem List   Patient Active Problem List   Diagnosis Date Noted   Encounter for screening colonoscopy 08/08/2022   Senile purpura (HCC) 04/29/2022   Peripheral polyneuropathy 04/29/2022   Asthma, well controlled 04/29/2022   Moderate persistent asthma with acute exacerbation 12/26/2021   OSA on CPAP 04/15/2019   B12 deficiency 12/09/2018   Dyslipidemia 11/08/2017   Vitamin D deficiency 10/31/2016   Osteopenia of left hip 10/31/2016   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   BPV (benign positional vertigo) 04/17/2016   RLS (restless legs syndrome) 04/17/2016   GERD  (gastroesophageal reflux disease) 03/11/2016   History of hyperparathyroidism 03/11/2016   History of gastric ulcer 03/11/2016   Essential hypertension 04/24/2015   Primary osteoarthritis involving multiple joints 04/24/2015   Lumbago 04/24/2015   Family history of colonic polyps      Past Surgical History   Past Surgical History:  Procedure Laterality Date   BREAST BIOPSY Left 01/22/2023   stereo bx, distortion w/ calcs, RIBBON clip-path pending   BREAST BIOPSY Left 01/22/2023   MM LT BREAST BX W LOC DEV 1ST LESION IMAGE BX SPEC STEREO GUIDE 01/22/2023 [facility]   CARPAL TUNNEL RELEASE Bilateral 2000   CATARACT EXTRACTION W/PHACO Left 04/18/2020   Procedure: CATARACT EXTRACTION PHACO AND INTRAOCULAR LENS PLACEMENT (IOC) LEFT 4.78  00:32.8;  Surgeon: Galen Manila, MD;  Location: [facility];  Service: Ophthalmology;  Laterality: Left;   CATARACT EXTRACTION W/PHACO Right 05/09/2020   Procedure: CATARACT EXTRACTION PHACO AND INTRAOCULAR LENS PLACEMENT (IOC) RIGHT 3.41 00:21.8;  Surgeon: Galen Manila, MD;  Location: [facility];  Service: Ophthalmology;  Laterality: Right;   COLONOSCOPY  06/17/2012   A few two mm ulcers were found in the sigmoid colon, no bleeding present,bx taken-path reporet on the few tiny ulcers ,non specific   COLONOSCOPY WITH PROPOFOL N/A 06/11/2017  Procedure: COLONOSCOPY WITH PROPOFOL;  Surgeon: Kieth Brightly, MD;  Location: [facility];  Service: Endoscopy;  Laterality: N/A;   COLONOSCOPY WITH PROPOFOL N/A 08/08/2022   Procedure: COLONOSCOPY WITH PROPOFOL;  Surgeon: Toney Reil, MD;  Location: [facility];  Service: Endoscopy;  Laterality: N/A;   parathyroid surgery  2008   TEMPOROMANDIBULAR JOINT ARTHROPLASTY Left 1984   1989,1990-bilateral   TUBAL LIGATION       Home Medications   Prior to Admission medications   Medication Sig Start Date End Date Taking? Authorizing Provider  meloxicam (MOBIC)  7.5 MG tablet Take 1 tablet (7.5 mg total) by mouth daily as needed for pain. 03/07/23  Yes Irean Hong, MD  oxyCODONE-acetaminophen (PERCOCET/ROXICET) 5-325 MG tablet Take 1 tablet by mouth every 4 (four) hours as needed for severe pain. 03/07/23  Yes Irean Hong, MD  acetaminophen (TYLENOL) 500 MG tablet Take 1-2 tablets (500-1,000 mg total) by mouth every 8 (eight) hours as needed. 10/31/16   Alba Cory, MD  Calcium-Vitamin D 600-200 MG-UNIT tablet Take 1 tablet by mouth 2 (two) times daily.     [provider]  Cholecalciferol (VITAMIN D3) 1000 units CAPS Take by mouth.    [provider]  Cod Liver Oil 1000 MG CAPS Take by mouth.    [provider]  Coenzyme Q10 (COQ10) 200 MG CAPS Take 200 mg by mouth daily.    [provider]  fluticasone furoate-vilanterol (BREO ELLIPTA) 200-25 MCG/ACT AEPB Inhale 1 puff into the lungs daily.    Sidney Ace, MD  Garlic 1000 MG CAPS Take 1,000 mg by mouth.     [provider]  L-Lysine 500 MG TABS Take by mouth.    [provider]  levocetirizine (XYZAL) 5 MG tablet Take 1 tablet (5 mg total) by mouth every evening. 09/17/22   Danelle Berry, PA-C  magnesium oxide (MAG-OX) 400 MG tablet Take 500 mg by mouth daily.     [provider]  metoprolol tartrate (LOPRESSOR) 100 MG tablet Take 1 tablet (100 mg total) by mouth once for 1 dose. Take 90-120 minutes prior to scan. 02/24/23 02/24/23  Debbe Odea, MD  mometasone (NASONEX) 50 MCG/ACT nasal spray Place 2 sprays into the nose daily. 09/27/22   Danelle Berry, PA-C  montelukast (SINGULAIR) 10 MG tablet Take 1 tablet (10 mg total) by mouth at bedtime. 09/17/22   Danelle Berry, PA-C  Nutritional Supplements (JOINT FORMULA PO) Take 2 tablets by mouth daily. glucosamine    [provider]  olmesartan-hydrochlorothiazide (BENICAR HCT) 40-25 MG tablet Take 1 tablet by mouth daily. 10/30/22   Alba Cory, MD  omeprazole (PRILOSEC) 40 MG  capsule Take 1 capsule (40 mg total) by mouth daily. 10/30/22   Alba Cory, MD  potassium chloride SA (KLOR-CON M) 20 MEQ tablet Take 1 tablet (20 mEq total) by mouth daily. 11/01/22   Alba Cory, MD  pravastatin (PRAVACHOL) 20 MG tablet Take 1 tablet (20 mg total) by mouth daily. 10/30/22   Alba Cory, MD  rOPINIRole (REQUIP) 1 MG tablet Take 1 tablet (1 mg total) by mouth at bedtime. 10/30/22   Alba Cory, MD     Allergies  Ace inhibitors, Aspirin, and Crestor [rosuvastatin]   Family History   Family History  Adopted: Yes  Problem Relation Age of Onset   Kidney disease Mother    Ovarian cancer Mother    Hypertension Father    Stroke Father    Lupus Sister  Breast cancer Maternal Grandmother    Breast cancer Cousin        pat cousins     Physical Exam  Triage Vital Signs: ED Triage Vitals  Enc Vitals Group     BP 03/07/23 0422 (!) 148/86     Pulse Rate 03/07/23 0422 76     Resp 03/07/23 0422 19     Temp 03/07/23 0422 98.2 F (36.8 C)     Temp Source 03/07/23 0422 Oral     SpO2 03/07/23 0422 100 %     Weight 03/07/23 0419 142 lb (64.4 kg)     Height 03/07/23 0419 5' (1.524 m)     Head Circumference --      Peak Flow --      Pain Score 03/07/23 0419 10     Pain Loc --      Pain Edu? --      Excl. in GC? --     Updated Vital Signs: BP (!) 148/86 (BP Location: Left Arm)   Pulse 76   Temp 98.2 F (36.8 C) (Oral)   Resp 19   Ht 5' (1.524 m)   Wt 64.4 kg   SpO2 100%   BMI 27.73 kg/m    General: Awake, no distress.  CV:  Good peripheral perfusion.  Resp:  Normal effort.  Abd:  No distention.  Other:  No carotid bruits.  No midline cervical spine tenderness to palpation.  Left trapezius muscle tightness.  Anterior shoulder tenderness to palpation.  Limited range of motion, cannot lift shoulder overhead secondary to pain.  2+ radial pulses.  Brisk, less than 5-second cap refill.   ED Results / Procedures / Treatments  Labs (all labs ordered  are listed, but only abnormal results are displayed) Labs Reviewed - No data to display   EKG  None   RADIOLOGY I have independently visualized and interpreted patient's x-ray as well as noted the radiology interpretation:  Left shoulder x-ray: Negative  Official radiology report(s): DG Shoulder Left  Result Date: 03/07/2023 CLINICAL DATA:  77 year old female with history of trauma from a fall complaining of left shoulder pain. EXAM: LEFT SHOULDER - 2+ VIEW COMPARISON:  No priors. FINDINGS: There is no evidence of fracture or dislocation. There is no evidence of arthropathy or other focal bone abnormality. Soft tissues are unremarkable. IMPRESSION: Negative. Electronically Signed   By: Trudie Reed M.D.   On: 03/07/2023 06:18     PROCEDURES:  Critical Care performed: No  Procedures   MEDICATIONS ORDERED IN ED: Medications  oxyCODONE-acetaminophen (PERCOCET/ROXICET) 5-325 MG per tablet 1 tablet (1 tablet Oral Given 03/07/23 0607)  meloxicam (MOBIC) tablet 7.5 mg (7.5 mg Oral Given 03/07/23 0608)     IMPRESSION / MDM / ASSESSMENT AND PLAN / ED COURSE  I reviewed the triage vital signs and the nursing notes.                             77 year old female presenting with left shoulder pain status post fall several weeks ago.  Will obtain x-rays.  Administer meloxicam which patient has taken be previously with good effect, Percocet and reassess.  Patient's presentation is most consistent with acute, uncomplicated illness.  Clinical Course as of 03/07/23 0644  Fri Mar 07, 2023  1610 X-rays negative.  Will discharge home with as needed NSAIDs, analgesia and patient will follow-up with orthopedics.  Strict return precautions given.  Patient verbalizes understanding and agrees  with plan of care. [JS]    Clinical Course User Index [JS] Irean Hong, MD     FINAL CLINICAL IMPRESSION(S) / ED DIAGNOSES   Final diagnoses:  Acute pain of left shoulder     Rx / DC Orders    ED Discharge Orders          Ordered    meloxicam (MOBIC) 7.5 MG tablet  Daily PRN        03/07/23 0641    oxyCODONE-acetaminophen (PERCOCET/ROXICET) 5-325 MG tablet  Every 4 hours PRN        03/07/23 0641             Note:  This document was prepared using Dragon voice recognition software and may include unintentional dictation errors.   Irean Hong, MD 03/07/23 [phone]

## 2023-03-07 NOTE — Discharge Instructions (Signed)
Wear sling as needed for comfort.  You may take Meloxicam and Percocet as needed for pain.  Return to the ER for worsening symptoms, persistent vomiting, difficulty breathing or other concerns.

## 2023-03-10 DIAGNOSIS — M9903 Segmental and somatic dysfunction of lumbar region: Secondary | ICD-10-CM | POA: Diagnosis not present

## 2023-03-10 DIAGNOSIS — M9901 Segmental and somatic dysfunction of cervical region: Secondary | ICD-10-CM | POA: Diagnosis not present

## 2023-03-10 DIAGNOSIS — R519 Headache, unspecified: Secondary | ICD-10-CM | POA: Diagnosis not present

## 2023-03-10 DIAGNOSIS — M5033 Other cervical disc degeneration, cervicothoracic region: Secondary | ICD-10-CM | POA: Diagnosis not present

## 2023-03-12 ENCOUNTER — Telehealth: Payer: Self-pay | Admitting: *Deleted

## 2023-03-12 DIAGNOSIS — M5033 Other cervical disc degeneration, cervicothoracic region: Secondary | ICD-10-CM | POA: Diagnosis not present

## 2023-03-12 DIAGNOSIS — M9903 Segmental and somatic dysfunction of lumbar region: Secondary | ICD-10-CM | POA: Diagnosis not present

## 2023-03-12 DIAGNOSIS — R519 Headache, unspecified: Secondary | ICD-10-CM | POA: Diagnosis not present

## 2023-03-12 DIAGNOSIS — M9901 Segmental and somatic dysfunction of cervical region: Secondary | ICD-10-CM | POA: Diagnosis not present

## 2023-03-12 NOTE — Telephone Encounter (Signed)
Transition Care Management Follow-up Telephone Call Date of discharge and from where: [facility] 03/07/2023 How have you been since you were released from the hospital? Pain is much better . relaxing the muscles  Any questions or concerns? No  Items Reviewed: Did the pt receive and understand the discharge instructions provided? Yes  Medications obtained and verified? No  Other? No  Any new allergies since your discharge? No  Dietary orders reviewed? No Do you have support at home? Yes    Follow up appointments reviewed:  PCP Hospital f/u appt confirmed? Yes  Scheduled to see in August  Are transportation arrangements needed? No  If their condition worsens, is the pt aware to call PCP or go to the Emergency Dept.? Yes Was the patient provided with contact information for the PCP's office or ED? Yes Was to pt encouraged to call back with questions or concerns? Yes

## 2023-03-24 DIAGNOSIS — I2089 Other forms of angina pectoris: Secondary | ICD-10-CM | POA: Diagnosis not present

## 2023-03-24 DIAGNOSIS — E6609 Other obesity due to excess calories: Secondary | ICD-10-CM | POA: Diagnosis not present

## 2023-03-24 DIAGNOSIS — K219 Gastro-esophageal reflux disease without esophagitis: Secondary | ICD-10-CM | POA: Diagnosis not present

## 2023-03-24 DIAGNOSIS — R55 Syncope and collapse: Secondary | ICD-10-CM | POA: Diagnosis not present

## 2023-03-24 DIAGNOSIS — J45909 Unspecified asthma, uncomplicated: Secondary | ICD-10-CM | POA: Diagnosis not present

## 2023-03-24 DIAGNOSIS — I10 Essential (primary) hypertension: Secondary | ICD-10-CM | POA: Diagnosis not present

## 2023-04-02 ENCOUNTER — Other Ambulatory Visit: Payer: Self-pay | Admitting: Family Medicine

## 2023-04-02 DIAGNOSIS — J45998 Other asthma: Secondary | ICD-10-CM

## 2023-04-02 DIAGNOSIS — J069 Acute upper respiratory infection, unspecified: Secondary | ICD-10-CM

## 2023-04-02 DIAGNOSIS — J4540 Moderate persistent asthma, uncomplicated: Secondary | ICD-10-CM

## 2023-04-23 ENCOUNTER — Other Ambulatory Visit: Payer: Self-pay | Admitting: Family Medicine

## 2023-04-23 DIAGNOSIS — E785 Hyperlipidemia, unspecified: Secondary | ICD-10-CM

## 2023-04-28 ENCOUNTER — Other Ambulatory Visit: Payer: Self-pay | Admitting: Family Medicine

## 2023-04-28 DIAGNOSIS — I10 Essential (primary) hypertension: Secondary | ICD-10-CM

## 2023-05-01 ENCOUNTER — Other Ambulatory Visit: Payer: Self-pay | Admitting: Family Medicine

## 2023-05-01 DIAGNOSIS — R928 Other abnormal and inconclusive findings on diagnostic imaging of breast: Secondary | ICD-10-CM

## 2023-05-01 NOTE — Progress Notes (Deleted)
Name: Ruth [name]   MRN: [id number]    DOB: 1946-06-01   Date:05/01/2023       Progress Note  Subjective  Chief Complaint  Follow Up  HPI  History of OSA  she sees Dr. Buffalo Callas, she started CPAP machine in  May 2020, she stopped using it months ago because of RLS and neuropathy that kept her awake at night Repeat facility sleep study was negative and she has been doing well    GERD: tried to stop omeprazole but unable to tolerated symptoms without medication, she has been unable to take wean down to every other day. Unchanged   Polyneuropathy: seen by Dr. Sherryll Burger in the past , she is only taking Requip and staying hydrated and symptoms have improved    HTN: she is only on Benicar HCTZ 40/25 mg daily . Denies chest pain, palpitation or sob. Doing well on current regiment    Dyslipidemia:We started her on medications last year , last LDL was 52, continue medications. Recheck levels today   Vitamin D deficiency: taking otc supplements and last level was at goal , we will recheck labs     Asthma: under the care of Dr. Fincastle Callas, she states well controlled with medication, no wheezing or SOB but she has an occasional cough. Currently using Advair prn   B12 : last level was high and she stopped taking it completely, we will recheck level . She denies fatigue   Senile purpura: stable and reassurance given   Headache: she wakes up with a dull headache 4/10 , it improves as she moves around and drinks a cup of coffee. She drinks at most 1 -2 cups per day, no other caffeinate  drinks throughout the day    Osteopenia: reviewed results, improved on hip and spine  Positive impingement right shoulder: going on for years, decrease in abduction and internal rotation, offered PT but she wants to do it at home    Patient Active Problem List   Diagnosis Date Noted   Encounter for screening colonoscopy 08/08/2022   Senile purpura (HCC) 04/29/2022   Peripheral polyneuropathy 04/29/2022   Asthma, well  controlled 04/29/2022   Moderate persistent asthma with acute exacerbation 12/26/2021   OSA on CPAP 04/15/2019   B12 deficiency 12/09/2018   Dyslipidemia 11/08/2017   Vitamin D deficiency 10/31/2016   Osteopenia of left hip 10/31/2016   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   BPV (benign positional vertigo) 04/17/2016   RLS (restless legs syndrome) 04/17/2016   GERD (gastroesophageal reflux disease) 03/11/2016   History of hyperparathyroidism 03/11/2016   History of gastric ulcer 03/11/2016   Essential hypertension 04/24/2015   Primary osteoarthritis involving multiple joints 04/24/2015   Lumbago 04/24/2015   Family history of colonic polyps     Past Surgical History:  Procedure Laterality Date   BREAST BIOPSY Left 01/22/2023   stereo bx, distortion w/ calcs, RIBBON clip-path pending   BREAST BIOPSY Left 01/22/2023   MM LT BREAST BX W LOC DEV 1ST LESION IMAGE BX SPEC STEREO GUIDE 01/22/2023 [facility]   CARPAL TUNNEL RELEASE Bilateral 2000   CATARACT EXTRACTION W/PHACO Left 04/18/2020   Procedure: CATARACT EXTRACTION PHACO AND INTRAOCULAR LENS PLACEMENT (IOC) LEFT 4.78  00:32.8;  Surgeon: Galen Manila, MD;  Location: [facility];  Service: Ophthalmology;  Laterality: Left;   CATARACT EXTRACTION W/PHACO Right 05/09/2020   Procedure: CATARACT EXTRACTION PHACO AND INTRAOCULAR LENS PLACEMENT (IOC) RIGHT 3.41 00:21.8;  Surgeon: Galen Manila, MD;  Location: [facility];  Service:  Ophthalmology;  Laterality: Right;   COLONOSCOPY  06/17/2012   A few two mm ulcers were found in the sigmoid colon, no bleeding present,bx taken-path reporet on the few tiny ulcers ,non specific   COLONOSCOPY WITH PROPOFOL N/A 06/11/2017   Procedure: COLONOSCOPY WITH PROPOFOL;  Surgeon: Kieth Brightly, MD;  Location: [facility];  Service: Endoscopy;  Laterality: N/A;   COLONOSCOPY WITH PROPOFOL N/A 08/08/2022   Procedure: COLONOSCOPY WITH PROPOFOL;  Surgeon: Toney Reil, MD;  Location: [facility];  Service: Endoscopy;  Laterality: N/A;   parathyroid surgery  2008   TEMPOROMANDIBULAR JOINT ARTHROPLASTY Left 1984   1989,1990-bilateral   TUBAL LIGATION      Family History  Adopted: Yes  Problem Relation Age of Onset   Kidney disease Mother    Ovarian cancer Mother    Hypertension Father    Stroke Father    Lupus Sister    Breast cancer Maternal Grandmother    Breast cancer Cousin        pat cousins    Social History   Tobacco Use   Smoking status: Never   Smokeless tobacco: Never  Substance Use Topics   Alcohol use: Yes    Alcohol/week: 3.0 standard drinks of alcohol    Types: 3 Glasses of wine per week    Comment: wine     Current Outpatient Medications:    acetaminophen (TYLENOL) 500 MG tablet, Take 1-2 tablets (500-1,000 mg total) by mouth every 8 (eight) hours as needed., Disp: 100 tablet, Rfl: 2   Calcium-Vitamin D 600-200 MG-UNIT tablet, Take 1 tablet by mouth 2 (two) times daily. , Disp: , Rfl:    Cholecalciferol (VITAMIN D3) 1000 units CAPS, Take by mouth., Disp: , Rfl:    Cod Liver Oil 1000 MG CAPS, Take by mouth., Disp: , Rfl:    Coenzyme Q10 (COQ10) 200 MG CAPS, Take 200 mg by mouth daily., Disp: , Rfl:    fluticasone furoate-vilanterol (BREO ELLIPTA) 200-25 MCG/ACT AEPB, Inhale 1 puff into the lungs daily., Disp: , Rfl:    Garlic 1000 MG CAPS, Take 1,000 mg by mouth. , Disp: , Rfl:    L-Lysine 500 MG TABS, Take by mouth., Disp: , Rfl:    levocetirizine (XYZAL) 5 MG tablet, TAKE 1 TABLET BY MOUTH ONCE DAILY IN THE EVENING, Disp: 90 tablet, Rfl: 0   magnesium oxide (MAG-OX) 400 MG tablet, Take 500 mg by mouth daily. , Disp: , Rfl:    meloxicam (MOBIC) 7.5 MG tablet, Take 1 tablet (7.5 mg total) by mouth daily as needed for pain., Disp: 7 tablet, Rfl: 0   metoprolol tartrate (LOPRESSOR) 100 MG tablet, Take 1 tablet (100 mg total) by mouth once for 1 dose. Take 90-120 minutes prior to scan., Disp: 1 tablet, Rfl: 0    mometasone (NASONEX) 50 MCG/ACT nasal spray, Place 2 sprays into the nose daily., Disp: 1 each, Rfl: 12   montelukast (SINGULAIR) 10 MG tablet, Take 1 tablet (10 mg total) by mouth at bedtime., Disp: 90 tablet, Rfl: 1   Nutritional Supplements (JOINT FORMULA PO), Take 2 tablets by mouth daily. glucosamine, Disp: , Rfl:    olmesartan-hydrochlorothiazide (BENICAR HCT) 40-25 MG tablet, Take 1 tablet by mouth once daily, Disp: 90 tablet, Rfl: 0   omeprazole (PRILOSEC) 40 MG capsule, Take 1 capsule (40 mg total) by mouth daily., Disp: 90 capsule, Rfl: 1   oxyCODONE-acetaminophen (PERCOCET/ROXICET) 5-325 MG tablet, Take 1 tablet by mouth every 4 (four) hours as needed for  severe pain., Disp: 15 tablet, Rfl: 0   potassium chloride SA (KLOR-CON M) 20 MEQ tablet, Take 1 tablet (20 mEq total) by mouth daily., Disp: 5 tablet, Rfl: 0   pravastatin (PRAVACHOL) 20 MG tablet, Take 1 tablet by mouth once daily, Disp: 90 tablet, Rfl: 0   rOPINIRole (REQUIP) 1 MG tablet, Take 1 tablet (1 mg total) by mouth at bedtime., Disp: 90 tablet, Rfl: 1  Allergies  Allergen Reactions   Ace Inhibitors Cough   Aspirin Nausea Only    Abdominal pain   Crestor [Rosuvastatin]     Muscle pain     I personally reviewed active problem list, medication list, allergies, family history, social history, health maintenance with the patient/caregiver today.   ROS  ***  Objective  There were no vitals filed for this visit.  There is no height or weight on file to calculate BMI.  Physical Exam ***  No results found for this or any previous visit (from the past 2160 hour(s)).   PHQ2/9:    02/03/2023    9:13 AM 01/24/2023   11:21 AM 01/03/2023   11:21 AM 10/30/2022   10:40 AM 09/27/2022   11:40 AM  Depression screen PHQ 2/9  Decreased Interest 0 0 0 0 0  Down, Depressed, Hopeless 0 0 0 0 0  PHQ - 2 Score 0 0 0 0 0  Altered sleeping 0 0  0 0  Tired, decreased energy 0 0  0 0  Change in appetite 0 0  0 0  Feeling bad or  failure about yourself  0 0  0 0  Trouble concentrating 0 0  0 0  Moving slowly or fidgety/restless 0 0  0 0  Suicidal thoughts 0 0  0 0  PHQ-9 Score 0 0  0 0  Difficult doing work/chores  Not difficult at all   Not difficult at all    phq 9 is {gen pos [id number]}   Fall Risk:    02/03/2023    9:13 AM 01/24/2023   11:21 AM 01/03/2023   11:13 AM 10/30/2022   10:39 AM 09/27/2022   11:40 AM  Fall Risk   Falls in the past year? 0 0 0 1 1  Number falls in past yr: 0 0 0 1 1  Injury with Fall? 0 0 0 0 0  Risk for fall due to : No Fall Risks No Fall Risks No Fall Risks History of fall(s) Impaired balance/gait  Follow up Falls prevention discussed Falls prevention discussed;Education provided;Falls evaluation completed Education provided;Falls prevention discussed Falls prevention discussed;Falls evaluation completed;Education provided Falls prevention discussed;Education provided;Falls evaluation completed      Functional Status Survey:      Assessment & Plan  *** There are no diagnoses linked to this encounter.

## 2023-05-02 ENCOUNTER — Ambulatory Visit: Payer: Medicare HMO | Admitting: Family Medicine

## 2023-05-09 NOTE — Progress Notes (Unsigned)
Name: Ruth [name]   MRN: [id number]    DOB: Feb 02, 1946   Date:05/12/2023       Progress Note  Subjective  Chief Complaint  Follow Up  HPI  History of Syncope: seen by Dr. Juliann Pares, symptoms resolves, studies negative and she was given reassurance   Cardiac Calcium score : 02/2023 Coronary calcium score of 86.7. This was 55th percentile for age  and sex matched control.  Holter doppler unremarkable 01/2023  Echocardiogram 2D:  INTERPRETATION 02/11/2023 NORMAL LEFT VENTRICULAR SYSTOLIC FUNCTION WITH AN ESTIMATED EF = >55 % NORMAL RIGHT VENTRICULAR SYSTOLIC FUNCTION MILD TRICUSPID AND AORTIC VALVE INSUFFICIENCY TRACE MITRAL VALVE INSUFFICIENCY NO VALVULAR STENOSIS GLS: -20.3 %    GERD: tried to stop omeprazole but unable to tolerated symptoms without medication, she has been unable to take wean down to every other day. Symptoms are controlled with medications   Polyneuropathy: seen by Dr. Sherryll Burger in the past , she is only taking Requip and staying hydrated and symptoms are under control now    HTN: she is only on Benicar HCTZ 40/25 mg daily . Denies chest pain, palpitation or sob. Doing well on current regiment , BP is at goal    Dyslipidemia:We started her on medications in 2023 , last LDL was 43 , continue medications.   Vitamin D deficiency: taking otc supplements and last level was at goal , we will recheck labs yearly     Asthma: under the care of Dr. Corona Callas, she states well controlled with medication, no wheezing or SOB but she has an occasional cough. Currently using Breo , Xyzal   B12 : lshe is taking supplementation otc   Senile purpura: stable and reassurance given , unsure   Headache: she wakes up with a dull headache 4/10 , it improves as she moves around and drinks a cup of coffee. She drinks at most 1 -2 cups per day, no other caffeinate  drinks throughout the day    Osteopenia: reviewed results, improved on hip and spine  Intermittent low back pain: taking  tylenol prn, usually mild but sometimes goes up to 8/10, does not want a muscle relaxer at this time   Positive impingement right shoulder: going on for years, decrease in abduction and internal rotation, offered PT but she wants to do it at home    Patient Active Problem List   Diagnosis Date Noted   Encounter for screening colonoscopy 08/08/2022   Senile purpura (HCC) 04/29/2022   Peripheral polyneuropathy 04/29/2022   Asthma, well controlled 04/29/2022   Moderate persistent asthma with acute exacerbation 12/26/2021   OSA on CPAP 04/15/2019   B12 deficiency 12/09/2018   Dyslipidemia 11/08/2017   Vitamin D deficiency 10/31/2016   Osteopenia of left hip 10/31/2016   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   BPV (benign positional vertigo) 04/17/2016   RLS (restless legs syndrome) 04/17/2016   GERD (gastroesophageal reflux disease) 03/11/2016   History of hyperparathyroidism 03/11/2016   History of gastric ulcer 03/11/2016   Essential hypertension 04/24/2015   Primary osteoarthritis involving multiple joints 04/24/2015   Lumbago 04/24/2015   Family history of colonic polyps     Past Surgical History:  Procedure Laterality Date   BREAST BIOPSY Left 01/22/2023   stereo bx, distortion w/ calcs, RIBBON clip-path pending   BREAST BIOPSY Left 01/22/2023   MM LT BREAST BX W LOC DEV 1ST LESION IMAGE BX SPEC STEREO GUIDE 01/22/2023 [facility]   CARPAL TUNNEL RELEASE Bilateral 2000   CATARACT EXTRACTION W/PHACO Left 04/18/2020  Procedure: CATARACT EXTRACTION PHACO AND INTRAOCULAR LENS PLACEMENT (IOC) LEFT 4.78  00:32.8;  Surgeon: Galen Manila, MD;  Location: [facility];  Service: Ophthalmology;  Laterality: Left;   CATARACT EXTRACTION W/PHACO Right 05/09/2020   Procedure: CATARACT EXTRACTION PHACO AND INTRAOCULAR LENS PLACEMENT (IOC) RIGHT 3.41 00:21.8;  Surgeon: Galen Manila, MD;  Location: [facility];  Service: Ophthalmology;  Laterality: Right;    COLONOSCOPY  06/17/2012   A few two mm ulcers were found in the sigmoid colon, no bleeding present,bx taken-path reporet on the few tiny ulcers ,non specific   COLONOSCOPY WITH PROPOFOL N/A 06/11/2017   Procedure: COLONOSCOPY WITH PROPOFOL;  Surgeon: Kieth Brightly, MD;  Location: [facility];  Service: Endoscopy;  Laterality: N/A;   COLONOSCOPY WITH PROPOFOL N/A 08/08/2022   Procedure: COLONOSCOPY WITH PROPOFOL;  Surgeon: Toney Reil, MD;  Location: [facility];  Service: Endoscopy;  Laterality: N/A;   parathyroid surgery  2008   TEMPOROMANDIBULAR JOINT ARTHROPLASTY Left 1984   1989,1990-bilateral   TUBAL LIGATION      Family History  Adopted: Yes  Problem Relation Age of Onset   Kidney disease Mother    Ovarian cancer Mother    Hypertension Father    Stroke Father    Lupus Sister    Breast cancer Maternal Grandmother    Breast cancer Cousin        pat cousins    Social History   Tobacco Use   Smoking status: Never   Smokeless tobacco: Never  Substance Use Topics   Alcohol use: Yes    Alcohol/week: 3.0 standard drinks of alcohol    Types: 3 Glasses of wine per week    Comment: wine     Current Outpatient Medications:    acetaminophen (TYLENOL) 500 MG tablet, Take 1-2 tablets (500-1,000 mg total) by mouth every 8 (eight) hours as needed., Disp: 100 tablet, Rfl: 2   Calcium-Vitamin D 600-200 MG-UNIT tablet, Take 1 tablet by mouth 2 (two) times daily. , Disp: , Rfl:    Cholecalciferol (VITAMIN D3) 1000 units CAPS, Take by mouth., Disp: , Rfl:    Cod Liver Oil 1000 MG CAPS, Take by mouth., Disp: , Rfl:    Coenzyme Q10 (COQ10) 200 MG CAPS, Take 200 mg by mouth daily., Disp: , Rfl:    fluticasone furoate-vilanterol (BREO ELLIPTA) 200-25 MCG/ACT AEPB, Inhale 1 puff into the lungs daily., Disp: , Rfl:    Garlic 1000 MG CAPS, Take 1,000 mg by mouth. , Disp: , Rfl:    L-Lysine 500 MG TABS, Take by mouth., Disp: , Rfl:    levocetirizine (XYZAL) 5 MG  tablet, TAKE 1 TABLET BY MOUTH ONCE DAILY IN THE EVENING, Disp: 90 tablet, Rfl: 0   magnesium oxide (MAG-OX) 400 MG tablet, Take 500 mg by mouth daily. , Disp: , Rfl:    meloxicam (MOBIC) 7.5 MG tablet, Take 1 tablet (7.5 mg total) by mouth daily as needed for pain., Disp: 7 tablet, Rfl: 0   mometasone (NASONEX) 50 MCG/ACT nasal spray, Place 2 sprays into the nose daily., Disp: 1 each, Rfl: 12   montelukast (SINGULAIR) 10 MG tablet, Take 1 tablet (10 mg total) by mouth at bedtime., Disp: 90 tablet, Rfl: 1   Nutritional Supplements (JOINT FORMULA PO), Take 2 tablets by mouth daily. glucosamine, Disp: , Rfl:    olmesartan-hydrochlorothiazide (BENICAR HCT) 40-25 MG tablet, Take 1 tablet by mouth once daily, Disp: 90 tablet, Rfl: 0   omeprazole (PRILOSEC) 40 MG capsule, Take 1  capsule (40 mg total) by mouth daily., Disp: 90 capsule, Rfl: 1   oxyCODONE-acetaminophen (PERCOCET/ROXICET) 5-325 MG tablet, Take 1 tablet by mouth every 4 (four) hours as needed for severe pain., Disp: 15 tablet, Rfl: 0   potassium chloride SA (KLOR-CON M) 20 MEQ tablet, Take 1 tablet (20 mEq total) by mouth daily., Disp: 5 tablet, Rfl: 0   pravastatin (PRAVACHOL) 20 MG tablet, Take 1 tablet by mouth once daily, Disp: 90 tablet, Rfl: 0   rOPINIRole (REQUIP) 1 MG tablet, Take 1 tablet (1 mg total) by mouth at bedtime., Disp: 90 tablet, Rfl: 1   metoprolol tartrate (LOPRESSOR) 100 MG tablet, Take 1 tablet (100 mg total) by mouth once for 1 dose. Take 90-120 minutes prior to scan., Disp: 1 tablet, Rfl: 0  Allergies  Allergen Reactions   Ace Inhibitors Cough   Aspirin Nausea Only    Abdominal pain   Crestor [Rosuvastatin]     Muscle pain     I personally reviewed active problem list, medication list, allergies, family history, social history, health maintenance with the patient/caregiver today.   ROS  Ten systems reviewed and is negative except as mentioned in HPI    Objective  Vitals:   05/12/23 0939  BP: 126/76   Pulse: 84  Resp: 16  SpO2: 95%  Weight: 141 lb (64 kg)  Height: 5' (1.524 m)    Body mass index is 27.54 kg/m.  Physical Exam  Constitutional: Patient appears well-developed and well-nourished. Obese  No distress.  HEENT: head atraumatic, normocephalic, pupils equal and reactive to light, neck supple Cardiovascular: Normal rate, regular rhythm and normal heart sounds.  No murmur heard. No BLE edema. Pulmonary/Chest: Effort normal and breath sounds normal. No respiratory distress. Abdominal: Soft.  There is no tenderness. Psychiatric: Patient has a normal mood and affect. behavior is normal. Judgment and thought content normal.    PHQ2/9:    05/12/2023    9:39 AM 02/03/2023    9:13 AM 01/24/2023   11:21 AM 01/03/2023   11:21 AM 10/30/2022   10:40 AM  Depression screen PHQ 2/9  Decreased Interest 0 0 0 0 0  Down, Depressed, Hopeless 0 0 0 0 0  PHQ - 2 Score 0 0 0 0 0  Altered sleeping 0 0 0  0  Tired, decreased energy 0 0 0  0  Change in appetite 0 0 0  0  Feeling bad or failure about yourself  0 0 0  0  Trouble concentrating 0 0 0  0  Moving slowly or fidgety/restless 0 0 0  0  Suicidal thoughts 0 0 0  0  PHQ-9 Score 0 0 0  0  Difficult doing work/chores   Not difficult at all      phq 9 is negative   Fall Risk:    05/12/2023    9:38 AM 02/03/2023    9:13 AM 01/24/2023   11:21 AM 01/03/2023   11:13 AM 10/30/2022   10:39 AM  Fall Risk   Falls in the past year? 0 0 0 0 1  Number falls in past yr: 0 0 0 0 1  Injury with Fall? 0 0 0 0 0  Risk for fall due to : No Fall Risks No Fall Risks No Fall Risks No Fall Risks History of fall(s)  Follow up Falls prevention discussed Falls prevention discussed Falls prevention discussed;Education provided;Falls evaluation completed Education provided;Falls prevention discussed Falls prevention discussed;Falls evaluation completed;Education provided  Functional Status Survey: Is the patient deaf or have difficulty hearing?:  No Does the patient have difficulty seeing, even when wearing glasses/contacts?: No Does the patient have difficulty concentrating, remembering, or making decisions?: No Does the patient have difficulty walking or climbing stairs?: No Does the patient have difficulty dressing or bathing?: No Does the patient have difficulty doing errands alone such as visiting a doctor's office or shopping?: No    Assessment & Plan  1. Essential hypertension  - olmesartan-hydrochlorothiazide (BENICAR HCT) 40-25 MG tablet; Take 1 tablet by mouth daily.  Dispense: 90 tablet; Refill: 1  2. Gastroesophageal reflux disease without esophagitis  - omeprazole (PRILOSEC) 40 MG capsule; Take 1 capsule (40 mg total) by mouth daily.  Dispense: 90 capsule; Refill: 1  3. Dyslipidemia  - pravastatin (PRAVACHOL) 20 MG tablet; Take 1 tablet (20 mg total) by mouth daily.  Dispense: 90 tablet; Refill: 1  4. RLS (restless legs syndrome)  - rOPINIRole (REQUIP) 1 MG tablet; Take 1 tablet (1 mg total) by mouth at bedtime.  Dispense: 90 tablet; Refill: 1  5. Senile purpura (HCC)   Reassurance given

## 2023-05-12 ENCOUNTER — Ambulatory Visit (INDEPENDENT_AMBULATORY_CARE_PROVIDER_SITE_OTHER): Payer: Medicare HMO | Admitting: Family Medicine

## 2023-05-12 ENCOUNTER — Encounter: Payer: Self-pay | Admitting: Family Medicine

## 2023-05-12 VITALS — BP 126/76 | HR 84 | Resp 16 | Ht 60.0 in | Wt 141.0 lb

## 2023-05-12 DIAGNOSIS — D692 Other nonthrombocytopenic purpura: Secondary | ICD-10-CM | POA: Diagnosis not present

## 2023-05-12 DIAGNOSIS — E785 Hyperlipidemia, unspecified: Secondary | ICD-10-CM

## 2023-05-12 DIAGNOSIS — I10 Essential (primary) hypertension: Secondary | ICD-10-CM

## 2023-05-12 DIAGNOSIS — K219 Gastro-esophageal reflux disease without esophagitis: Secondary | ICD-10-CM

## 2023-05-12 DIAGNOSIS — G2581 Restless legs syndrome: Secondary | ICD-10-CM

## 2023-05-12 MED ORDER — OMEPRAZOLE 40 MG PO CPDR: 40.0000 mg | DELAYED_RELEASE_CAPSULE | Freq: Every day | ORAL | 1 refills | Status: DC

## 2023-05-12 MED ORDER — OLMESARTAN MEDOXOMIL-HCTZ 40-25 MG PO TABS: 1.0000 | ORAL_TABLET | Freq: Every day | ORAL | 1 refills | Status: DC

## 2023-05-12 MED ORDER — PRAVASTATIN SODIUM 20 MG PO TABS: 20.0000 mg | ORAL_TABLET | Freq: Every day | ORAL | 1 refills | Status: DC

## 2023-05-12 MED ORDER — ROPINIROLE HCL 1 MG PO TABS: 1.0000 mg | ORAL_TABLET | Freq: Every day | ORAL | 1 refills | Status: DC

## 2023-07-02 ENCOUNTER — Other Ambulatory Visit: Payer: Self-pay | Admitting: Family Medicine

## 2023-07-02 DIAGNOSIS — J45998 Other asthma: Secondary | ICD-10-CM

## 2023-07-02 DIAGNOSIS — J4540 Moderate persistent asthma, uncomplicated: Secondary | ICD-10-CM

## 2023-07-14 NOTE — Progress Notes (Unsigned)
Name: Ruth [name]   MRN: [id number]    DOB: [birth year]   Date:07/15/2023       Progress Note  Subjective  Chief Complaint  Annual Exam  HPI  Patient presents for annual CPE.  Diet: she cooks at home, eats fish three times a week, fruit and vegetables  Exercise: discussed 150 minutes per week  Last Eye Exam: up to date  Last Dental Exam: she is due for a visit   Flowsheet Row Clinical Support from 01/03/2023 in [facility]  AUDIT-C Score 0      Depression: Phq 9 is  negative    07/15/2023    8:41 AM 05/12/2023    9:39 AM 02/03/2023    9:13 AM 01/24/2023   11:21 AM 01/03/2023   11:21 AM  Depression screen PHQ 2/9  Decreased Interest 0 0 0 0 0  Down, Depressed, Hopeless 0 0 0 0 0  PHQ - 2 Score 0 0 0 0 0  Altered sleeping 0 0 0 0   Tired, decreased energy 0 0 0 0   Change in appetite 0 0 0 0   Feeling bad or failure about yourself  0 0 0 0   Trouble concentrating 0 0 0 0   Moving slowly or fidgety/restless 0 0 0 0   Suicidal thoughts 0 0 0 0   PHQ-9 Score 0 0 0 0   Difficult doing work/chores    Not difficult at all    Hypertension: BP Readings from Last 3 Encounters:  07/15/23 118/74  05/12/23 126/76  03/07/23 (!) 148/86   Obesity: Wt Readings from Last 3 Encounters:  07/15/23 142 lb (64.4 kg)  05/12/23 141 lb (64 kg)  03/07/23 142 lb (64.4 kg)   BMI Readings from Last 3 Encounters:  07/15/23 27.73 kg/m  05/12/23 27.54 kg/m  03/07/23 27.73 kg/m     Vaccines:   RSV: up to date  Tdap: 2013, due - she will get it at local pharmacy  Shingrix: up to date Pneumonia: up to date Flu: up to date COVID-19: up to date   Hep C Screening: 04/10/12 STD testing and prevention (HIV/chl/gon/syphilis): N/A Intimate partner violence: negative screen  Sexual History : occasionally sexually active - husband only - he had prostate cancer and has a pump  Menstrual History/LMP/Abnormal Bleeding: post-menopausal  Discussed importance of  follow up if any post-menopausal bleeding: yes  Incontinence Symptoms: negative for symptoms   Breast cancer:  - Last Mammogram: 12/30/22, she is due for repeat soon due to abnormal screen, biopsy negative  - BRCA gene screening: mother had ovarian cancer, maternal grandmother had breast cancer at a young age   Osteoporosis Prevention : Discussed high calcium and vitamin D supplementation, weight bearing exercises Bone density: Up to date   Cervical cancer screening: N/A  Skin cancer: Discussed monitoring for atypical lesions  Colorectal cancer: 07/2022  Lung cancer:  Low Dose CT Chest recommended if Age 41-80 years, 20 pack-year currently smoking OR have quit w/in 15years. Patient does not qualify for screen   ECG: 01/24/23  Advanced Care Planning: A voluntary discussion about advance care planning including the explanation and discussion of advance directives.  Discussed health care proxy and Living will, and the patient was able to identify a health care proxy as husband .  Patient does not have a living will and power of attorney of health care   Lipids: Lab Results  Component Value Date   CHOL 116 10/30/2022  CHOL 120 10/29/2021   CHOL 124 11/24/2020   Lab Results  Component Value Date   HDL 48 (L) 10/30/2022   HDL 46 (L) 10/29/2021   HDL 53 11/24/2020   Lab Results  Component Value Date   LDLCALC 43 10/30/2022   LDLCALC 52 10/29/2021   LDLCALC 48 11/24/2020   Lab Results  Component Value Date   TRIG 173 (H) 10/30/2022   TRIG 136 10/29/2021   TRIG 142 11/24/2020   Lab Results  Component Value Date   CHOLHDL 2.4 10/30/2022   CHOLHDL 2.6 10/29/2021   CHOLHDL 2.3 11/24/2020   No results found for: "LDLDIRECT"  Glucose: Glucose, Bld  Date Value Ref Range Status  01/24/2023 79 65 - 99 mg/dL Final    Comment:    .            Fasting reference interval .   10/30/2022 119 (H) 65 - 99 mg/dL Final    Comment:    .            Fasting reference interval . For  someone without known diabetes, a glucose value between 100 and 125 mg/dL is consistent with prediabetes and should be confirmed with a follow-up test. .   10/29/2021 88 65 - 99 mg/dL Final    Comment:    .            Fasting reference interval .     Patient Active Problem List   Diagnosis Date Noted   Senile purpura (HCC) 04/29/2022   Peripheral polyneuropathy 04/29/2022   Asthma, well controlled 04/29/2022   Moderate persistent asthma with acute exacerbation 12/26/2021   B12 deficiency 12/09/2018   Dyslipidemia 11/08/2017   Vitamin D deficiency 10/31/2016   Osteopenia of left hip 10/31/2016   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   BPV (benign positional vertigo) 04/17/2016   RLS (restless legs syndrome) 04/17/2016   GERD (gastroesophageal reflux disease) 03/11/2016   History of hyperparathyroidism 03/11/2016   History of gastric ulcer 03/11/2016   Essential hypertension 04/24/2015   Primary osteoarthritis involving multiple joints 04/24/2015   Intermittent low back pain 04/24/2015   Family history of colonic polyps     Past Surgical History:  Procedure Laterality Date   BREAST BIOPSY Left 01/22/2023   stereo bx, distortion w/ calcs, RIBBON clip-path pending   BREAST BIOPSY Left 01/22/2023   MM LT BREAST BX W LOC DEV 1ST LESION IMAGE BX SPEC STEREO GUIDE 01/22/2023 [facility]   CARPAL TUNNEL RELEASE Bilateral 2000   CATARACT EXTRACTION W/PHACO Left 04/18/2020   Procedure: CATARACT EXTRACTION PHACO AND INTRAOCULAR LENS PLACEMENT (IOC) LEFT 4.78  00:32.8;  Surgeon: Galen Manila, MD;  Location: [facility];  Service: Ophthalmology;  Laterality: Left;   CATARACT EXTRACTION W/PHACO Right 05/09/2020   Procedure: CATARACT EXTRACTION PHACO AND INTRAOCULAR LENS PLACEMENT (IOC) RIGHT 3.41 00:21.8;  Surgeon: Galen Manila, MD;  Location: [facility];  Service: Ophthalmology;  Laterality: Right;   COLONOSCOPY  06/17/2012   A few two mm ulcers were  found in the sigmoid colon, no bleeding present,bx taken-path reporet on the few tiny ulcers ,non specific   COLONOSCOPY WITH PROPOFOL N/A 06/11/2017   Procedure: COLONOSCOPY WITH PROPOFOL;  Surgeon: Kieth Brightly, MD;  Location: [facility];  Service: Endoscopy;  Laterality: N/A;   COLONOSCOPY WITH PROPOFOL N/A 08/08/2022   Procedure: COLONOSCOPY WITH PROPOFOL;  Surgeon: Toney Reil, MD;  Location: [facility];  Service: Endoscopy;  Laterality: N/A;   parathyroid surgery  2008  TEMPOROMANDIBULAR JOINT ARTHROPLASTY Left 1984   1989,1990-bilateral   TUBAL LIGATION      Family History  Adopted: Yes  Problem Relation Age of Onset   Kidney disease Mother    Ovarian cancer Mother    Hypertension Father    Stroke Father    Lupus Sister    Breast cancer Maternal Grandmother    Breast cancer Cousin        pat cousins    Social History   Socioeconomic History   Marital status: Married    Spouse name: Arriyanna Argudo    Number of children: 1   Years of education: Not on file   Highest education level: Some college, no degree  Occupational History   Not on file  Tobacco Use   Smoking status: Never   Smokeless tobacco: Never  Vaping Use   Vaping status: Never Used  Substance and Sexual Activity   Alcohol use: Yes    Alcohol/week: 3.0 standard drinks of alcohol    Types: 3 Glasses of wine per week    Comment: wine   Drug use: No   Sexual activity: Yes    Partners: Male    Birth control/protection: Post-menopausal  Other Topics Concern   Not on file  Social History Narrative   Married, retired, involved in church and has one grown son    Social Determinants of Health   Financial Resource Strain: Low Risk  (01/03/2023)   Overall Financial Resource Strain (CARDIA)    Difficulty of Paying Living Expenses: Not hard at all  Food Insecurity: No Food Insecurity (07/15/2023)   Hunger Vital Sign    Worried About Running Out of Food in the Last Year:  Never true    Ran Out of Food in the Last Year: Never true  Transportation Needs: No Transportation Needs (01/03/2023)   PRAPARE - Administrator, Civil Service (Medical): No    Lack of Transportation (Non-Medical): No  Physical Activity: Inactive (01/03/2023)   Exercise Vital Sign    Days of Exercise per Week: 0 days    Minutes of Exercise per Session: 0 min  Stress: No Stress Concern Present (01/03/2023)   [employer] of Occupational Health - Occupational Stress Questionnaire    Feeling of Stress : Not at all  Social Connections: Socially Integrated (01/03/2023)   Social Connection and Isolation Panel [NHANES]    Frequency of Communication with Friends and Family: More than three times a week    Frequency of Social Gatherings with Friends and Family: Three times a week    Attends Religious Services: More than 4 times per year    Active Member of Clubs or Organizations: Yes    Attends Banker Meetings: More than 4 times per year    Marital Status: Married  Catering manager Violence: Not At Risk (07/15/2023)   Humiliation, Afraid, Rape, and Kick questionnaire    Fear of Current or Ex-Partner: No    Emotionally Abused: No    Physically Abused: No    Sexually Abused: No     Current Outpatient Medications:    acetaminophen (TYLENOL) 500 MG tablet, Take 1-2 tablets (500-1,000 mg total) by mouth every 8 (eight) hours as needed., Disp: 100 tablet, Rfl: 2   Calcium-Vitamin D 600-200 MG-UNIT tablet, Take 1 tablet by mouth 2 (two) times daily. , Disp: , Rfl:    Cholecalciferol (VITAMIN D3) 1000 units CAPS, Take by mouth., Disp: , Rfl:    Cod Liver Oil 1000  MG CAPS, Take by mouth., Disp: , Rfl:    Coenzyme Q10 (COQ10) 200 MG CAPS, Take 200 mg by mouth daily., Disp: , Rfl:    fluticasone furoate-vilanterol (BREO ELLIPTA) 200-25 MCG/ACT AEPB, Inhale 1 puff into the lungs daily., Disp: , Rfl:    Garlic 1000 MG CAPS, Take 1,000 mg by mouth. , Disp: , Rfl:    L-Lysine  500 MG TABS, Take by mouth., Disp: , Rfl:    levocetirizine (XYZAL) 5 MG tablet, TAKE 1 TABLET BY MOUTH ONCE DAILY IN THE EVENING, Disp: 90 tablet, Rfl: 0   magnesium oxide (MAG-OX) 400 MG tablet, Take 500 mg by mouth daily. , Disp: , Rfl:    mometasone (NASONEX) 50 MCG/ACT nasal spray, Place 2 sprays into the nose daily., Disp: 1 each, Rfl: 12   montelukast (SINGULAIR) 10 MG tablet, TAKE 1 TABLET BY MOUTH AT BEDTIME, Disp: 90 tablet, Rfl: 0   Nutritional Supplements (JOINT FORMULA PO), Take 2 tablets by mouth daily. glucosamine, Disp: , Rfl:    olmesartan-hydrochlorothiazide (BENICAR HCT) 40-25 MG tablet, Take 1 tablet by mouth daily., Disp: 90 tablet, Rfl: 1   omeprazole (PRILOSEC) 40 MG capsule, Take 1 capsule (40 mg total) by mouth daily., Disp: 90 capsule, Rfl: 1   pravastatin (PRAVACHOL) 20 MG tablet, Take 1 tablet (20 mg total) by mouth daily., Disp: 90 tablet, Rfl: 1   rOPINIRole (REQUIP) 1 MG tablet, Take 1 tablet (1 mg total) by mouth at bedtime., Disp: 90 tablet, Rfl: 1  Allergies  Allergen Reactions   Ace Inhibitors Cough   Aspirin Nausea Only    Abdominal pain   Crestor [Rosuvastatin]     Muscle pain      ROS  Constitutional: Negative for fever or weight change.  Respiratory: Negative for cough and shortness of breath.   Cardiovascular: Negative for chest pain or palpitations.  Gastrointestinal: Negative for abdominal pain, no bowel changes.  Musculoskeletal: Negative for gait problem or joint swelling.  Skin: Negative for rash.  Neurological: Negative for dizziness or headache.  No other specific complaints in a complete review of systems (except as listed in HPI above).   Objective  Vitals:   07/15/23 0841  BP: 118/74  Pulse: 74  Resp: 16  SpO2: 97%  Weight: 142 lb (64.4 kg)  Height: 5' (1.524 m)    Body mass index is 27.73 kg/m.  Physical Exam  Constitutional: Patient appears well-developed and well-nourished. No distress.  HENT: Head: Normocephalic  and atraumatic. Ears: B TMs ok, no erythema or effusion; Nose: Nose normal. Mouth/Throat: Oropharynx is clear and moist. No oropharyngeal exudate.  Eyes: Conjunctivae and EOM are normal. Pupils are equal, round, and reactive to light. No scleral icterus.  Neck: Normal range of motion. Neck supple. No JVD present. No thyromegaly present.  Cardiovascular: Normal rate, regular rhythm and normal heart sounds.  No murmur heard. No BLE edema. Pulmonary/Chest: Effort normal and breath sounds normal. No respiratory distress. Abdominal: Soft. Bowel sounds are normal, no distension. There is no tenderness. no masses Breast: no lumps or masses, no nipple discharge or rashes FEMALE GENITALIA:  Not done  RECTAL: not done  Musculoskeletal: Normal range of motion, no joint effusions. No gross deformities Neurological: he is alert and oriented to person, place, and time. No cranial nerve deficit. Coordination, balance, strength, speech and gait are normal.  Skin: Skin is warm and dry. No rash noted. No erythema.  Psychiatric: Patient has a normal mood and affect. behavior is normal. Judgment and  thought content normal.   Fall Risk:    07/15/2023    8:40 AM 05/12/2023    9:38 AM 02/03/2023    9:13 AM 01/24/2023   11:21 AM 01/03/2023   11:13 AM  Fall Risk   Falls in the past year? 0 0 0 0 0  Number falls in past yr: 0 0 0 0 0  Injury with Fall? 0 0 0 0 0  Risk for fall due to : No Fall Risks No Fall Risks No Fall Risks No Fall Risks No Fall Risks  Follow up Falls prevention discussed Falls prevention discussed Falls prevention discussed Falls prevention discussed;Education provided;Falls evaluation completed Education provided;Falls prevention discussed     Functional Status Survey: Is the patient deaf or have difficulty hearing?: No Does the patient have difficulty seeing, even when wearing glasses/contacts?: No Does the patient have difficulty concentrating, remembering, or making decisions?: No Does  the patient have difficulty walking or climbing stairs?: No Does the patient have difficulty dressing or bathing?: No Does the patient have difficulty doing errands alone such as visiting a doctor's office or shopping?: No   Assessment & Plan  1. Well adult exam   Discussed genetic testing due to family history and she will think about it   -USPSTF grade A and B recommendations reviewed with patient; age-appropriate recommendations, preventive care, screening tests, etc discussed and encouraged; healthy living encouraged; see AVS for patient education given to patient -Discussed importance of 150 minutes of physical activity weekly, eat two servings of fish weekly, eat one serving of tree nuts ( cashews, pistachios, pecans, almonds.Marland Kitchen) every other day, eat 6 servings of fruit/vegetables daily and drink plenty of water and avoid sweet beverages.   -Reviewed Health Maintenance: Yes.

## 2023-07-15 ENCOUNTER — Encounter: Payer: Self-pay | Admitting: Family Medicine

## 2023-07-15 ENCOUNTER — Ambulatory Visit (INDEPENDENT_AMBULATORY_CARE_PROVIDER_SITE_OTHER): Payer: Medicare HMO | Admitting: Family Medicine

## 2023-07-15 VITALS — BP 118/74 | HR 74 | Resp 16 | Ht 60.0 in | Wt 142.0 lb

## 2023-07-15 DIAGNOSIS — Z0000 Encounter for general adult medical examination without abnormal findings: Secondary | ICD-10-CM

## 2023-07-15 DIAGNOSIS — Z23 Encounter for immunization: Secondary | ICD-10-CM

## 2023-07-15 NOTE — Patient Instructions (Addendum)
Get Tdap at local pharmacy

## 2023-08-05 ENCOUNTER — Ambulatory Visit
Admission: RE | Admit: 2023-08-05 | Discharge: 2023-08-05 | Disposition: A | Discharge status: Home / Self Care | Payer: Medicare HMO | Source: Ambulatory Visit | Attending: Family Medicine | Admitting: Family Medicine

## 2023-08-05 DIAGNOSIS — R928 Other abnormal and inconclusive findings on diagnostic imaging of breast: Secondary | ICD-10-CM | POA: Diagnosis not present

## 2023-08-05 DIAGNOSIS — R92333 Mammographic heterogeneous density, bilateral breasts: Secondary | ICD-10-CM | POA: Diagnosis not present

## 2023-08-23 ENCOUNTER — Other Ambulatory Visit: Payer: Self-pay | Admitting: Family Medicine

## 2023-08-23 DIAGNOSIS — J069 Acute upper respiratory infection, unspecified: Secondary | ICD-10-CM

## 2023-08-23 DIAGNOSIS — J45998 Other asthma: Secondary | ICD-10-CM

## 2023-08-23 DIAGNOSIS — J4540 Moderate persistent asthma, uncomplicated: Secondary | ICD-10-CM

## 2023-10-04 ENCOUNTER — Other Ambulatory Visit: Payer: Self-pay | Admitting: Family Medicine

## 2023-10-04 DIAGNOSIS — J4540 Moderate persistent asthma, uncomplicated: Secondary | ICD-10-CM

## 2023-10-04 DIAGNOSIS — J45998 Other asthma: Secondary | ICD-10-CM

## 2023-10-31 ENCOUNTER — Other Ambulatory Visit: Payer: Self-pay | Admitting: Family Medicine

## 2023-10-31 DIAGNOSIS — Z1231 Encounter for screening mammogram for malignant neoplasm of breast: Secondary | ICD-10-CM

## 2023-11-06 ENCOUNTER — Encounter: Payer: Self-pay | Admitting: Family Medicine

## 2023-11-06 ENCOUNTER — Ambulatory Visit: Payer: Medicare Other | Admitting: Family Medicine

## 2023-11-06 VITALS — BP 122/68 | HR 87 | Temp 98.0°F | Resp 16 | Ht 60.0 in | Wt 142.8 lb

## 2023-11-06 DIAGNOSIS — J101 Influenza due to other identified influenza virus with other respiratory manifestations: Secondary | ICD-10-CM | POA: Diagnosis not present

## 2023-11-06 LAB — POC COVID19/FLU A&B COMBO
Covid Antigen, POC: NEGATIVE
Influenza A Antigen, POC: POSITIVE — AB
Influenza B Antigen, POC: NEGATIVE

## 2023-11-06 MED ORDER — BENZONATATE 100 MG PO CAPS
100.0000 mg | ORAL_CAPSULE | Freq: Three times a day (TID) | ORAL | 0 refills | Status: DC | PRN
Start: 2023-11-06 — End: 2024-01-06

## 2023-11-06 NOTE — Progress Notes (Signed)
Name: Ruth [name]   MRN: [id number]    DOB: 01-14-1946   Date:11/06/2023       Progress Note  Subjective  Chief Complaint  Chief Complaint  Patient presents with   Cough    Sx since Monday   Headache   Chills   Generalized Body Aches    Discussed the use of AI scribe software for clinical note transcription with the patient, who gave verbal consent to proceed.  History of Present Illness   Ruth [name] is a 78 year old female who presents with URI symptoms  Symptoms began four days ago, including myalgia, dizziness, rhinorrhea, nasal congestion, and chills. No fever is present, but chills are noted. Dizziness is described as lightheadedness and is currently her most bothersome symptom. She has not left the house since the onset of symptoms.  She experiences wheezing and coughing but no shortness of breath. She wants medication to manage her cough. She attempted to manage her symptoms with over-the-counter medication, specifically Coricidin, but ran out of it. She is trying to stay hydrated, although she notes her urine is medium yellow, indicating possible dehydration.  A fever blister appeared on Sunday, prior to the onset of flu symptoms, and is beginning to heal. She takes lysine tablets daily, lesion is healing now        Patient Active Problem List   Diagnosis Date Noted   Senile purpura (HCC) 04/29/2022   Peripheral polyneuropathy 04/29/2022   Asthma, well controlled 04/29/2022   Moderate persistent asthma with acute exacerbation 12/26/2021   B12 deficiency 12/09/2018   Dyslipidemia 11/08/2017   Vitamin D deficiency 10/31/2016   Osteopenia of left hip 10/31/2016   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   BPV (benign positional vertigo) 04/17/2016   RLS (restless legs syndrome) 04/17/2016   GERD (gastroesophageal reflux disease) 03/11/2016   History of hyperparathyroidism 03/11/2016   History of gastric ulcer 03/11/2016   Essential hypertension 04/24/2015    Primary osteoarthritis involving multiple joints 04/24/2015   Intermittent low back pain 04/24/2015   Family history of colonic polyps     Social History   Tobacco Use   Smoking status: Never   Smokeless tobacco: Never  Substance Use Topics   Alcohol use: Yes    Alcohol/week: 3.0 standard drinks of alcohol    Types: 3 Glasses of wine per week    Comment: wine     Current Outpatient Medications:    acetaminophen (TYLENOL) 500 MG tablet, Take 1-2 tablets (500-1,000 mg total) by mouth every 8 (eight) hours as needed., Disp: 100 tablet, Rfl: 2   Calcium-Vitamin D 600-200 MG-UNIT tablet, Take 1 tablet by mouth 2 (two) times daily. , Disp: , Rfl:    Cholecalciferol (VITAMIN D3) 1000 units CAPS, Take by mouth., Disp: , Rfl:    Cod Liver Oil 1000 MG CAPS, Take by mouth., Disp: , Rfl:    Coenzyme Q10 (COQ10) 200 MG CAPS, Take 200 mg by mouth daily., Disp: , Rfl:    fluticasone furoate-vilanterol (BREO ELLIPTA) 200-25 MCG/ACT AEPB, Inhale 1 puff into the lungs daily., Disp: , Rfl:    Garlic 1000 MG CAPS, Take 1,000 mg by mouth. , Disp: , Rfl:    L-Lysine 500 MG TABS, Take by mouth., Disp: , Rfl:    levocetirizine (XYZAL) 5 MG tablet, TAKE 1 TABLET BY MOUTH ONCE DAILY IN THE EVENING, Disp: 90 tablet, Rfl: 0   magnesium oxide (MAG-OX) 400 MG tablet, Take 500 mg by mouth daily. , Disp: ,  Rfl:    mometasone (NASONEX) 50 MCG/ACT nasal spray, Place 2 sprays into the nose daily., Disp: 1 each, Rfl: 12   montelukast (SINGULAIR) 10 MG tablet, TAKE 1 TABLET BY MOUTH AT BEDTIME, Disp: 90 tablet, Rfl: 0   Nutritional Supplements (JOINT FORMULA PO), Take 2 tablets by mouth daily. glucosamine, Disp: , Rfl:    olmesartan-hydrochlorothiazide (BENICAR HCT) 40-25 MG tablet, Take 1 tablet by mouth daily., Disp: 90 tablet, Rfl: 1   omeprazole (PRILOSEC) 40 MG capsule, Take 1 capsule (40 mg total) by mouth daily., Disp: 90 capsule, Rfl: 1   pravastatin (PRAVACHOL) 20 MG tablet, Take 1 tablet (20 mg total) by  mouth daily., Disp: 90 tablet, Rfl: 1   rOPINIRole (REQUIP) 1 MG tablet, Take 1 tablet (1 mg total) by mouth at bedtime., Disp: 90 tablet, Rfl: 1  Allergies  Allergen Reactions   Ace Inhibitors Cough   Aspirin Nausea Only    Abdominal pain   Crestor [Rosuvastatin]     Muscle pain     ROS  Ten systems reviewed and is negative except as mentioned in HPI    Objective  Vitals:   11/06/23 1348  BP: 122/68  Pulse: 87  Resp: 16  Temp: 98 F (36.7 C)  TempSrc: Oral  SpO2: 98%  Weight: 142 lb 12.8 oz (64.8 kg)  Height: 5' (1.524 m)    Body mass index is 27.89 kg/m.    Physical Exam  Constitutional: Patient appears well-developed and well-nourished.  No distress.  HEENT: head atraumatic, normocephalic, pupils equal and reactive to light, ears normal TM, neck supple, throat within normal limits Cardiovascular: Normal rate, regular rhythm and normal heart sounds.  No murmur heard. No BLE edema. Pulmonary/Chest: Effort normal and breath sounds normal. No respiratory distress. Abdominal: Soft.  There is no tenderness. Psychiatric: Patient has a normal mood and affect. behavior is normal. Judgment and thought content normal.   Recent Results (from the past 2160 hours)  POC Covid19/Flu A&B Antigen     Status: Abnormal   Collection Time: 11/06/23  1:57 PM  Result Value Ref Range   Influenza A Antigen, POC Positive (A) Negative   Influenza B Antigen, POC Negative Negative   Covid Antigen, POC Negative Negative     Assessment and Plan    Influenza A Day 4 of symptoms including dizziness, body aches, chills, and cough. No fever. Likely dehydrated contributing to dizziness. -Stay hydrated. -Take Tessalon Perles for cough, to be sent to [facility] pharmacy.  Herpes Labialis (Fever Blister) Healing stage, started prior to flu symptoms.  -Continue Valtrex as needed.   Public Health Patient attended church while likely contagious with Influenza A. -Advised to avoid church  this coming Sunday or wear a mask if attending.

## 2023-11-11 ENCOUNTER — Ambulatory Visit: Payer: Self-pay | Admitting: *Deleted

## 2023-11-11 ENCOUNTER — Telehealth: Payer: Medicare Other | Admitting: Family Medicine

## 2023-11-11 DIAGNOSIS — J4521 Mild intermittent asthma with (acute) exacerbation: Secondary | ICD-10-CM

## 2023-11-11 DIAGNOSIS — J101 Influenza due to other identified influenza virus with other respiratory manifestations: Secondary | ICD-10-CM

## 2023-11-11 MED ORDER — FLUTICASONE FUROATE-VILANTEROL 200-25 MCG/ACT IN AEPB
1.0000 | INHALATION_SPRAY | Freq: Every day | RESPIRATORY_TRACT | 1 refills | Status: AC
Start: 2023-11-11 — End: ?

## 2023-11-11 MED ORDER — ALBUTEROL SULFATE HFA 108 (90 BASE) MCG/ACT IN AERS
2.0000 | INHALATION_SPRAY | RESPIRATORY_TRACT | 1 refills | Status: AC | PRN
Start: 2023-11-11 — End: ?

## 2023-11-11 MED ORDER — PREDNISONE 20 MG PO TABS
40.0000 mg | ORAL_TABLET | Freq: Every day | ORAL | 0 refills | Status: AC
Start: 2023-11-11 — End: 2023-11-16

## 2023-11-11 NOTE — Progress Notes (Signed)
 Name: Ruth [name]   MRN: [id number]    DOB: 07-Oct-1945   Date:11/11/2023       Progress Note  Subjective:    Chief Complaint  Chief Complaint  Patient presents with   Wheezing    Dx w/flu    I connected with  Ruth [name]  on 11/11/23 at  8:40 AM EST by a video enabled telemedicine application and verified that I am speaking with the correct person using two identifiers.  I discussed the limitations of evaluation and management by telemedicine and the availability of in person appointments. The patient expressed understanding and agreed to proceed. Staff also discussed with the patient that there may be a patient responsible charge related to this service. Patient Location: home Provider Location: [facility] clinic office Additional Individuals present: Gelene Mink spouse   Wheezing  Associated symptoms include coughing. Pertinent negatives include no chest pain, chills, fever or shortness of breath.     Pt with flu A, she saw Dr. Carlynn Purl a few days ago, she presents due to continued productive cough and wheeze. She saw Dr. Eliezer Lofts 4+ days after sx onset, out of window for tamiflu, managed with cough meds/tessalon She has asthma but has not used her rescue inhaler and she has not been using her Breo maintenance inhaler daily like she is supposed to. She feels wheezy but not SOB.  She hasn't had fever for over a week. Cough is more productive, no pain with breathing, no DOE, no waking at night with chest tightness or SOb  She rarely uses her rescue inhaler and cannot remember the last time she needed steroids    Patient Active Problem List   Diagnosis Date Noted   Senile purpura (HCC) 04/29/2022   Peripheral polyneuropathy 04/29/2022   Asthma, well controlled 04/29/2022   Moderate persistent asthma with acute exacerbation 12/26/2021   B12 deficiency 12/09/2018   Dyslipidemia 11/08/2017   Vitamin D deficiency 10/31/2016   Osteopenia of left hip 10/31/2016   Chronic allergic  bronchitis 06/18/2016   BPV (benign positional vertigo) 04/17/2016   RLS (restless legs syndrome) 04/17/2016   GERD (gastroesophageal reflux disease) 03/11/2016   History of hyperparathyroidism 03/11/2016   History of gastric ulcer 03/11/2016   Essential hypertension 04/24/2015   Primary osteoarthritis involving multiple joints 04/24/2015   Intermittent low back pain 04/24/2015   Family history of colonic polyps     Social History   Tobacco Use   Smoking status: Never   Smokeless tobacco: Never  Substance Use Topics   Alcohol use: Yes    Alcohol/week: 3.0 standard drinks of alcohol    Types: 3 Glasses of wine per week    Comment: wine     Current Outpatient Medications:    acetaminophen (TYLENOL) 500 MG tablet, Take 1-2 tablets (500-1,000 mg total) by mouth every 8 (eight) hours as needed., Disp: 100 tablet, Rfl: 2   benzonatate (TESSALON) 100 MG capsule, Take 1-2 capsules (100-200 mg total) by mouth 3 (three) times daily as needed for cough., Disp: 40 capsule, Rfl: 0   Calcium-Vitamin D 600-200 MG-UNIT tablet, Take 1 tablet by mouth 2 (two) times daily. , Disp: , Rfl:    Cholecalciferol (VITAMIN D3) 1000 units CAPS, Take by mouth., Disp: , Rfl:    Cod Liver Oil 1000 MG CAPS, Take by mouth., Disp: , Rfl:    Coenzyme Q10 (COQ10) 200 MG CAPS, Take 200 mg by mouth daily., Disp: , Rfl:    fluticasone furoate-vilanterol (BREO ELLIPTA) 200-25 MCG/ACT  AEPB, Inhale 1 puff into the lungs daily., Disp: , Rfl:    Garlic 1000 MG CAPS, Take 1,000 mg by mouth. , Disp: , Rfl:    L-Lysine 500 MG TABS, Take by mouth., Disp: , Rfl:    levocetirizine (XYZAL) 5 MG tablet, TAKE 1 TABLET BY MOUTH ONCE DAILY IN THE EVENING, Disp: 90 tablet, Rfl: 0   magnesium oxide (MAG-OX) 400 MG tablet, Take 500 mg by mouth daily. , Disp: , Rfl:    mometasone (NASONEX) 50 MCG/ACT nasal spray, Place 2 sprays into the nose daily., Disp: 1 each, Rfl: 12   montelukast (SINGULAIR) 10 MG tablet, TAKE 1 TABLET BY MOUTH AT  BEDTIME, Disp: 90 tablet, Rfl: 0   Nutritional Supplements (JOINT FORMULA PO), Take 2 tablets by mouth daily. glucosamine, Disp: , Rfl:    olmesartan-hydrochlorothiazide (BENICAR HCT) 40-25 MG tablet, Take 1 tablet by mouth daily., Disp: 90 tablet, Rfl: 1   omeprazole (PRILOSEC) 40 MG capsule, Take 1 capsule (40 mg total) by mouth daily., Disp: 90 capsule, Rfl: 1   pravastatin (PRAVACHOL) 20 MG tablet, Take 1 tablet (20 mg total) by mouth daily., Disp: 90 tablet, Rfl: 1   rOPINIRole (REQUIP) 1 MG tablet, Take 1 tablet (1 mg total) by mouth at bedtime., Disp: 90 tablet, Rfl: 1  Allergies  Allergen Reactions   Ace Inhibitors Cough   Aspirin Nausea Only    Abdominal pain   Crestor [Rosuvastatin]     Muscle pain     I personally reviewed active problem list, medication list, allergies, family history, social history, health maintenance, notes from last encounter, lab results, imaging with the patient/caregiver today.   Review of Systems  Constitutional: Negative.  Negative for activity change, appetite change, chills, diaphoresis, fatigue and fever.  HENT: Negative.    Eyes: Negative.   Respiratory:  Positive for cough and wheezing. Negative for chest tightness and shortness of breath.   Cardiovascular: Negative.  Negative for chest pain.  Gastrointestinal: Negative.   Endocrine: Negative.   Genitourinary: Negative.   Musculoskeletal: Negative.   Skin: Negative.   Allergic/Immunologic: Negative.   Neurological: Negative.   Hematological: Negative.   Psychiatric/Behavioral: Negative.    All other systems reviewed and are negative.     Objective:   Virtual encounter, vitals limited, only able to obtain the following There were no vitals filed for this visit. There is no height or weight on file to calculate BMI. Nursing Note and Vital Signs reviewed.  Physical Exam Vitals and nursing note reviewed.  Constitutional:      General: She is not in acute distress.    Appearance:  Normal appearance. She is obese. She is not ill-appearing, toxic-appearing or diaphoretic.  Pulmonary:     Effort: Pulmonary effort is normal. No tachypnea, accessory muscle usage, respiratory distress or retractions.     Comments: No audible wheeze or stridor Speaking in full and complete sentences Neurological:     Mental Status: She is alert.     PE limited by virtual encounter  No results found for this or any previous visit (from the past 72 hours).  Assessment and Plan:     ICD-10-CM   1. Intermittent asthma with acute exacerbation, unspecified asthma severity  J45.21 fluticasone furoate-vilanterol (BREO ELLIPTA) 200-25 MCG/ACT AEPB    predniSONE (DELTASONE) 20 MG tablet    albuterol (VENTOLIN HFA) 108 (90 Base) MCG/ACT inhaler    2. Influenza A  J10.1    most sx improving except cough and wheeze, continue  cough meds, add mucinex and inhalers    Pt having some wheeze and not currently using any of her inhalers, otherwise sx are the same to better Productive cough, no fever/CP/SOB/body aches/fatigue/diaphoresis Start maintenance and rescue inhaler and if wheeze isn't improve in 2-3 days start steroid.  Explained that her normal inhaler refills will need to come from pulm or PCP - I sent them in since she was out of them. She does have f/up with pulmonology next month.  She was instructed to come into the office with any worsening sx so she can be examined again in person.  -Red flags and when to present for emergency care or RTC including fever >101.65F, chest pain, shortness of breath, new/worsening/un-resolving symptoms, reviewed with patient at time of visit. Follow up and care instructions discussed and provided in AVS. - I discussed the assessment and treatment plan with the patient. The patient was provided an opportunity to ask questions and all were answered. The patient agreed with the plan and demonstrated an understanding of the instructions.  I provided 25+ minutes  of non-face-to-face time during this encounter.  Danelle Berry, PA-C 11/11/23 8:43 AM

## 2023-11-11 NOTE — Telephone Encounter (Signed)
  Chief Complaint: recent diagnosis flu- 9/12, patient reports wheezing Symptoms: cough, wheezing with breathing- hx asthma with no inhaler Frequency: OV 9/12- flu diagnosed- wheezing has been constant during symptoms- patient is not sure she was wheezing at OV Pertinent Negatives: Patient denies SOB- patient reports she is breathing fine- some labored breathing with cough Disposition: [] ED /[] Urgent Care (no appt availability in office) / [x] Appointment(In office/virtual)/ []  [facility]/ [] Home Care/ [] Refused Recommended Disposition /[] [facility]/ []  Follow-up with PCP Additional Notes: Patient has been scheduled for VV with provider- she may need additional Rx

## 2023-11-11 NOTE — Telephone Encounter (Signed)
 Reason for Disposition  [1] MILD difficulty breathing (e.g., minimal/no SOB at rest, SOB with walking, pulse <100) AND [2] NEW-onset or WORSE than normal  Answer Assessment - Initial Assessment Questions 1. RESPIRATORY STATUS: "Describe your breathing?" (e.g., wheezing, shortness of breath, unable to speak, severe coughing)      Audible wheezing when breathing but no difficulty wheezing 2. ONSET: "When did this breathing problem begin?"      Currently diagnosed with flu 3. PATTERN "Does the difficult breathing come and go, or has it been constant since it started?"      Patient states she has had wheezing- when breathing 4. SEVERITY: "How bad is your breathing?" (e.g., mild, moderate, severe)    - MILD: No SOB at rest, mild SOB with walking, speaks normally in sentences, can lie down, no retractions, pulse < 100.    - MODERATE: SOB at rest, SOB with minimal exertion and prefers to sit, cannot lie down flat, speaks in phrases, mild retractions, audible wheezing, pulse 100-120.    - SEVERE: Very SOB at rest, speaks in single words, struggling to breathe, sitting hunched forward, retractions, pulse > 120      mild 5. RECURRENT SYMPTOM: "Have you had difficulty breathing before?" If Yes, ask: "When was the last time?" and "What happened that time?"      Not this bad- pain is better- body aches are gone- breathing in hears wheezing 6. CARDIAC HISTORY: "Do you have any history of heart disease?" (e.g., heart attack, angina, bypass surgery, angioplasty)      hypertension 7. LUNG HISTORY: "Do you have any history of lung disease?"  (e.g., pulmonary embolus, asthma, emphysema)     asthma 8. CAUSE: "What do you think is causing the breathing problem?"      flu 9. OTHER SYMPTOMS: "Do you have any other symptoms? (e.g., dizziness, runny nose, cough, chest pain, fever)     cough  Protocols used: Breathing Difficulty-A-AH

## 2023-11-17 ENCOUNTER — Encounter: Payer: Self-pay | Admitting: Family Medicine

## 2023-11-17 ENCOUNTER — Ambulatory Visit: Payer: Self-pay | Admitting: Family Medicine

## 2023-11-17 NOTE — Telephone Encounter (Signed)
 Chief Complaint: Productive cough Symptoms: Runny nose, sneezing Frequency: Ongoing x3 weeks Pertinent Negatives: Patient denies hemoptysis, difficultly breathing, fever Disposition: [] ED /[] Urgent Care (no appt availability in office) / [x] Appointment(In office/virtual)/ []  [facility]/ [] Home Care/ [] Refused Recommended Disposition /[] [facility]/ []  Follow-up with PCP Additional Notes: Pt tested positive for the flu 2 weeks ago. Pt states her productive cough will not go away. Pt took prednisone but it did not help. Appt scheduled for pt on 2/26. This RN educated pt on home care, new-worsening symptoms, when to call back/seek emergent care. Pt verbalized understanding and agrees to plan.   Copied from CRM [id number]. Topic: Clinical - Red Word Triage >> Nov 17, 2023  9:32 AM Shon Hale wrote: Red Word that prompted transfer to Nurse Triage: Patient has persistent cough, patient completed medication and no relief. Reason for Disposition  Cough has been present for > 3 weeks  Answer Assessment - Initial Assessment Questions 1. ONSET: "When did the cough begin?"      About 3 weeks ago 2. SEVERITY: "How bad is the cough today?"      Not as dry as it was, more productive 3. SPUTUM: "Describe the color of your sputum" (none, dry cough; clear, Frost, yellow, green)     Thick and yellow in beginning and now clear 4. HEMOPTYSIS: "Are you coughing up any blood?" If so ask: "How much?" (flecks, streaks, tablespoons, etc.)     Denies 5. DIFFICULTY BREATHING: "Are you having difficulty breathing?" If Yes, ask: "How bad is it?" (e.g., mild, moderate, severe)    - MILD: No SOB at rest, mild SOB with walking, speaks normally in sentences, can lie down, no retractions, pulse < 100.    - MODERATE: SOB at rest, SOB with minimal exertion and prefers to sit, cannot lie down flat, speaks in phrases, mild retractions, audible wheezing, pulse 100-120.    - SEVERE: Very SOB at rest, speaks in  single words, struggling to breathe, sitting hunched forward, retractions, pulse > 120      Denies 6. FEVER: "Do you have a fever?" If Yes, ask: "What is your temperature, how was it measured, and when did it start?"     Denies 7. OTHER SYMPTOMS: "Do you have any other symptoms?" (e.g., runny nose, wheezing, chest pain) Sneezing, runny nose  Protocols used: Cough - Acute Productive-A-AH

## 2023-11-18 ENCOUNTER — Other Ambulatory Visit: Payer: Self-pay | Admitting: Family Medicine

## 2023-11-18 MED ORDER — HYDROCOD POLI-CHLORPHE POLI ER 10-8 MG/5ML PO SUER: 5.0000 mL | Freq: Two times a day (BID) | ORAL | 0 refills | Status: DC | PRN

## 2023-11-18 NOTE — Telephone Encounter (Unsigned)
 Copied from CRM [id number]. Topic: Appointments - Scheduling Inquiry for Clinic >> Nov 18, 2023  4:17 PM Turkey B wrote: Reason for CRM: pt called in is going to pickup tussin tonight, so she wants to wait a few days to see how it works, so she is cancelling appt for tomorrow

## 2023-11-19 ENCOUNTER — Ambulatory Visit: Payer: Medicare Other | Admitting: Family Medicine

## 2023-12-10 ENCOUNTER — Ambulatory Visit: Payer: Self-pay | Admitting: Family Medicine

## 2023-12-26 ENCOUNTER — Other Ambulatory Visit: Payer: Self-pay | Admitting: Family Medicine

## 2023-12-26 DIAGNOSIS — K219 Gastro-esophageal reflux disease without esophagitis: Secondary | ICD-10-CM

## 2023-12-31 ENCOUNTER — Other Ambulatory Visit: Payer: Self-pay | Admitting: Family Medicine

## 2023-12-31 ENCOUNTER — Ambulatory Visit
Admission: RE | Admit: 2023-12-31 | Discharge: 2023-12-31 | Disposition: A | Discharge status: Home / Self Care | Payer: Medicare HMO | Source: Ambulatory Visit | Attending: Family Medicine | Admitting: Family Medicine

## 2023-12-31 DIAGNOSIS — Z1231 Encounter for screening mammogram for malignant neoplasm of breast: Secondary | ICD-10-CM | POA: Diagnosis present

## 2023-12-31 DIAGNOSIS — K219 Gastro-esophageal reflux disease without esophagitis: Secondary | ICD-10-CM

## 2024-01-05 ENCOUNTER — Telehealth: Payer: Self-pay | Admitting: Family Medicine

## 2024-01-05 ENCOUNTER — Other Ambulatory Visit: Payer: Self-pay | Admitting: Family Medicine

## 2024-01-05 DIAGNOSIS — K219 Gastro-esophageal reflux disease without esophagitis: Secondary | ICD-10-CM

## 2024-01-05 NOTE — Telephone Encounter (Signed)
 Patient needs an appt for regular follow up

## 2024-01-05 NOTE — Telephone Encounter (Signed)
Appt sch'd for tomorrow   

## 2024-01-05 NOTE — Telephone Encounter (Signed)
 Refill from [facility]  omeprazole (PRILOSEC) 40 MG capsule

## 2024-01-05 NOTE — Telephone Encounter (Signed)
 Copied from CRM [id number]. Topic: Clinical - Medication Refill >> Jan 05, 2024  9:29 AM Loreda Rodriguez T wrote: Most Recent Primary Care Visit:  Provider: Adeline Hone  Department: [facility]  Visit Type: MYCHART VIDEO VISIT  Date: 11/11/2023  Medication: omeprazole (PRILOSEC) 40 MG capsule  Has the patient contacted their pharmacy? No  Is this the correct pharmacy for this prescription? Yes If no, delete pharmacy and type the correct one.  This is the patient's preferred pharmacy:  [facility] [address] [place] - [address] [place] [address] Phone: [phone] Fax: [phone]   Has the prescription been filled recently? Yes  Is the patient out of the medication? Yes  Has the patient been seen for an appointment in the last year OR does the patient have an upcoming appointment? Yes  Can we respond through MyChart? No  Agent: Please be advised that Rx refills may take up to 3 business days. We ask that you follow-up with your pharmacy.  Patient took last pill on Saturday night

## 2024-01-06 ENCOUNTER — Ambulatory Visit (INDEPENDENT_AMBULATORY_CARE_PROVIDER_SITE_OTHER): Admitting: Internal Medicine

## 2024-01-06 ENCOUNTER — Other Ambulatory Visit: Payer: Self-pay

## 2024-01-06 ENCOUNTER — Encounter: Payer: Self-pay | Admitting: Internal Medicine

## 2024-01-06 VITALS — BP 122/74 | HR 87 | Temp 98.1°F | Resp 16 | Ht 60.0 in | Wt 138.4 lb

## 2024-01-06 DIAGNOSIS — E785 Hyperlipidemia, unspecified: Secondary | ICD-10-CM | POA: Diagnosis not present

## 2024-01-06 DIAGNOSIS — K219 Gastro-esophageal reflux disease without esophagitis: Secondary | ICD-10-CM | POA: Diagnosis not present

## 2024-01-06 DIAGNOSIS — J45998 Other asthma: Secondary | ICD-10-CM

## 2024-01-06 DIAGNOSIS — I10 Essential (primary) hypertension: Secondary | ICD-10-CM | POA: Diagnosis not present

## 2024-01-06 DIAGNOSIS — J4540 Moderate persistent asthma, uncomplicated: Secondary | ICD-10-CM

## 2024-01-06 MED ORDER — PRAVASTATIN SODIUM 20 MG PO TABS: 20.0000 mg | ORAL_TABLET | Freq: Every day | ORAL | 1 refills | Status: DC

## 2024-01-06 MED ORDER — OMEPRAZOLE 40 MG PO CPDR: 40.0000 mg | DELAYED_RELEASE_CAPSULE | Freq: Every day | ORAL | 1 refills | Status: DC

## 2024-01-06 MED ORDER — OLMESARTAN MEDOXOMIL-HCTZ 40-25 MG PO TABS: 1.0000 | ORAL_TABLET | Freq: Every day | ORAL | 1 refills | Status: DC

## 2024-01-06 NOTE — Progress Notes (Signed)
 Established Patient Office Visit  Subjective   Patient ID: Ruth [name], female    DOB: July 08, 1946  Age: 78 y.o. MRN: [id number]  Chief Complaint  Patient presents with   Medical Management of Chronic Issues    Medication refills 6 month recheck    HPI  Patient here for follow up on chronic medical conditions and medication refills.   Hypertension: -Medications: Olmesartan-hydrochlorothiazide 40-25 mg -Patient is compliant with above medications and reports no side effects. -Denies any SOB, CP, vision changes, LE edema or symptoms of hypotension  HLD: -Medications: Pravastatin 20 mg  -Patient is compliant with above medications and reports no side effects.  -Last lipid panel: Lipid Panel     Component Value Date/Time   CHOL 116 10/30/2022 1126   CHOL 165 06/06/2015 0834   TRIG 173 (H) 10/30/2022 1126   HDL 48 (L) 10/30/2022 1126   HDL 48 06/06/2015 0834   CHOLHDL 2.4 10/30/2022 1126   VLDL 34 (H) 10/31/2016 0926   LDLCALC 43 10/30/2022 1126   LABVLDL 24 06/06/2015 0834    Asthma/Allergies:  -Asthma status: stable -Current Treatments: Breo and Albuterol PRN as well as Xyzal and Singulair  -Satisfied with current treatment?: yes -Limitation of activity: no -Current upper respiratory symptoms: no  GERD: -Currently on Prilosec 40 mg which she does well on, needs refills   Health Maintenance: -Blood work due  Patient Active Problem List   Diagnosis Date Noted   Senile purpura (HCC) 04/29/2022   Peripheral polyneuropathy 04/29/2022   Asthma, well controlled 04/29/2022   Moderate persistent asthma with acute exacerbation 12/26/2021   B12 deficiency 12/09/2018   Dyslipidemia 11/08/2017   Vitamin D deficiency 10/31/2016   Osteopenia of left hip 10/31/2016   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   BPV (benign positional vertigo) 04/17/2016   RLS (restless legs syndrome) 04/17/2016   GERD (gastroesophageal reflux disease) 03/11/2016   History of  hyperparathyroidism 03/11/2016   History of gastric ulcer 03/11/2016   Essential hypertension 04/24/2015   Primary osteoarthritis involving multiple joints 04/24/2015   Intermittent low back pain 04/24/2015   Family history of colonic polyps    Past Medical History:  Diagnosis Date   Allergy    mycins; cause diarrhea   Arthritis    since 2007/ back   Asthma    allergy related/ worse in spring   Breast screening, unspecified    Family history of colonic polyps    GERD (gastroesophageal reflux disease)    Headache    every morning   History of cystitis 1987   Hypertension    since 2005   Neuropathy    feet and legs/  nerve damage from a fall   Osteoarthritis    Parathyroid disorder (HCC) 2008   surgery   Restless leg syndrome    Scoliosis    Sleep apnea    CPAP used for a year and not now/ after official test told she didnt have it / test negative   Trigger finger of right hand    Ulcer 1966   Vertigo    hx of   Past Surgical History:  Procedure Laterality Date   BREAST BIOPSY Left 01/22/2023   stereo bx, distortion w/ calcs, RIBBON clip-path pending   BREAST BIOPSY Left 01/22/2023   MM LT BREAST BX W LOC DEV 1ST LESION IMAGE BX SPEC STEREO GUIDE 01/22/2023 [facility]   CARPAL TUNNEL RELEASE Bilateral 2000   CATARACT EXTRACTION W/PHACO Left 04/18/2020   Procedure: CATARACT EXTRACTION PHACO AND  INTRAOCULAR LENS PLACEMENT (IOC) LEFT 4.78  00:32.8;  Surgeon: Clair Crews, MD;  Location: [facility];  Service: Ophthalmology;  Laterality: Left;   CATARACT EXTRACTION W/PHACO Right 05/09/2020   Procedure: CATARACT EXTRACTION PHACO AND INTRAOCULAR LENS PLACEMENT (IOC) RIGHT 3.41 00:21.8;  Surgeon: Clair Crews, MD;  Location: [facility];  Service: Ophthalmology;  Laterality: Right;   COLONOSCOPY  06/17/2012   A few two mm ulcers were found in the sigmoid colon, no bleeding present,bx taken-path reporet on the few tiny ulcers ,non specific    COLONOSCOPY WITH PROPOFOL N/A 06/11/2017   Procedure: COLONOSCOPY WITH PROPOFOL;  Surgeon: Jerlean Mood, MD;  Location: [facility];  Service: Endoscopy;  Laterality: N/A;   COLONOSCOPY WITH PROPOFOL N/A 08/08/2022   Procedure: COLONOSCOPY WITH PROPOFOL;  Surgeon: Selena Daily, MD;  Location: [facility];  Service: Endoscopy;  Laterality: N/A;   parathyroid surgery  2008   TEMPOROMANDIBULAR JOINT ARTHROPLASTY Left 1984   1989,1990-bilateral   TUBAL LIGATION     Social History   Tobacco Use   Smoking status: Never   Smokeless tobacco: Never  Vaping Use   Vaping status: Never Used  Substance Use Topics   Alcohol use: Yes    Alcohol/week: 3.0 standard drinks of alcohol    Types: 3 Glasses of wine per week    Comment: wine   Drug use: No   Social History   Socioeconomic History   Marital status: Married    Spouse name: Kerryann Allaire    Number of children: 1   Years of education: Not on file   Highest education level: Some college, no degree  Occupational History   Not on file  Tobacco Use   Smoking status: Never   Smokeless tobacco: Never  Vaping Use   Vaping status: Never Used  Substance and Sexual Activity   Alcohol use: Yes    Alcohol/week: 3.0 standard drinks of alcohol    Types: 3 Glasses of wine per week    Comment: wine   Drug use: No   Sexual activity: Yes    Partners: Male    Birth control/protection: Post-menopausal  Other Topics Concern   Not on file  Social History Narrative   Married, retired, involved in church and has one grown son    Social Drivers of Corporate investment banker Strain: Low Risk  (01/03/2023)   Overall Financial Resource Strain (CARDIA)    Difficulty of Paying Living Expenses: Not hard at all  Food Insecurity: No Food Insecurity (07/15/2023)   Hunger Vital Sign    Worried About Running Out of Food in the Last Year: Never true    Ran Out of Food in the Last Year: Never true  Transportation Needs: No  Transportation Needs (01/03/2023)   PRAPARE - Administrator, Civil Service (Medical): No    Lack of Transportation (Non-Medical): No  Physical Activity: Inactive (01/03/2023)   Exercise Vital Sign    Days of Exercise per Week: 0 days    Minutes of Exercise per Session: 0 min  Stress: No Stress Concern Present (01/03/2023)   [employer] of Occupational Health - Occupational Stress Questionnaire    Feeling of Stress : Not at all  Social Connections: Socially Integrated (01/03/2023)   Social Connection and Isolation Panel [NHANES]    Frequency of Communication with Friends and Family: More than three times a week    Frequency of Social Gatherings with Friends and Family: Three times a week  Attends Religious Services: More than 4 times per year    Active Member of Clubs or Organizations: Yes    Attends Banker Meetings: More than 4 times per year    Marital Status: Married  Catering manager Violence: Not At Risk (07/15/2023)   Humiliation, Afraid, Rape, and Kick questionnaire    Fear of Current or Ex-Partner: No    Emotionally Abused: No    Physically Abused: No    Sexually Abused: No   Family Status  Relation Name Status   Mother  Deceased   Father  Deceased   Sister  Alive   MGM  (Not Specified)   Cousin  (Not Specified)  No partnership data on file   Family History  Adopted: Yes  Problem Relation Age of Onset   Kidney disease Mother    Ovarian cancer Mother    Hypertension Father    Stroke Father    Lupus Sister    Breast cancer Maternal Grandmother    Breast cancer Cousin        pat cousins   Allergies  Allergen Reactions   Ace Inhibitors Cough   Aspirin Nausea Only    Abdominal pain   Crestor [Rosuvastatin]     Muscle pain       Review of Systems  All other systems reviewed and are negative.     Objective:     BP 122/74 (Cuff Size: Large)   Pulse 87   Temp 98.1 F (36.7 C) (Oral)   Resp 16   Ht 5' (1.524 m)   Wt 138  lb 6.4 oz (62.8 kg)   SpO2 95%   BMI 27.03 kg/m  BP Readings from Last 3 Encounters:  01/06/24 122/74  11/06/23 122/68  07/15/23 118/74   Wt Readings from Last 3 Encounters:  01/06/24 138 lb 6.4 oz (62.8 kg)  11/06/23 142 lb 12.8 oz (64.8 kg)  07/15/23 142 lb (64.4 kg)      Physical Exam Constitutional:      Appearance: Normal appearance.  HENT:     Head: Normocephalic and atraumatic.     Mouth/Throat:     Mouth: Mucous membranes are moist.     Pharynx: Oropharynx is clear.  Eyes:     Extraocular Movements: Extraocular movements intact.     Conjunctiva/sclera: Conjunctivae normal.     Pupils: Pupils are equal, round, and reactive to light.  Cardiovascular:     Rate and Rhythm: Normal rate and regular rhythm.  Pulmonary:     Effort: Pulmonary effort is normal.     Breath sounds: Normal breath sounds.  Skin:    General: Skin is warm and dry.  Neurological:     General: No focal deficit present.     Mental Status: She is alert. Mental status is at baseline.  Psychiatric:        Mood and Affect: Mood normal.        Behavior: Behavior normal.      No results found for any visits on 01/06/24.  Last CBC Lab Results  Component Value Date   WBC 8.5 01/24/2023   HGB 13.2 01/24/2023   HCT 39.2 01/24/2023   MCV 92.7 01/24/2023   MCH 31.2 01/24/2023   RDW 11.6 01/24/2023   PLT 287 01/24/2023   Last metabolic panel Lab Results  Component Value Date   GLUCOSE 79 01/24/2023   NA 141 01/24/2023   K 3.7 01/24/2023   CL 103 01/24/2023   CO2 28 01/24/2023  BUN 17 01/24/2023   CREATININE 0.95 01/24/2023   EGFR 62 01/24/2023   CALCIUM 10.2 01/24/2023   PROT 7.0 01/24/2023   ALBUMIN 4.5 10/31/2016   LABGLOB 2.7 06/06/2015   AGRATIO 1.6 06/06/2015   BILITOT 0.4 01/24/2023   ALKPHOS 48 10/31/2016   AST 19 01/24/2023   ALT 15 01/24/2023   Last lipids Lab Results  Component Value Date   CHOL 116 10/30/2022   HDL 48 (L) 10/30/2022   LDLCALC 43 10/30/2022    TRIG 173 (H) 10/30/2022   CHOLHDL 2.4 10/30/2022   Last hemoglobin A1c Lab Results  Component Value Date   HGBA1C 5.3 10/31/2016   Last thyroid functions Lab Results  Component Value Date   TSH 1.28 01/24/2023   Last vitamin D Lab Results  Component Value Date   VD25OH 40 10/30/2022   Last vitamin B12 and Folate Lab Results  Component Value Date   VITAMINB12 689 10/30/2022   FOLATE 12.1 10/30/2022      The ASCVD Risk score (Arnett DK, et al., 2019) failed to calculate for the following reasons:   The valid total cholesterol range is 130 to 320 mg/dL    Assessment & Plan:   Assessment & Plan Acid Reflux Symptoms managed by dietary modifications. - Refilled Prilosec prescription.  Hypertension Requires ongoing management. Blood pressure stable here today and labs due.  - Refilled blood pressure medication.  Hyperlipidemia On medication for cholesterol control, recheck labs today. - Refilled cholesterol medication.  Asthma:  Stable, doing well on inhalers and allergy medication.  General Health Maintenance Due for yearly laboratory tests. - Ordered yearly laboratory tests: kidney function, liver function, electrolytes, complete blood count, cholesterol levels.  - CBC w/Diff/Platelet - COMPLETE METABOLIC PANEL WITHOUT GFR - olmesartan-hydrochlorothiazide (BENICAR HCT) 40-25 MG tablet; Take 1 tablet by mouth daily.  Dispense: 90 tablet; Refill: 1 - Lipid Profile - pravastatin (PRAVACHOL) 20 MG tablet; Take 1 tablet (20 mg total) by mouth daily.  Dispense: 90 tablet; Refill: 1 - omeprazole (PRILOSEC) 40 MG capsule; Take 1 capsule (40 mg total) by mouth daily.  Dispense: 90 capsule; Refill: 1   Return in about 6 months (around 07/07/2024).    Rockney Cid, DO

## 2024-01-07 ENCOUNTER — Encounter: Payer: Self-pay | Admitting: Internal Medicine

## 2024-01-07 LAB — CBC WITH DIFFERENTIAL/PLATELET
Absolute Lymphocytes: 2093 {cells}/uL (ref 850–3900)
Absolute Monocytes: 651 {cells}/uL (ref 200–950)
Basophils Absolute: 21 {cells}/uL (ref 0–200)
Basophils Relative: 0.3 %
Eosinophils Absolute: 49 {cells}/uL (ref 15–500)
Eosinophils Relative: 0.7 %
HCT: 39.4 % (ref 35.0–45.0)
Hemoglobin: 13.1 g/dL (ref 11.7–15.5)
MCH: 31.3 pg (ref 27.0–33.0)
MCHC: 33.2 g/dL (ref 32.0–36.0)
MCV: 94.3 fL (ref 80.0–100.0)
MPV: 10.2 fL (ref 7.5–12.5)
Monocytes Relative: 9.3 %
Neutro Abs: 4186 {cells}/uL (ref 1500–7800)
Neutrophils Relative %: 59.8 %
Platelets: 300 10*3/uL (ref 140–400)
RBC: 4.18 10*6/uL (ref 3.80–5.10)
RDW: 11.9 % (ref 11.0–15.0)
Total Lymphocyte: 29.9 %
WBC: 7 10*3/uL (ref 3.8–10.8)

## 2024-01-07 LAB — COMPLETE METABOLIC PANEL WITHOUT GFR
AG Ratio: 1.8 (calc) (ref 1.0–2.5)
ALT: 23 U/L (ref 6–29)
AST: 27 U/L (ref 10–35)
Albumin: 4.5 g/dL (ref 3.6–5.1)
Alkaline phosphatase (APISO): 44 U/L (ref 37–153)
BUN: 18 mg/dL (ref 7–25)
CO2: 32 mmol/L (ref 20–32)
Calcium: 10.4 mg/dL (ref 8.6–10.4)
Chloride: 102 mmol/L (ref 98–110)
Creat: 0.77 mg/dL (ref 0.60–1.00)
Globulin: 2.5 g/dL (ref 1.9–3.7)
Glucose, Bld: 81 mg/dL (ref 65–99)
Potassium: 3.7 mmol/L (ref 3.5–5.3)
Sodium: 141 mmol/L (ref 135–146)
Total Bilirubin: 0.6 mg/dL (ref 0.2–1.2)
Total Protein: 7 g/dL (ref 6.1–8.1)

## 2024-01-07 LAB — LIPID PANEL
Cholesterol: 173 mg/dL (ref <200)
HDL: 45 mg/dL — ABNORMAL LOW (ref >=50)
LDL Cholesterol (Calc): 101 mg/dL — ABNORMAL HIGH
Non-HDL Cholesterol (Calc): 128 mg/dL (ref <130)
Total CHOL/HDL Ratio: 3.8 (calc) (ref <5.0)
Triglycerides: 178 mg/dL — ABNORMAL HIGH (ref <150)

## 2024-01-08 ENCOUNTER — Ambulatory Visit (INDEPENDENT_AMBULATORY_CARE_PROVIDER_SITE_OTHER): Payer: Medicare HMO

## 2024-01-08 DIAGNOSIS — Z0000 Encounter for general adult medical examination without abnormal findings: Secondary | ICD-10-CM | POA: Diagnosis not present

## 2024-01-08 NOTE — Progress Notes (Signed)
 Subjective:   Ruth [name] is a 78 y.o. who presents for a Medicare Wellness preventive visit.  Visit Complete: Virtual I connected with  Ruth [name] on 01/08/24 by a audio enabled telemedicine application and verified that I am speaking with the correct person using two identifiers.  Patient Location: Home  Provider Location: Office/Clinic  I discussed the limitations of evaluation and management by telemedicine. The patient expressed understanding and agreed to proceed.  Vital Signs: Because this visit was a virtual/telehealth visit, some criteria may be missing or patient reported. Any vitals not documented were not able to be obtained and vitals that have been documented are patient reported.  VideoDeclined- This patient declined Librarian, academic. Therefore the visit was completed with audio only.  Persons Participating in Visit: Patient.  AWV Questionnaire: No: Patient Medicare AWV questionnaire was not completed prior to this visit.  Cardiac Risk Factors include: advanced age (>90men, >2 women);dyslipidemia;hypertension;sedentary lifestyle     Objective:    There were no vitals filed for this visit. There is no height or weight on file to calculate BMI.     01/08/2024   11:07 AM 03/07/2023    4:19 AM 01/03/2023   11:26 AM 08/08/2022    6:39 AM 12/04/2021    9:40 AM 11/30/2020    9:14 AM 05/09/2020    9:14 AM  Advanced Directives  Does Patient Have a Medical Advance Directive? No No No No No No No  Does patient want to make changes to medical advance directive?     No - Patient declined    Would patient like information on creating a medical advance directive? No - Patient declined No - Patient declined  No - Patient declined  Yes (MAU/Ambulatory/Procedural Areas - Information given) No - Patient declined    Current Medications (verified) Outpatient Encounter Medications as of 01/08/2024  Medication Sig   acetaminophen (TYLENOL) 500  MG tablet Take 1-2 tablets (500-1,000 mg total) by mouth every 8 (eight) hours as needed.   albuterol (VENTOLIN HFA) 108 (90 Base) MCG/ACT inhaler Inhale 2 puffs into the lungs every 4 (four) hours as needed for wheezing or shortness of breath.   Calcium-Vitamin D 600-200 MG-UNIT tablet Take 1 tablet by mouth 2 (two) times daily.    Cholecalciferol (VITAMIN D3) 1000 units CAPS Take by mouth.   Cod Liver Oil 1000 MG CAPS Take by mouth.   Coenzyme Q10 (COQ10) 200 MG CAPS Take 200 mg by mouth daily.   fluticasone furoate-vilanterol (BREO ELLIPTA) 200-25 MCG/ACT AEPB Inhale 1 puff into the lungs daily.   Garlic 1000 MG CAPS Take 1,000 mg by mouth.    L-Lysine 500 MG TABS Take by mouth.   levocetirizine (XYZAL) 5 MG tablet TAKE 1 TABLET BY MOUTH ONCE DAILY IN THE EVENING   magnesium oxide (MAG-OX) 400 MG tablet Take 500 mg by mouth daily.    mometasone (NASONEX) 50 MCG/ACT nasal spray Place 2 sprays into the nose daily.   montelukast (SINGULAIR) 10 MG tablet TAKE 1 TABLET BY MOUTH AT BEDTIME   Nutritional Supplements (JOINT FORMULA PO) Take 2 tablets by mouth daily. glucosamine   olmesartan-hydrochlorothiazide (BENICAR HCT) 40-25 MG tablet Take 1 tablet by mouth daily.   omeprazole (PRILOSEC) 40 MG capsule Take 1 capsule (40 mg total) by mouth daily.   pravastatin (PRAVACHOL) 20 MG tablet Take 1 tablet (20 mg total) by mouth daily.   No facility-administered encounter medications on file as of 01/08/2024.  Allergies (verified) Ace inhibitors, Aspirin, and Crestor [rosuvastatin]   History: Past Medical History:  Diagnosis Date   Allergy    mycins; cause diarrhea   Arthritis    since 2007/ back   Asthma    allergy related/ worse in spring   Breast screening, unspecified    Family history of colonic polyps    GERD (gastroesophageal reflux disease)    Headache    every morning   History of cystitis 1987   Hypertension    since 2005   Neuropathy    feet and legs/  nerve damage from a  fall   Osteoarthritis    Parathyroid disorder (HCC) 2008   surgery   Restless leg syndrome    Scoliosis    Sleep apnea    CPAP used for a year and not now/ after official test told she didnt have it / test negative   Trigger finger of right hand    Ulcer 1966   Vertigo    hx of   Past Surgical History:  Procedure Laterality Date   BREAST BIOPSY Left 01/22/2023   stereo bx, distortion w/ calcs, RIBBON clip-path pending   BREAST BIOPSY Left 01/22/2023   MM LT BREAST BX W LOC DEV 1ST LESION IMAGE BX SPEC STEREO GUIDE 01/22/2023 [facility]   CARPAL TUNNEL RELEASE Bilateral 2000   CATARACT EXTRACTION W/PHACO Left 04/18/2020   Procedure: CATARACT EXTRACTION PHACO AND INTRAOCULAR LENS PLACEMENT (IOC) LEFT 4.78  00:32.8;  Surgeon: Galen Manila, MD;  Location: [facility];  Service: Ophthalmology;  Laterality: Left;   CATARACT EXTRACTION W/PHACO Right 05/09/2020   Procedure: CATARACT EXTRACTION PHACO AND INTRAOCULAR LENS PLACEMENT (IOC) RIGHT 3.41 00:21.8;  Surgeon: Galen Manila, MD;  Location: [facility];  Service: Ophthalmology;  Laterality: Right;   COLONOSCOPY  06/17/2012   A few two mm ulcers were found in the sigmoid colon, no bleeding present,bx taken-path reporet on the few tiny ulcers ,non specific   COLONOSCOPY WITH PROPOFOL N/A 06/11/2017   Procedure: COLONOSCOPY WITH PROPOFOL;  Surgeon: Kieth Brightly, MD;  Location: [facility];  Service: Endoscopy;  Laterality: N/A;   COLONOSCOPY WITH PROPOFOL N/A 08/08/2022   Procedure: COLONOSCOPY WITH PROPOFOL;  Surgeon: Toney Reil, MD;  Location: [facility];  Service: Endoscopy;  Laterality: N/A;   parathyroid surgery  2008   TEMPOROMANDIBULAR JOINT ARTHROPLASTY Left 1984   1989,1990-bilateral   TUBAL LIGATION     Family History  Adopted: Yes  Problem Relation Age of Onset   Kidney disease Mother    Ovarian cancer Mother    Hypertension Father    Stroke Father    Lupus Sister     Breast cancer Maternal Grandmother    Breast cancer Cousin        pat cousins   Social History   Socioeconomic History   Marital status: Married    Spouse name: Ruth [name]    Number of children: 1   Years of education: Not on file   Highest education level: Some college, no degree  Occupational History   Not on file  Tobacco Use   Smoking status: Never   Smokeless tobacco: Never  Vaping Use   Vaping status: Never Used  Substance and Sexual Activity   Alcohol use: Yes    Alcohol/week: 3.0 standard drinks of alcohol    Types: 3 Glasses of wine per week    Comment: wine   Drug use: No   Sexual activity: Yes  Partners: Male    Birth control/protection: Post-menopausal  Other Topics Concern   Not on file  Social History Narrative   Married, retired, involved in church and has one grown son    Social Drivers of Corporate investment banker Strain: Low Risk  (01/08/2024)   Overall Financial Resource Strain (CARDIA)    Difficulty of Paying Living Expenses: Not hard at all  Food Insecurity: No Food Insecurity (01/08/2024)   Hunger Vital Sign    Worried About Running Out of Food in the Last Year: Never true    Ran Out of Food in the Last Year: Never true  Transportation Needs: No Transportation Needs (01/08/2024)   PRAPARE - Administrator, Civil Service (Medical): No    Lack of Transportation (Non-Medical): No  Physical Activity: Inactive (01/08/2024)   Exercise Vital Sign    Days of Exercise per Week: 0 days    Minutes of Exercise per Session: 0 min  Stress: No Stress Concern Present (01/08/2024)   [employer] of Occupational Health - Occupational Stress Questionnaire    Feeling of Stress : Not at all  Social Connections: Socially Integrated (01/08/2024)   Social Connection and Isolation Panel [NHANES]    Frequency of Communication with Friends and Family: More than three times a week    Frequency of Social Gatherings with Friends and Family:  Three times a week    Attends Religious Services: More than 4 times per year    Active Member of Clubs or Organizations: Yes    Attends Engineer, structural: More than 4 times per year    Marital Status: Married    Tobacco Counseling Counseling given: Not Answered    Clinical Intake:  Pre-visit preparation completed: Yes  Pain : No/denies pain     BMI - recorded: 27 Nutritional Status: BMI 25 -29 Overweight Nutritional Risks: None Diabetes: No  Lab Results  Component Value Date   HGBA1C 5.3 10/31/2016     How often do you need to have someone help you when you read instructions, pamphlets, or other written materials from your doctor or pharmacy?: 1 - Never  Interpreter Needed?: No  Information entered by :: Kennedy Bucker, LPN   Activities of Daily Living    01/08/2024   11:08 AM 01/06/2024    8:35 AM  In your present state of health, do you have any difficulty performing the following activities:  Hearing? 0 0  Vision? 0 0  Difficulty concentrating or making decisions? 0 0  Walking or climbing stairs? 0 0  Dressing or bathing? 0 0  Doing errands, shopping? 0 0  Preparing Food and eating ? N   Using the Toilet? N   In the past six months, have you accidently leaked urine? N   Do you have problems with loss of bowel control? N   Managing your Medications? N   Managing your Finances? N   Housekeeping or managing your Housekeeping? N     Patient Care Team: Alba Cory, MD as PCP - General (Family Medicine) Sidney Ace, MD as Referring Physician (Allergy) Lonell Face, MD as Consulting Physician (Neurology) Galen Manila, MD as Referring Physician (Ophthalmology)  Indicate any recent Medical Services you may have received from other than [facility] providers in the past year (date may be approximate).     Assessment:   This is a routine wellness examination for Ruth [name].  Hearing/Vision screen Hearing Screening - Comments:: NO  AIDS Vision Screening - Comments:: READERS,  HAD CATARACT SGY, HAS IMPLANTS- DR.PORFILIO   Goals Addressed             This Visit's Progress    DIET - EAT MORE FRUITS AND VEGETABLES         Depression Screen     01/08/2024   11:05 AM 01/06/2024    8:35 AM 11/06/2023    1:41 PM 07/15/2023    8:41 AM 05/12/2023    9:39 AM 02/03/2023    9:13 AM 01/24/2023   11:21 AM  PHQ 2/9 Scores  PHQ - 2 Score 0 0 0 0 0 0 0  PHQ- 9 Score 0  0 0 0 0 0    Fall Risk     01/08/2024   11:08 AM 01/06/2024    8:35 AM 11/06/2023    1:41 PM 07/15/2023    8:40 AM 05/12/2023    9:38 AM  Fall Risk   Falls in the past year? 0 0 0 0 0  Number falls in past yr: 0 0 0 0 0  Injury with Fall? 0 0 0 0 0  Risk for fall due to : No Fall Risks No Fall Risks No Fall Risks No Fall Risks No Fall Risks  Follow up Falls prevention discussed;Falls evaluation completed Falls evaluation completed Falls prevention discussed;Education provided;Falls evaluation completed Falls prevention discussed Falls prevention discussed    MEDICARE RISK AT HOME:  Medicare Risk at Home Any stairs in or around the home?: Yes If so, are there any without handrails?: No Home free of loose throw rugs in walkways, pet beds, electrical cords, etc?: Yes Adequate lighting in your home to reduce risk of falls?: Yes Life alert?: No Use of a cane, walker or w/c?: No Grab bars in the bathroom?: No Shower chair or bench in shower?: Yes Elevated toilet seat or a handicapped toilet?: Yes  TIMED UP AND GO:  Was the test performed?  No  Cognitive Function: 6CIT completed        01/08/2024   11:11 AM 01/03/2023   11:27 AM 11/25/2019   11:46 AM 11/19/2018   12:42 PM  6CIT Screen  What Year? 0 points 0 points 0 points 0 points  What month? 0 points 0 points 0 points 0 points  What time? 0 points 0 points 0 points 0 points  Count back from 20 0 points 0 points 0 points 0 points  Months in reverse 0 points 0 points 2 points 0 points  Repeat  phrase 4 points 0 points 2 points 0 points  Total Score 4 points 0 points 4 points 0 points    Immunizations Immunization History  Administered Date(s) Administered   Fluad Quad(high Dose 65+) 05/25/2020, 06/04/2021   Influenza, High Dose Seasonal PF 06/05/2015, 06/18/2016, 06/17/2017, 06/09/2018, 06/16/2019, 06/11/2023   Influenza-Unspecified 06/05/2015, 06/12/2022   Moderna Covid-19 Fall Seasonal Vaccine 13yrs & older 07/13/2022   Moderna Sars-Covid-2 Vaccination 10/19/2019, 11/16/2019, 07/26/2020, 01/06/2021   Pneumococcal Conjugate-13 12/09/2013   Pneumococcal Polysaccharide-23 04/13/2012   Rsv, Bivalent, Protein Subunit Rsvpref,pf Verdis Frederickson) 06/12/2022   Tdap 04/13/2012   Unspecified SARS-COV-2 Vaccination 06/11/2023   Zoster Recombinant(Shingrix) 06/04/2021, 11/29/2021   Zoster, Live 07/24/2013    Screening Tests Health Maintenance  Topic Date Due   DTaP/Tdap/Td (2 - Td or Tdap) 04/13/2022   COVID-19 Vaccine (7 - Moderna risk 2024-25 season) 01/22/2024 (Originally 12/09/2023)   INFLUENZA VACCINE  04/23/2024   MAMMOGRAM  12/30/2024   Medicare Annual Wellness (AWV)  01/07/2025   DEXA SCAN  06/06/2027   Pneumonia Vaccine 76+ Years old  Completed   Hepatitis C Screening  Completed   Zoster Vaccines- Shingrix  Completed   HPV VACCINES  Aged Out   Meningococcal B Vaccine  Aged Out   Colonoscopy  Discontinued    Health Maintenance  Health Maintenance Due  Topic Date Due   DTaP/Tdap/Td (2 - Td or Tdap) 04/13/2022   Health Maintenance Items Addressed: UP TO DATE ON SHOTS EXCEPT TDAP; UP TO DATE ON MAMMOGRAM  Additional Screening:  Vision Screening: Recommended annual ophthalmology exams for early detection of glaucoma and other disorders of the eye.  Dental Screening: Recommended annual dental exams for proper oral hygiene  Community Resource Referral / Chronic Care Management: CRR required this visit?  No   CCM required this visit?  No     Plan:     I have  personally reviewed and noted the following in the patient's chart:   Medical and social history Use of alcohol, tobacco or illicit drugs  Current medications and supplements including opioid prescriptions. Patient is not currently taking opioid prescriptions. Functional ability and status Nutritional status Physical activity Advanced directives List of other physicians Hospitalizations, surgeries, and ER visits in previous 12 months Vitals Screenings to include cognitive, depression, and falls Referrals and appointments  In addition, I have reviewed and discussed with patient certain preventive protocols, quality metrics, and best practice recommendations. A written personalized care plan for preventive services as well as general preventive health recommendations were provided to patient.     Pinky Bright, LPN   1/61/0960   After Visit Summary: (MyChart) Due to this being a telephonic visit, the after visit summary with patients personalized plan was offered to patient via MyChart   Notes: Nothing significant to report at this time.

## 2024-01-08 NOTE — Patient Instructions (Addendum)
 Ms. Gose , Thank you for taking time to come for your Medicare Wellness Visit. I appreciate your ongoing commitment to your health goals. Please review the following plan we discussed and let me know if I can assist you in the future.   Referrals/Orders/Follow-Ups/Clinician Recommendations: NONE  This is a list of the screening recommended for you and due dates:  Health Maintenance  Topic Date Due   DTaP/Tdap/Td vaccine (2 - Td or Tdap) 04/13/2022   COVID-19 Vaccine (7 - Moderna risk 2024-25 season) 01/22/2024*   Flu Shot  04/23/2024   Mammogram  12/30/2024   Medicare Annual Wellness Visit  01/07/2025   DEXA scan (bone density measurement)  06/06/2027   Pneumonia Vaccine  Completed   Hepatitis C Screening  Completed   Zoster (Shingles) Vaccine  Completed   HPV Vaccine  Aged Out   Meningitis B Vaccine  Aged Out   Colon Cancer Screening  Discontinued  *Topic was postponed. The date shown is not the original due date.    Advanced directives: (ACP Link)Information on Advanced Care Planning can be found at [place]  Secretary of [facility] Advance Health Care Directives Advance Health Care Directives. [email or web address]   Next Medicare Annual Wellness Visit scheduled for next year: Yes   01/13/25 @ 10:50 AM BY PHONE

## 2024-03-29 ENCOUNTER — Other Ambulatory Visit: Payer: Self-pay | Admitting: Family Medicine

## 2024-03-29 DIAGNOSIS — E785 Hyperlipidemia, unspecified: Secondary | ICD-10-CM

## 2024-05-20 ENCOUNTER — Ambulatory Visit: Payer: Self-pay

## 2024-05-20 NOTE — Telephone Encounter (Signed)
 FYI Only or Action Required?: FYI only for provider.  Patient was last seen in primary care on 01/06/2024 by Bernardo Fend, DO.  Called Nurse Triage reporting Shoulder Pain.  Symptoms began several months ago.  Symptoms are: gradually worsening.  Triage Disposition: See PCP When Office is Open (Within 3 Days)  Patient/caregiver understands and will follow disposition?: Yes       Copied from CRM #[id number]. Topic: Clinical - Red Word Triage >> May 20, 2024  1:29 PM Emylou G wrote: [facility] that prompted transfer to Nurse Triage: having extreme pain in right shoulder        Reason for Disposition  [1] MODERATE pain (e.g., interferes with normal activities) AND [2] present > 3 days  Answer Assessment - Initial Assessment Questions 1. ONSET: When did the pain start?     Ongoing problem since a fall at the end of last year  2. LOCATION: Where is the pain located?     Left shoulder like on my collarbone  3. PAIN: How bad is the pain? (Scale 1-10; or mild, moderate, severe)     5/10.  4. WORK OR EXERCISE: Has there been any recent work or exercise that involved this part of the body?     No 5. CAUSE: What do you think is causing the shoulder pain?     Previous fall  6. OTHER SYMPTOMS: Do you have any other symptoms? (e.g., neck pain, swelling, rash, fever, numbness, weakness)     Right shoulder pain for 1.5 weeks  Protocols used: Shoulder Pain-A-AH

## 2024-05-21 ENCOUNTER — Ambulatory Visit: Payer: Medicare (Managed Care) | Admitting: Family Medicine

## 2024-06-01 ENCOUNTER — Ambulatory Visit: Payer: Self-pay

## 2024-06-01 NOTE — Telephone Encounter (Signed)
 2nd attempt to contact patient-no answer-voicemail left to call back to Nurse triage. Will place in call backs.
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
## 2024-06-01 NOTE — Telephone Encounter (Signed)
 Called pt stated if concenred she will need to make sooner appt.
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
## 2024-06-07 ENCOUNTER — Other Ambulatory Visit: Payer: Self-pay | Admitting: Family Medicine

## 2024-06-07 DIAGNOSIS — I10 Essential (primary) hypertension: Secondary | ICD-10-CM

## 2024-06-07 DIAGNOSIS — K219 Gastro-esophageal reflux disease without esophagitis: Secondary | ICD-10-CM

## 2024-06-07 DIAGNOSIS — E785 Hyperlipidemia, unspecified: Secondary | ICD-10-CM

## 2024-06-07 NOTE — Telephone Encounter (Signed)
 Copied from CRM #[id number]. Topic: Clinical - Medication Refill >> Jun 07, 2024  1:05 PM Delon T wrote: Medication: pravastatin  (PRAVACHOL ) 20 MG tablet meloxicam  (MOBIC ) tablet 7.5 mg  olmesartan -hydrochlorothiazide  (BENICAR  HCT) 40-25 MG tablet omeprazole  (PRILOSEC) 40 MG capsule  Has the patient contacted their pharmacy? Yes (Agent: If no, request that the patient contact the pharmacy for the refill. If patient does not wish to contact the pharmacy document the reason why and proceed with request.) (Agent: If yes, when and what did the pharmacy advise?)  This is the patient's preferred pharmacy:  [facility] [phone] [address], [place] [address]  Is this the correct pharmacy for this prescription? Yes If no, delete pharmacy and type the correct one.   Has the prescription been filled recently? Yes  Is the patient out of the medication? Yes  Has the patient been seen for an appointment in the last year OR does the patient have an upcoming appointment? Yes  Can we respond through MyChart? Yes  Agent: Please be advised that Rx refills may take up to 3 business days. We ask that you follow-up with your pharmacy.

## 2024-06-24 ENCOUNTER — Telehealth: Payer: Self-pay | Admitting: Family Medicine

## 2024-06-24 ENCOUNTER — Ambulatory Visit: Payer: Self-pay

## 2024-06-24 ENCOUNTER — Other Ambulatory Visit: Payer: Self-pay | Admitting: Family Medicine

## 2024-06-24 ENCOUNTER — Ambulatory Visit (INDEPENDENT_AMBULATORY_CARE_PROVIDER_SITE_OTHER): Payer: Medicare (Managed Care)

## 2024-06-24 VITALS — BP 122/70 | HR 69 | Ht 60.0 in | Wt 126.0 lb

## 2024-06-24 DIAGNOSIS — I10 Essential (primary) hypertension: Secondary | ICD-10-CM

## 2024-06-24 DIAGNOSIS — E785 Hyperlipidemia, unspecified: Secondary | ICD-10-CM

## 2024-06-24 DIAGNOSIS — R21 Rash and other nonspecific skin eruption: Secondary | ICD-10-CM | POA: Insufficient documentation

## 2024-06-24 DIAGNOSIS — K219 Gastro-esophageal reflux disease without esophagitis: Secondary | ICD-10-CM

## 2024-06-24 MED ORDER — TRIAMCINOLONE ACETONIDE 0.1 % EX CREA: 1.0000 | TOPICAL_CREAM | Freq: Two times a day (BID) | CUTANEOUS | 0 refills | Status: AC

## 2024-06-24 NOTE — Telephone Encounter (Unsigned)
 Copied from CRM [id number]. Topic: Clinical - Medication Refill >> Jun 24, 2024 11:27 AM Kendralyn S wrote: Medication: omeprazole  (PRILOSEC) 40 MG capsule pravastatin  (PRAVACHOL ) 20 MG tablet olmesartan -hydrochlorothiazide  (BENICAR  HCT) 40-25 MG tablet   Has the patient contacted their pharmacy? Yes (Agent: If no, request that the patient contact the pharmacy for the refill. If patient does not wish to contact the pharmacy document the reason why and proceed with request.) (Agent: If yes, when and what did the pharmacy advise?)  This is the patient's preferred pharmacy:   [facility] - [place], [place] - [address] [place] [address] Phone: [phone] Fax: [phone]  Is this the correct pharmacy for this prescription? Yes If no, delete pharmacy and type the correct one.   Has the prescription been filled recently? No  Is the patient out of the medication? Yes pravastatin , No omeprazole   Has the patient been seen for an appointment in the last year OR does the patient have an upcoming appointment? Yes  Can we respond through MyChart? Yes  Agent: Please be advised that Rx refills may take up to 3 business days. We ask that you follow-up with your pharmacy.

## 2024-06-24 NOTE — Telephone Encounter (Addendum)
  Patient was already triaged.              Reason for Triage: rash/blister on pinky toe and knees, turning red, fingers peeling. Elbows itching        This encounter was created in error - please disregard.

## 2024-06-24 NOTE — Telephone Encounter (Signed)
 Pt is going to [facility] today but she does already have an appt sch'd with Dr Glenard for later this month

## 2024-06-24 NOTE — Telephone Encounter (Signed)
 FYI Only or Action Required?: FYI only for provider.  Patient was last seen in primary care on 01/06/2024 by Bernardo Fend, DO.  Called Nurse Triage reporting Rash.  Symptoms began about a month ago.  Interventions attempted: OTC medications: cortisone.  Symptoms are: gradually improving.  Triage Disposition: See PCP When Office is Open (Within 3 Days)  Patient/caregiver understands and will follow disposition?: Yes       Copied from CRM [id number]. Topic: Clinical - Red Word Triage >> Jun 24, 2024 11:56 AM Zane F wrote: [facility] that prompted transfer to Nurse Triage:   Patient started using the TENS therapy after the neuropathy attack in her feet ( so uncomfortable that she cannot touch her feet) When she put her hands in the water  3-4 hours later her fingers began to peel. Patient was on 5 and now is on level 1 due to advice from her coach ( unaware of the clinician's name)  Cleared up after a week.  Then her right knee started itching (patient stated that she has lyme disease and her knees looked like that or poison ivy) and then spread to the left but the right knee looks like it was burned and the skin is discolored.   This week her pinky toe started peeling and the patient treated it with   Patient also using a light therapy machine (ultra-violet light)  Side of her knees, back and bottoms of her feet.   Patient also using a vibration plate  Patient also drinks a green juice is apart of the treatment.  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 Have you taken anything to assist with the matter?: Yes   If so, what did you take?: cortisone cream    Wanted to let you know I will be transferring you to further discuss  your concern. Please be advised the nurse can assist with scheduling. Reason for Disposition  Mild widespread rash  (Exception: Heat rash lasting 3 days or less.)    Greater than 3 days  Answer Assessment - Initial Assessment Questions Pt has neuropathy, was told a TENS unit would help get her nerves back working. She has been using 3 different  devices: one is light that she puts on her legs feet, back and it is like a light therapy. She then uses a Tens unit and vibration plate. To use the tens, she puts her feet in water  for 15 minutes. She states it is helping because she started at a level 5 and couldn't feel anything and now she's at a level one. One day the neuropathy was so that she did it extra and put her hands in the water  to help massage her feet. Shortly after that the skin on her hands and fingers started to peel. She's states she called her coach (someone who helps with these machines) and they told her not to the tens that night and to cut back to a level three. She states then she started to get a rash on her knees. They blistered and were itchy and felt like they were on fire. That rash started about 2.5 weeks ago. She states it does look better in the mornings, then gets worse as the  day goes on. She's currently doing Tens unit water  bath in the mornings. She states the rash on her knee looks like when she had lyme disease. She states that she got it on her elbows, knees, pinky toe and everything has cleared up except the knees. She states she can't wear pants because if anything touches it, it makes the itching return.  She states she has tried cortisone cream and that does help. She states initially she didn't believe she had lyme disease but [facility] did a biopsy and confirmed it. She states that she doesn't have Lyme disease anymore because she prayed it away.   Cortsone cream Left foot is almost stage4   1. APPEARANCE of RASH: What does the rash look like? (e.g., blisters, dry  flaky skin, red spots, redness, sores)     Looks like when lyme disease would flare up 2. SIZE: How big are the spots? (e.g., tip of pen, eraser, coin; inches, centimeters)     Whole knee cap even down on the side.  3. LOCATION: Where is the rash located?     Right knee  4. COLOR: What color is the rash? (Note: It is difficult to assess rash color in people with darker-colored skin. When this situation occurs, simply ask the caller to describe what they see.)     red 5. ONSET: When did the rash begin?     2 weeks ago 6. FEVER: Do you have a fever? If Yes, ask: What is your temperature, how was it measured, and when did it start?     No fever 7. ITCHING: Does the rash itch? If Yes, ask: How bad is the itch? (Scale 1-10; or mild, moderate, severe)     Yes, if something rubs on it 8. CAUSE: What do you think is causing the rash?     Thinks it might be tens unit. Said it's not from lyme disease because she doesn't have it  9. MEDICINE FACTORS: Have you started any new medicines within the last 2 weeks? (e.g., antibiotics)      In the last month, tens and things for neuropathy 10. OTHER SYMPTOMS: Do you have any other symptoms? (e.g., dizziness, headache, sore throat, joint pain)  Protocols used: Rash or Redness - [facility]

## 2024-06-24 NOTE — Progress Notes (Unsigned)
      Acute Patient Visit  Physician: Jaylen Knope A Kani Jobson, MD  Patient: Ruth [name] MRN: [id number] DOB: 01/21/1946 PCP: Sowles, Krichna, MD     Subjective:   Chief Complaint  Patient presents with   Rash     Patient states Rash on her right knee and Its very itchy.     HPI: The patient is a 78 y.o. female who presents today for:   Discussed the use of AI scribe software for clinical note transcription with the patient, who gave verbal consent to proceed.  History of Present Illness    Patient seen for evaluation of rash.  Onset of rash beginning 1 week ago on her right knee.  No new soaps lotions or other applications.  She does have a history of neuropathy which is currently being treated with a TENS unit.  No other new factors.  Rash is pruritic.  Rash is not spreading without systemic symptoms.     ROS:   As noted in the HPI    ASSESMENT/PLAN:  No diagnosis found.  No orders of the defined types were placed in this encounter.   Assessment and Plan      1. Irritant dermatitis -precipitating cause unknown.  Topical 0.1% triamcinolone.  Follow-up with primary care provider if no resolution.    OBJECTIVE: There were no vitals filed for this visit.  There is no height or weight on file to calculate BMI.   Physical Exam Vitals reviewed.  Constitutional:      Appearance: Normal appearance. Well-developed with normal weight.  Cardiovascular:     Rate and Rhythm: Normal rate and regular rhythm. Normal heart sounds. Normal peripheral pulses Pulmonary:     Normal breath sounds with normal effort Skin:    General: Skin is warm and dry - papular rash/ few small papules with hyperpigmentation  Neurological:     General: No focal deficit present.  Psychiatric:        Mood and Affect: Mood, behavior and cognition normal       Allergies Patient is allergic to ace inhibitors, aspirin, and crestor  [rosuvastatin ].  Past Medical History Patient  has a past  medical history of Allergy, Arthritis, Asthma, Breast screening, unspecified, Family history of colonic polyps, GERD (gastroesophageal reflux disease), Headache, History of cystitis (1987), Hypertension, Neuropathy, Osteoarthritis, Parathyroid  disorder (2008), Restless leg syndrome, Scoliosis, Sleep apnea, Trigger finger of right hand, Ulcer (1966), and Vertigo.  Surgical History Patient  has a past surgical history that includes Carpal tunnel release (Bilateral, 2000); Temporomandibular joint arthroplasty (Left, 1984); parathyroid  surgery (2008); Colonoscopy (06/17/2012); Tubal ligation; Colonoscopy with propofol  (N/A, 06/11/2017); Cataract extraction w/PHACO (Left, 04/18/2020); Cataract extraction w/PHACO (Right, 05/09/2020); Colonoscopy with propofol  (N/A, 08/08/2022); Breast biopsy (Left, 01/22/2023); and Breast biopsy (Left, 01/22/2023).  Family History Pateint's family history includes Breast cancer in her cousin and maternal grandmother; Hypertension in her father; Kidney disease in her mother; Lupus in her sister; Ovarian cancer in her mother; Stroke in her father. She was adopted.  Social History Patient  reports that she has never smoked. She has never used smokeless tobacco. She reports current alcohol use of about 3.0 standard drinks of alcohol per week. She reports that she does not use drugs.    06/24/2024

## 2024-06-24 NOTE — Telephone Encounter (Signed)
 Copied from CRM [id number]. Topic: Clinical - Medication Refill >> Jun 24, 2024 11:27 AM Kendralyn S wrote: Medication: omeprazole  (PRILOSEC) 40 MG capsule pravastatin  (PRAVACHOL ) 20 MG tablet olmesartan -hydrochlorothiazide  (BENICAR  HCT) 40-25 MG tablet   Has the patient contacted their pharmacy? Yes (Agent: If no, request that the patient contact the pharmacy for the refill. If patient does not wish to contact the pharmacy document the reason why and proceed with request.) (Agent: If yes, when and what did the pharmacy advise?)  This is the patient's preferred pharmacy:   [facility] - [place], [place] - [address] [place] [address] Phone: [phone] Fax: [phone]  Is this the correct pharmacy for this prescription? Yes If no, delete pharmacy and type the correct one.   Has the prescription been filled recently? No  Is the patient out of the medication? Yes pravastatin , No omeprazole   Has the patient been seen for an appointment in the last year OR does the patient have an upcoming appointment? Yes  Can we respond through MyChart? Yes  Agent: Please be advised that Rx refills may take up to 3 business days. We ask that you follow-up with your pharmacy.

## 2024-06-25 DIAGNOSIS — M7542 Impingement syndrome of left shoulder: Secondary | ICD-10-CM | POA: Diagnosis not present

## 2024-07-06 ENCOUNTER — Ambulatory Visit (INDEPENDENT_AMBULATORY_CARE_PROVIDER_SITE_OTHER): Payer: Medicare (Managed Care) | Admitting: Family Medicine

## 2024-07-06 ENCOUNTER — Encounter: Payer: Self-pay | Admitting: Family Medicine

## 2024-07-06 VITALS — BP 118/74 | HR 89 | Resp 16 | Ht 60.0 in | Wt 126.9 lb

## 2024-07-06 DIAGNOSIS — G709 Myoneural disorder, unspecified: Secondary | ICD-10-CM | POA: Diagnosis not present

## 2024-07-06 DIAGNOSIS — J3089 Other allergic rhinitis: Secondary | ICD-10-CM | POA: Diagnosis not present

## 2024-07-06 DIAGNOSIS — E559 Vitamin D deficiency, unspecified: Secondary | ICD-10-CM

## 2024-07-06 DIAGNOSIS — J4540 Moderate persistent asthma, uncomplicated: Secondary | ICD-10-CM | POA: Diagnosis not present

## 2024-07-06 DIAGNOSIS — G2581 Restless legs syndrome: Secondary | ICD-10-CM

## 2024-07-06 DIAGNOSIS — G629 Polyneuropathy, unspecified: Secondary | ICD-10-CM | POA: Diagnosis not present

## 2024-07-06 DIAGNOSIS — I10 Essential (primary) hypertension: Secondary | ICD-10-CM | POA: Diagnosis not present

## 2024-07-06 DIAGNOSIS — K219 Gastro-esophageal reflux disease without esophagitis: Secondary | ICD-10-CM | POA: Diagnosis not present

## 2024-07-06 DIAGNOSIS — J45998 Other asthma: Secondary | ICD-10-CM

## 2024-07-06 DIAGNOSIS — E785 Hyperlipidemia, unspecified: Secondary | ICD-10-CM

## 2024-07-06 MED ORDER — PREGABALIN 25 MG PO CAPS: 25.0000 mg | ORAL_CAPSULE | Freq: Three times a day (TID) | ORAL | 0 refills | Status: AC

## 2024-07-06 MED ORDER — PRAVASTATIN SODIUM 20 MG PO TABS: 20.0000 mg | ORAL_TABLET | Freq: Every day | ORAL | 1 refills | Status: AC

## 2024-07-06 MED ORDER — OLMESARTAN MEDOXOMIL-HCTZ 40-25 MG PO TABS: 1.0000 | ORAL_TABLET | Freq: Every day | ORAL | 1 refills | Status: AC

## 2024-07-06 MED ORDER — OMEPRAZOLE 40 MG PO CPDR: 40.0000 mg | DELAYED_RELEASE_CAPSULE | Freq: Every day | ORAL | 1 refills | Status: AC

## 2024-07-06 NOTE — Progress Notes (Signed)
 Name: Ruth [name]   MRN: [id number]    DOB: 12/22/1945   Date:07/06/2024       Progress Note  Subjective  Chief Complaint  Chief Complaint  Patient presents with   Medical Management of Chronic Issues   Discussed the use of AI scribe software for clinical note transcription with the patient, who gave verbal consent to proceed.  History of Present Illness Ruth [name] is a 78 year old female with hypertension, moderate persistent asthma, and neuropathy who presents for follow-up of her chronic conditions.  She is currently taking omasertin HCTZ for hypertension. Her blood pressure is typically in the 120s at home, with occasional readings in the 130s. She experienced a headache yesterday, attributed to not eating much. No lightheadedness, chest pain, or shortness of breath.  She has moderate persistent asthma and uses Breo daily. She also takes montelukast  and levocetirizine daily. Her allergies, which previously caused a runny nose and congestion, have improved significantly since taking her medications daily.   She describes significant neuropathy pain, particularly at night, which has been severe enough to prompt her to attend a seminar where she was introduced to a TENS machine. However, she experienced adverse effects, including a rash and peeling skin, and has since stopped using the device. She has not been taking any medication for neuropathy pain recently but recalls previous use of gabapentin, which made her feel groggy. She wants to resume medication for neuropathy pain.  She has a history of dyslipidemia and is taking pravastatin . She recalls a previous increase in her LDL cholesterol levels. She is also taking a B12 complex supplement for a previously noted low B12 level.  She continues to take omeprazole  for reflux and heartburn, which she reports is well-controlled.  She mentions that she does not go out much, typically staying at home from Sunday to Sunday, except for  church activities.    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   Peripheral polyneuropathy 04/29/2022   Asthma, well controlled 04/29/2022   Moderate persistent asthma with acute exacerbation 12/26/2021   B12 deficiency 12/09/2018   Dyslipidemia 11/08/2017   Vitamin D  deficiency 10/31/2016   Osteopenia of left hip 10/31/2016   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   BPV (benign positional vertigo) 04/17/2016   RLS (restless legs syndrome) 04/17/2016   GERD (gastroesophageal reflux disease) 03/11/2016   History of hyperparathyroidism 03/11/2016   History of gastric ulcer 03/11/2016   Essential hypertension 04/24/2015   Primary osteoarthritis involving multiple joints 04/24/2015   Intermittent low back pain 04/24/2015   Family history of colonic polyps     Past Surgical History:  Procedure Laterality Date   BREAST BIOPSY Left 01/22/2023   stereo bx, distortion w/ calcs, RIBBON clip-path pending   BREAST BIOPSY Left 01/22/2023   MM LT BREAST BX W LOC DEV 1ST LESION IMAGE BX SPEC STEREO GUIDE 01/22/2023 [facility]   CARPAL TUNNEL RELEASE Bilateral 2000   CATARACT EXTRACTION W/PHACO Left 04/18/2020   Procedure: CATARACT EXTRACTION PHACO AND INTRAOCULAR LENS PLACEMENT (IOC) LEFT 4.78  00:32.8;  Surgeon: Jaye Fallow, MD;  Location: [facility];  Service: Ophthalmology;  Laterality: Left;   CATARACT EXTRACTION W/PHACO Right 05/09/2020   Procedure: CATARACT EXTRACTION PHACO AND INTRAOCULAR LENS PLACEMENT (IOC) RIGHT 3.41 00:21.8;  Surgeon: Jaye Fallow, MD;  Location: [facility];  Service: Ophthalmology;  Laterality: Right;   COLONOSCOPY  06/17/2012   A few two mm ulcers were  found in the sigmoid colon, no bleeding present,bx taken-path reporet on the few tiny ulcers ,non specific   COLONOSCOPY WITH PROPOFOL  N/A 06/11/2017   Procedure: COLONOSCOPY WITH PROPOFOL ;  Surgeon: Dellie Louanne MATSU, MD;   Location: [facility];  Service: Endoscopy;  Laterality: N/A;   COLONOSCOPY WITH PROPOFOL  N/A 08/08/2022   Procedure: COLONOSCOPY WITH PROPOFOL ;  Surgeon: Unk Corinn Skiff, MD;  Location: [facility];  Service: Endoscopy;  Laterality: N/A;   parathyroid  surgery  2008   TEMPOROMANDIBULAR JOINT ARTHROPLASTY Left 1984   1989,1990-bilateral   TUBAL LIGATION      Family History  Adopted: Yes  Problem Relation Age of Onset   Kidney disease Mother    Ovarian cancer Mother    Hypertension Father    Stroke Father    Lupus Sister    Breast cancer Maternal Grandmother    Breast cancer Cousin        pat cousins    Social History   Tobacco Use   Smoking status: Never   Smokeless tobacco: Never  Substance Use Topics   Alcohol use: Yes    Alcohol/week: 3.0 standard drinks of alcohol    Types: 3 Glasses of wine per week    Comment: wine     Current Outpatient Medications:    acetaminophen  (TYLENOL ) 500 MG tablet, Take 1-2 tablets (500-1,000 mg total) by mouth every 8 (eight) hours as needed., Disp: 100 tablet, Rfl: 2   albuterol  (VENTOLIN  HFA) 108 (90 Base) MCG/ACT inhaler, Inhale 2 puffs into the lungs every 4 (four) hours as needed for wheezing or shortness of breath., Disp: 18 g, Rfl: 1   Calcium -Vitamin D  600-200 MG-UNIT tablet, Take 1 tablet by mouth 2 (two) times daily. , Disp: , Rfl:    Cholecalciferol (VITAMIN D3) 1000 units CAPS, Take by mouth., Disp: , Rfl:    Cod Liver Oil 1000 MG CAPS, Take by mouth., Disp: , Rfl:    Coenzyme Q10 (COQ10) 200 MG CAPS, Take 200 mg by mouth daily., Disp: , Rfl:    Garlic 1000 MG CAPS, Take 1,000 mg by mouth. , Disp: , Rfl:    L-Lysine 500 MG TABS, Take by mouth., Disp: , Rfl:    levocetirizine (XYZAL ) 5 MG tablet, TAKE 1 TABLET BY MOUTH ONCE DAILY IN THE EVENING, Disp: 90 tablet, Rfl: 0   magnesium oxide (MAG-OX) 400 MG tablet, Take 500 mg by mouth daily. , Disp: , Rfl:    montelukast  (SINGULAIR ) 10 MG tablet, TAKE 1 TABLET BY  MOUTH AT BEDTIME, Disp: 90 tablet, Rfl: 0   Nutritional Supplements (JOINT FORMULA PO), Take 2 tablets by mouth daily. glucosamine, Disp: , Rfl:    olmesartan -hydrochlorothiazide  (BENICAR  HCT) 40-25 MG tablet, Take 1 tablet by mouth daily., Disp: 90 tablet, Rfl: 1   omeprazole  (PRILOSEC) 40 MG capsule, Take 1 capsule (40 mg total) by mouth daily., Disp: 90 capsule, Rfl: 1   pravastatin  (PRAVACHOL ) 20 MG tablet, Take 1 tablet (20 mg total) by mouth daily., Disp: 90 tablet, Rfl: 1   triamcinolone cream (KENALOG) 0.1 %, Apply 1 Application topically 2 (two) times daily., Disp: 60 g, Rfl: 0   fluticasone  furoate-vilanterol (BREO ELLIPTA ) 200-25 MCG/ACT AEPB, Inhale 1 puff into the lungs daily. (Patient not taking: Reported on 07/06/2024), Disp: 60 each, Rfl: 1   mometasone  (NASONEX ) 50 MCG/ACT nasal spray, Place 2 sprays into the nose daily. (Patient not taking: Reported on 07/06/2024), Disp: 1 each, Rfl: 12  Allergies  Allergen Reactions   Ace Inhibitors  Cough   Aspirin Nausea Only    Abdominal pain   Crestor  [Rosuvastatin ]     Muscle pain     I personally reviewed active problem list, medication list, allergies with the patient/caregiver today.   ROS  Ten systems reviewed and is negative except as mentioned in HPI    Objective Physical Exam VITALS: BP- 118/74 CONSTITUTIONAL: Patient appears well-developed and well-nourished. No distress. HEENT: Head atraumatic, normocephalic, neck supple. CARDIOVASCULAR: Normal rate, regular rhythm and normal heart sounds. No murmur heard. No BLE edema. PULMONARY: Effort normal with mild bronchial sounds. No respiratory distress. ABDOMINAL: There is no tenderness or distention. MUSCULOSKELETAL: Normal gait. Without gross motor or sensory deficit. PSYCHIATRIC: Patient has a normal mood and affect. Behavior is normal. Judgment and thought content normal.  Vitals:   07/06/24 0833  BP: 118/74  Pulse: 89  Resp: 16  SpO2: 99%  Weight: 126 lb 14.4 oz  (57.6 kg)  Height: 5' (1.524 m)    Body mass index is 24.78 kg/m.   PHQ2/9:    07/06/2024    8:28 AM 01/08/2024   11:05 AM 01/06/2024    8:35 AM 11/06/2023    1:41 PM 07/15/2023    8:41 AM  Depression screen PHQ 2/9  Decreased Interest 0 0 0 0 0  Down, Depressed, Hopeless 0 0 0 0 0  PHQ - 2 Score 0 0 0 0 0  Altered sleeping  0  0 0  Tired, decreased energy  0  0 0  Change in appetite  0  0 0  Feeling bad or failure about yourself   0  0 0  Trouble concentrating  0  0 0  Moving slowly or fidgety/restless  0  0 0  Suicidal thoughts  0  0 0  PHQ-9 Score  0  0 0  Difficult doing work/chores  Not difficult at all  Not difficult at all     phq 9 is negative  Fall Risk:    07/06/2024    8:28 AM 01/08/2024   11:08 AM 01/06/2024    8:35 AM 11/06/2023    1:41 PM 07/15/2023    8:40 AM  Fall Risk   Falls in the past year? 0 0 0 0 0  Number falls in past yr: 0 0 0 0 0  Injury with Fall? 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 Chronic bronchitis present. - Continue Breo daily. - Continue montelukast  daily. - Continue levocetirizine daily. - Follow up with Dr. Frutoso for asthma and allergy management.  Polyneuropathy and restless legs syndrome Severe neuropathy pain, adverse reactions to TENS. Restless legs present. Discussed pregabalin  initiation. - Start pregabalin  25 mg at night, increase to 75 mg as tolerated. - Consider referral to neurologist for further evaluation.  Essential hypertension Blood pressure well-controlled, no symptoms of hypotension. -  Continue omasertin HCTZ. - Monitor blood pressure at home.  Hyperlipidemia LDL elevated, HDL low. Discussed dietary modifications. - Continue pravastatin . - Increase intake of fish and tree nuts. - Recheck lipid panel next year.  Gastroesophageal reflux disease (GERD) GERD managed with omeprazole . Prefers local pharmacy  pickup. - Send omeprazole  prescription to [facility] for local pickup.  Vitamin D  deficiency - Continue vitamin D  supplementation.

## 2025-01-04 ENCOUNTER — Ambulatory Visit: Payer: Medicare (Managed Care) | Admitting: Family Medicine

## 2025-01-13 ENCOUNTER — Ambulatory Visit
